# Patient Record
Sex: Female | Born: 1970 | Race: White | Hispanic: No | Marital: Married | Smoking: Current every day smoker
Health system: Southern US, Community
[De-identification: ages and names within clinical notes are randomized; demographics above are authoritative.]

## PROBLEM LIST (undated history)

## (undated) DIAGNOSIS — K589 Irritable bowel syndrome without diarrhea: Secondary | ICD-10-CM

## (undated) DIAGNOSIS — C439 Malignant melanoma of skin, unspecified: Secondary | ICD-10-CM

## (undated) DIAGNOSIS — K2970 Gastritis, unspecified, without bleeding: Secondary | ICD-10-CM

## (undated) DIAGNOSIS — T7840XA Allergy, unspecified, initial encounter: Secondary | ICD-10-CM

## (undated) DIAGNOSIS — I10 Essential (primary) hypertension: Secondary | ICD-10-CM

## (undated) DIAGNOSIS — M6740 Ganglion, unspecified site: Secondary | ICD-10-CM

## (undated) DIAGNOSIS — Z860100 Personal history of colon polyps, unspecified: Secondary | ICD-10-CM

## (undated) DIAGNOSIS — K5730 Diverticulosis of large intestine without perforation or abscess without bleeding: Secondary | ICD-10-CM

## (undated) HISTORY — PX: SPINAL FUSION: SHX223

## (undated) HISTORY — DX: Gastritis, unspecified, without bleeding: K29.70

## (undated) HISTORY — PX: CARPAL TUNNEL RELEASE: SHX101

## (undated) HISTORY — DX: Essential (primary) hypertension: I10

## (undated) HISTORY — DX: Allergy, unspecified, initial encounter: T78.40XA

## (undated) HISTORY — DX: Personal history of colonic polyps: Z86.010

## (undated) HISTORY — PX: FOOT SURGERY: SHX648

## (undated) HISTORY — DX: Diverticulosis of large intestine without perforation or abscess without bleeding: K57.30

## (undated) HISTORY — DX: Irritable bowel syndrome, unspecified: K58.9

## (undated) HISTORY — DX: Malignant melanoma of skin, unspecified: C43.9

## (undated) HISTORY — DX: Ganglion, unspecified site: M67.40

## (undated) HISTORY — PX: WISDOM TOOTH EXTRACTION: SHX21

## (undated) HISTORY — DX: Personal history of colon polyps, unspecified: Z86.0100

---

## 1997-10-29 HISTORY — PX: ABDOMINAL HYSTERECTOMY: SHX81

## 1998-10-05 ENCOUNTER — Inpatient Hospital Stay (HOSPITAL_COMMUNITY): Admission: RE | Admit: 1998-10-05 | Discharge: 1998-10-07 | Payer: Self-pay | Admitting: Gynecology

## 2001-02-25 ENCOUNTER — Other Ambulatory Visit: Admission: RE | Admit: 2001-02-25 | Discharge: 2001-02-25 | Payer: Self-pay | Admitting: Gynecology

## 2002-05-12 ENCOUNTER — Emergency Department (HOSPITAL_COMMUNITY): Admission: EM | Admit: 2002-05-12 | Discharge: 2002-05-12 | Payer: Self-pay | Admitting: Emergency Medicine

## 2002-09-21 ENCOUNTER — Other Ambulatory Visit: Admission: RE | Admit: 2002-09-21 | Discharge: 2002-09-21 | Payer: Self-pay | Admitting: Gynecology

## 2003-07-02 ENCOUNTER — Ambulatory Visit (HOSPITAL_COMMUNITY): Admission: RE | Admit: 2003-07-02 | Discharge: 2003-07-02 | Payer: Self-pay | Admitting: Orthopedic Surgery

## 2003-12-22 ENCOUNTER — Ambulatory Visit (HOSPITAL_COMMUNITY): Admission: RE | Admit: 2003-12-22 | Discharge: 2003-12-22 | Payer: Self-pay | Admitting: Internal Medicine

## 2004-01-05 ENCOUNTER — Other Ambulatory Visit: Admission: RE | Admit: 2004-01-05 | Discharge: 2004-01-05 | Payer: Self-pay | Admitting: Gynecology

## 2004-10-27 ENCOUNTER — Ambulatory Visit (HOSPITAL_COMMUNITY): Admission: RE | Admit: 2004-10-27 | Discharge: 2004-10-27 | Payer: Self-pay | Admitting: Family Medicine

## 2004-10-29 DIAGNOSIS — C439 Malignant melanoma of skin, unspecified: Secondary | ICD-10-CM

## 2004-10-29 HISTORY — PX: MELANOMA EXCISION: SHX5266

## 2004-10-29 HISTORY — DX: Malignant melanoma of skin, unspecified: C43.9

## 2005-01-05 ENCOUNTER — Emergency Department (HOSPITAL_COMMUNITY): Admission: EM | Admit: 2005-01-05 | Discharge: 2005-01-05 | Payer: Self-pay | Admitting: *Deleted

## 2005-01-08 ENCOUNTER — Ambulatory Visit (HOSPITAL_COMMUNITY): Admission: RE | Admit: 2005-01-08 | Discharge: 2005-01-08 | Payer: Self-pay | Admitting: *Deleted

## 2005-08-14 ENCOUNTER — Other Ambulatory Visit: Admission: RE | Admit: 2005-08-14 | Discharge: 2005-08-14 | Payer: Self-pay | Admitting: Gynecology

## 2005-11-08 ENCOUNTER — Ambulatory Visit: Payer: Self-pay | Admitting: Orthopedic Surgery

## 2005-11-28 ENCOUNTER — Ambulatory Visit: Payer: Self-pay | Admitting: Orthopedic Surgery

## 2005-12-07 ENCOUNTER — Encounter: Payer: Self-pay | Admitting: Orthopedic Surgery

## 2005-12-07 ENCOUNTER — Ambulatory Visit (HOSPITAL_COMMUNITY): Admission: RE | Admit: 2005-12-07 | Discharge: 2005-12-07 | Payer: Self-pay | Admitting: Orthopedic Surgery

## 2005-12-07 ENCOUNTER — Ambulatory Visit: Payer: Self-pay | Admitting: Orthopedic Surgery

## 2005-12-10 ENCOUNTER — Ambulatory Visit: Payer: Self-pay | Admitting: Orthopedic Surgery

## 2005-12-16 ENCOUNTER — Emergency Department (HOSPITAL_COMMUNITY): Admission: EM | Admit: 2005-12-16 | Discharge: 2005-12-16 | Payer: Self-pay | Admitting: Emergency Medicine

## 2005-12-19 ENCOUNTER — Ambulatory Visit: Payer: Self-pay | Admitting: Orthopedic Surgery

## 2005-12-24 ENCOUNTER — Ambulatory Visit: Payer: Self-pay | Admitting: Orthopedic Surgery

## 2006-01-07 ENCOUNTER — Ambulatory Visit: Payer: Self-pay | Admitting: Orthopedic Surgery

## 2006-01-23 ENCOUNTER — Ambulatory Visit (HOSPITAL_COMMUNITY): Admission: RE | Admit: 2006-01-23 | Discharge: 2006-01-23 | Payer: Self-pay | Admitting: Family Medicine

## 2006-02-11 ENCOUNTER — Ambulatory Visit: Payer: Self-pay | Admitting: Orthopedic Surgery

## 2006-03-28 ENCOUNTER — Ambulatory Visit: Payer: Self-pay | Admitting: Internal Medicine

## 2006-09-24 ENCOUNTER — Encounter: Admission: RE | Admit: 2006-09-24 | Discharge: 2006-09-24 | Payer: Self-pay | Admitting: Orthopaedic Surgery

## 2006-10-30 ENCOUNTER — Ambulatory Visit (HOSPITAL_COMMUNITY): Admission: RE | Admit: 2006-10-30 | Discharge: 2006-10-30 | Payer: Self-pay | Admitting: Family Medicine

## 2007-01-08 ENCOUNTER — Ambulatory Visit (HOSPITAL_COMMUNITY): Admission: RE | Admit: 2007-01-08 | Discharge: 2007-01-08 | Payer: Self-pay | Admitting: Neurosurgery

## 2007-01-31 ENCOUNTER — Inpatient Hospital Stay (HOSPITAL_COMMUNITY): Admission: RE | Admit: 2007-01-31 | Discharge: 2007-02-04 | Payer: Self-pay | Admitting: Neurosurgery

## 2007-11-11 ENCOUNTER — Ambulatory Visit (HOSPITAL_COMMUNITY): Admission: RE | Admit: 2007-11-11 | Discharge: 2007-11-11 | Payer: Self-pay | Admitting: Family Medicine

## 2008-03-03 ENCOUNTER — Encounter: Admission: RE | Admit: 2008-03-03 | Discharge: 2008-03-03 | Payer: Self-pay | Admitting: Neurosurgery

## 2008-06-28 ENCOUNTER — Encounter (HOSPITAL_COMMUNITY): Admission: RE | Admit: 2008-06-28 | Discharge: 2008-07-28 | Payer: Self-pay | Admitting: Oncology

## 2008-06-28 ENCOUNTER — Ambulatory Visit (HOSPITAL_COMMUNITY): Payer: Self-pay | Admitting: Oncology

## 2008-08-06 ENCOUNTER — Encounter (HOSPITAL_COMMUNITY): Admission: RE | Admit: 2008-08-06 | Discharge: 2008-09-05 | Payer: Self-pay | Admitting: Oncology

## 2008-08-30 ENCOUNTER — Emergency Department (HOSPITAL_COMMUNITY): Admission: EM | Admit: 2008-08-30 | Discharge: 2008-08-30 | Payer: Self-pay | Admitting: Emergency Medicine

## 2008-09-15 IMAGING — CT CT L SPINE W/O CM
2 of 9 series · 8 of 30 positions shown, 9 images · IV contrast (agent unspecified)
Comparison: MR 01/08/2007.  
Last fully open disk space was labeled L5-S1.

CLINICAL DATA: Low back pain radiating down left hip and leg.  
LUMBAR SPINE CT WITHOUT CONTRAST:
TECHNIQUE: Multidetector CT imaging of the lumbar spine was performed.  Multiplanar CT image reconstructions were also generated.

[Series 3: recon 2: l-spine helical · axial · 0.27mm/px · z∈[-293,-230]mm · 2 of 76 slices shown, 3 images]
[im 26/76  soft-tissue]
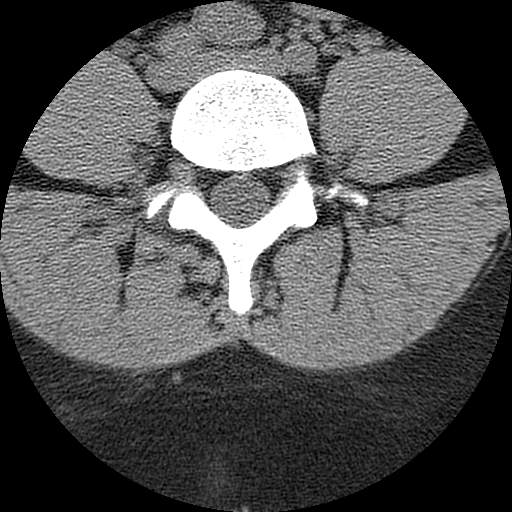
[im 26/76  bone]
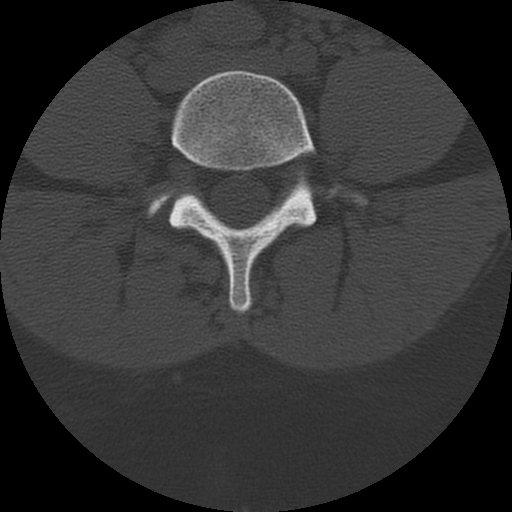
[im 51/76  bone]
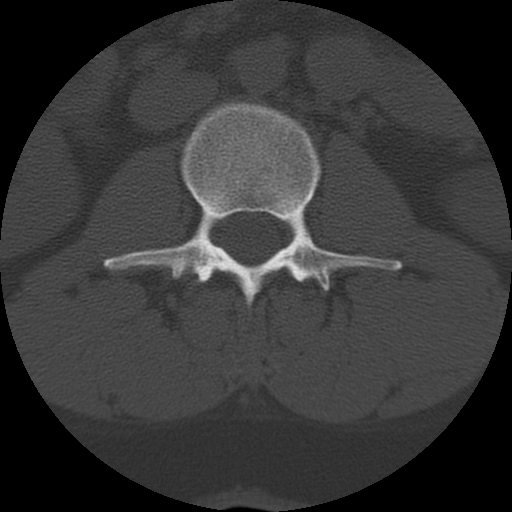

[Series 401: coronal l -spine · coronal · 0.37mm/px · 6 of 40 slices shown]
[im 12/40  soft-tissue]
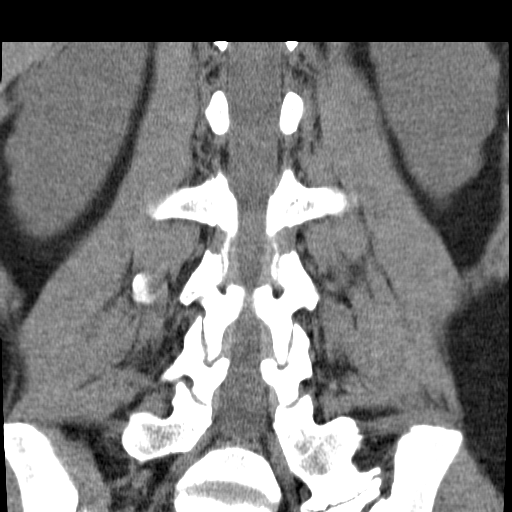
[im 14/40  bone]
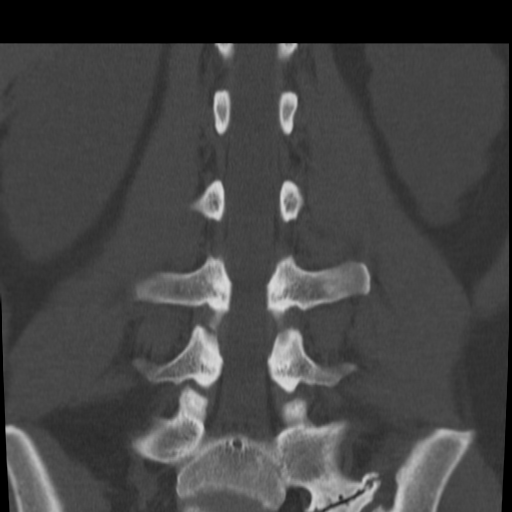
[im 17/40  bone]
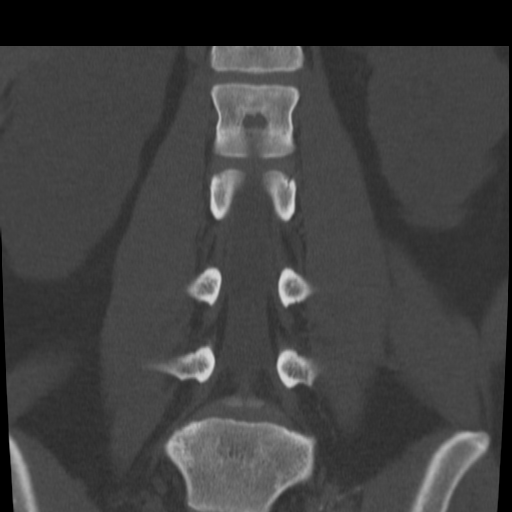
[im 20/40  bone]
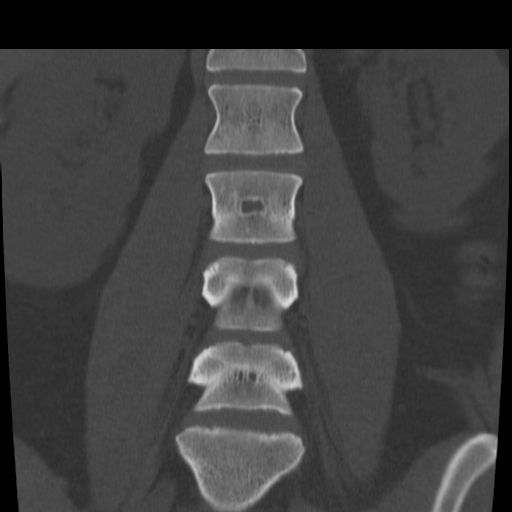
[im 23/40  bone]
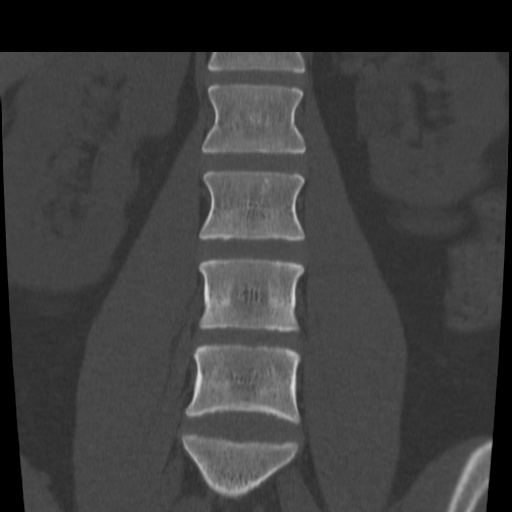
[im 27/40  bone]
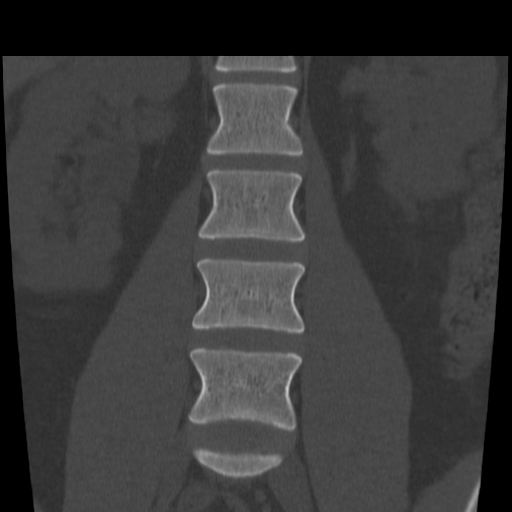

[8 of 30 positions shown; findings below may reference images not displayed]

FINDINGS: The decreased signal intensity of bone marrow noted on pre-contrast T1-weighted MR imaging is not appreciated on the present CT scan.  One could correlate with CBC and differential to exclude anemia or infiltrative process.  This appearance may be explained by the patient?s habitus.  The rounded bony lesions within the L3 and L4 vertebral body also better delineated on MR. These do not have characteristics of typical hemangiomas but possibly represent atypical hemangiomas.  
No pars defect. The left aspect of the L5 vertebra articulates with the upper aspect of the sacrum with broad-based osteophyte.  This may contribute to the patient?s symptoms.  The exiting left L5 nerve root is immediately adjacent to this region but does not appear compressed.  
T12-L1 through L3-4 unremarkable.  
L4-5:
IMPRESSION: 1.  Left aspect of L5 articulates with the upper sacrum with bony overgrowth at this articulation potentially contributing to patient?s discomfort.
2.  No disk herniation.  
3.  Slight altered signal of bone marrow better visualized on recent MR scan as described above.

## 2008-09-27 ENCOUNTER — Ambulatory Visit (HOSPITAL_COMMUNITY): Payer: Self-pay | Admitting: Oncology

## 2008-09-27 ENCOUNTER — Encounter (HOSPITAL_COMMUNITY): Admission: RE | Admit: 2008-09-27 | Discharge: 2008-10-26 | Payer: Self-pay | Admitting: Oncology

## 2008-11-30 ENCOUNTER — Inpatient Hospital Stay (HOSPITAL_COMMUNITY): Admission: EM | Admit: 2008-11-30 | Discharge: 2008-12-02 | Payer: Self-pay | Admitting: Emergency Medicine

## 2008-12-01 ENCOUNTER — Ambulatory Visit: Payer: Self-pay | Admitting: Gastroenterology

## 2008-12-02 ENCOUNTER — Encounter: Payer: Self-pay | Admitting: Internal Medicine

## 2008-12-02 ENCOUNTER — Ambulatory Visit: Payer: Self-pay | Admitting: Internal Medicine

## 2008-12-23 ENCOUNTER — Encounter: Payer: Self-pay | Admitting: Gastroenterology

## 2009-01-03 ENCOUNTER — Ambulatory Visit (HOSPITAL_COMMUNITY): Admission: RE | Admit: 2009-01-03 | Discharge: 2009-01-03 | Payer: Self-pay | Admitting: Family Medicine

## 2009-01-17 DIAGNOSIS — Z860100 Personal history of colon polyps, unspecified: Secondary | ICD-10-CM | POA: Insufficient documentation

## 2009-01-17 DIAGNOSIS — I10 Essential (primary) hypertension: Secondary | ICD-10-CM | POA: Insufficient documentation

## 2009-01-18 ENCOUNTER — Ambulatory Visit: Payer: Self-pay | Admitting: Internal Medicine

## 2009-01-18 DIAGNOSIS — Z8719 Personal history of other diseases of the digestive system: Secondary | ICD-10-CM | POA: Insufficient documentation

## 2009-01-26 ENCOUNTER — Encounter: Payer: Self-pay | Admitting: Internal Medicine

## 2009-01-26 ENCOUNTER — Telehealth (INDEPENDENT_AMBULATORY_CARE_PROVIDER_SITE_OTHER): Payer: Self-pay | Admitting: *Deleted

## 2009-01-26 DIAGNOSIS — Z9189 Other specified personal risk factors, not elsewhere classified: Secondary | ICD-10-CM | POA: Insufficient documentation

## 2009-02-01 ENCOUNTER — Ambulatory Visit (HOSPITAL_COMMUNITY): Admission: RE | Admit: 2009-02-01 | Discharge: 2009-02-01 | Payer: Self-pay | Admitting: Internal Medicine

## 2009-04-01 ENCOUNTER — Ambulatory Visit (HOSPITAL_COMMUNITY): Payer: Self-pay | Admitting: Oncology

## 2009-05-06 ENCOUNTER — Encounter (HOSPITAL_COMMUNITY): Admission: RE | Admit: 2009-05-06 | Discharge: 2009-06-05 | Payer: Self-pay | Admitting: Oncology

## 2009-07-12 ENCOUNTER — Encounter: Admission: RE | Admit: 2009-07-12 | Discharge: 2009-07-12 | Payer: Self-pay | Admitting: Neurosurgery

## 2009-08-02 ENCOUNTER — Encounter: Admission: RE | Admit: 2009-08-02 | Discharge: 2009-08-02 | Payer: Self-pay | Admitting: Neurosurgery

## 2009-11-14 ENCOUNTER — Ambulatory Visit (HOSPITAL_COMMUNITY): Admission: RE | Admit: 2009-11-14 | Discharge: 2009-11-14 | Payer: Self-pay | Admitting: Family Medicine

## 2010-03-06 ENCOUNTER — Encounter (HOSPITAL_COMMUNITY): Admission: RE | Admit: 2010-03-06 | Discharge: 2010-04-05 | Payer: Self-pay | Admitting: Oncology

## 2010-03-06 ENCOUNTER — Ambulatory Visit (HOSPITAL_COMMUNITY): Payer: Self-pay | Admitting: Internal Medicine

## 2010-05-08 IMAGING — CR DG CHEST 2V
2 series · 2 of 2 positions shown · non-contrast
Comparison: 07/08/2008

CLINICAL DATA: Chest pain and shortness of breath.

CHEST - 2 VIEW

[w chest pa]
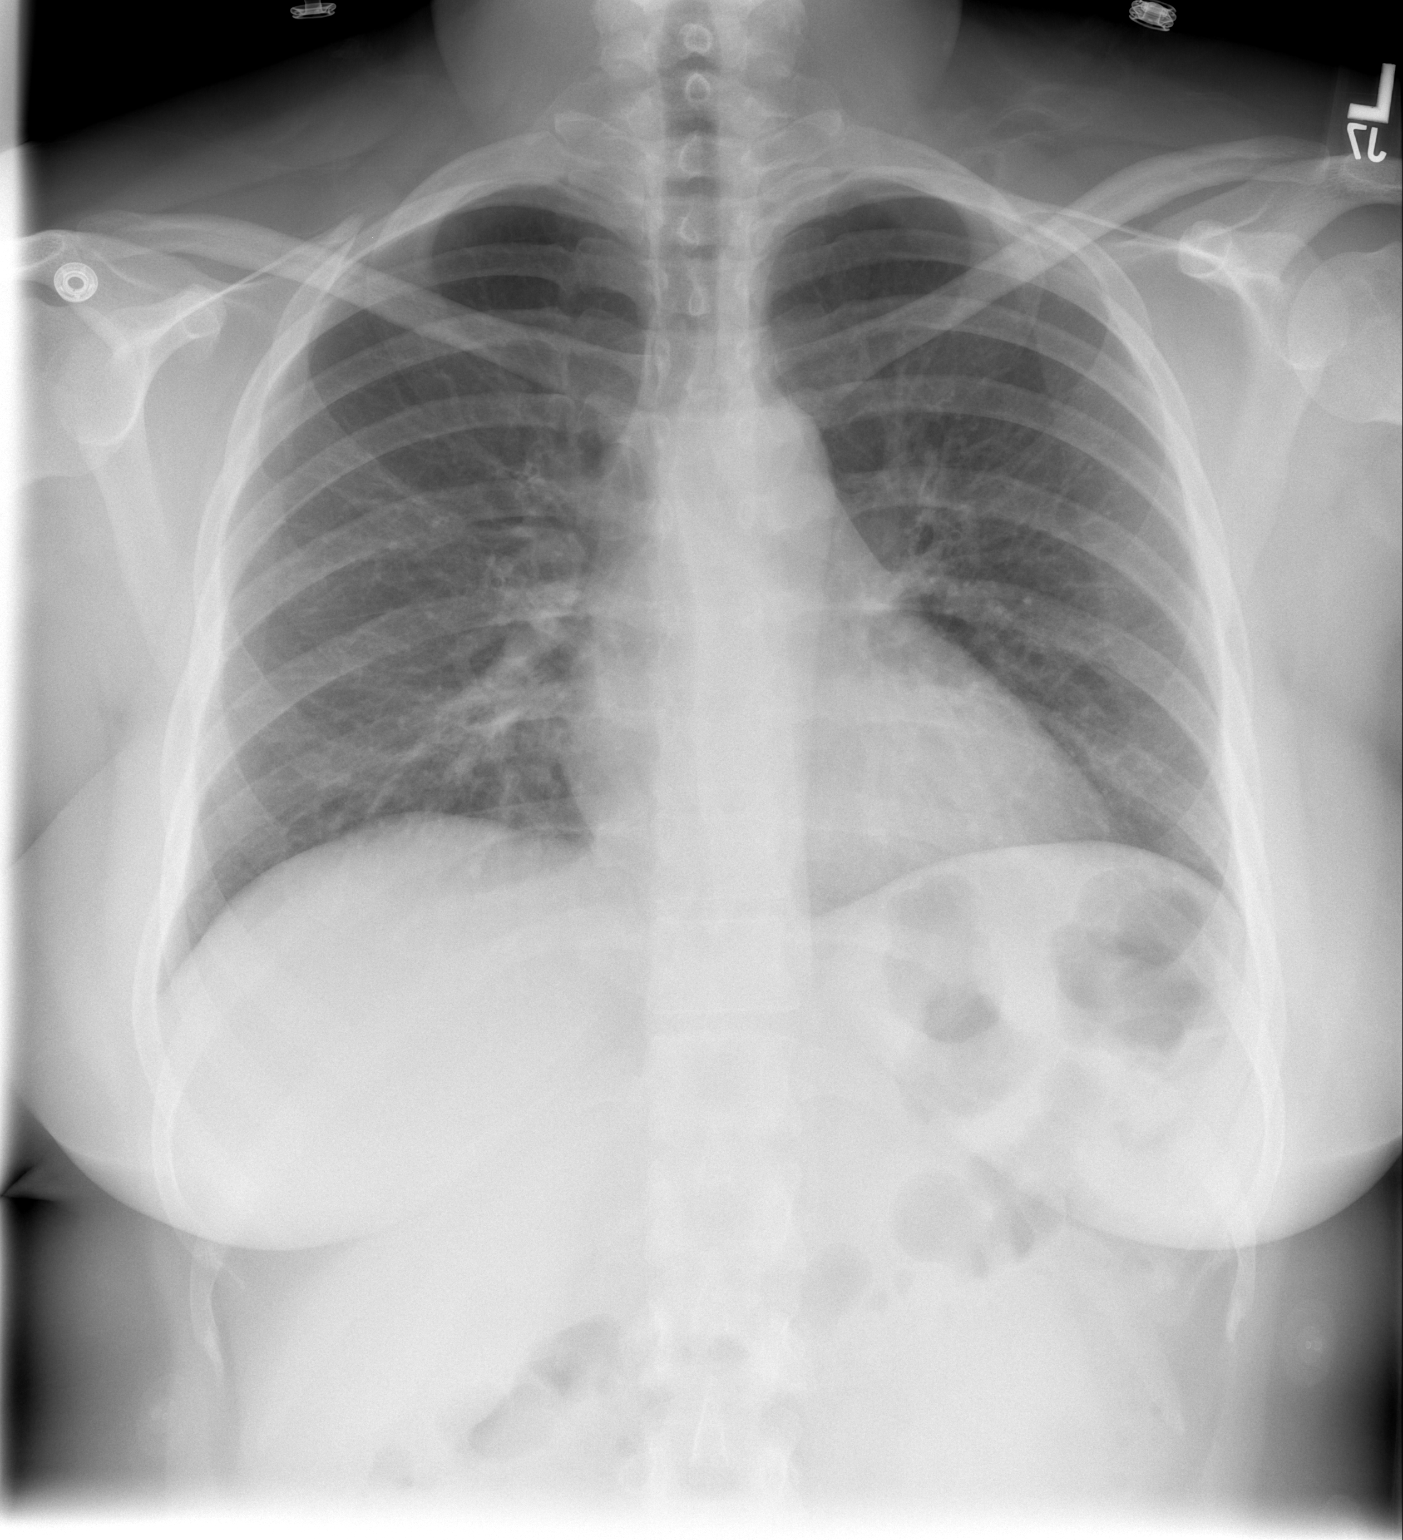

[w chest lat]
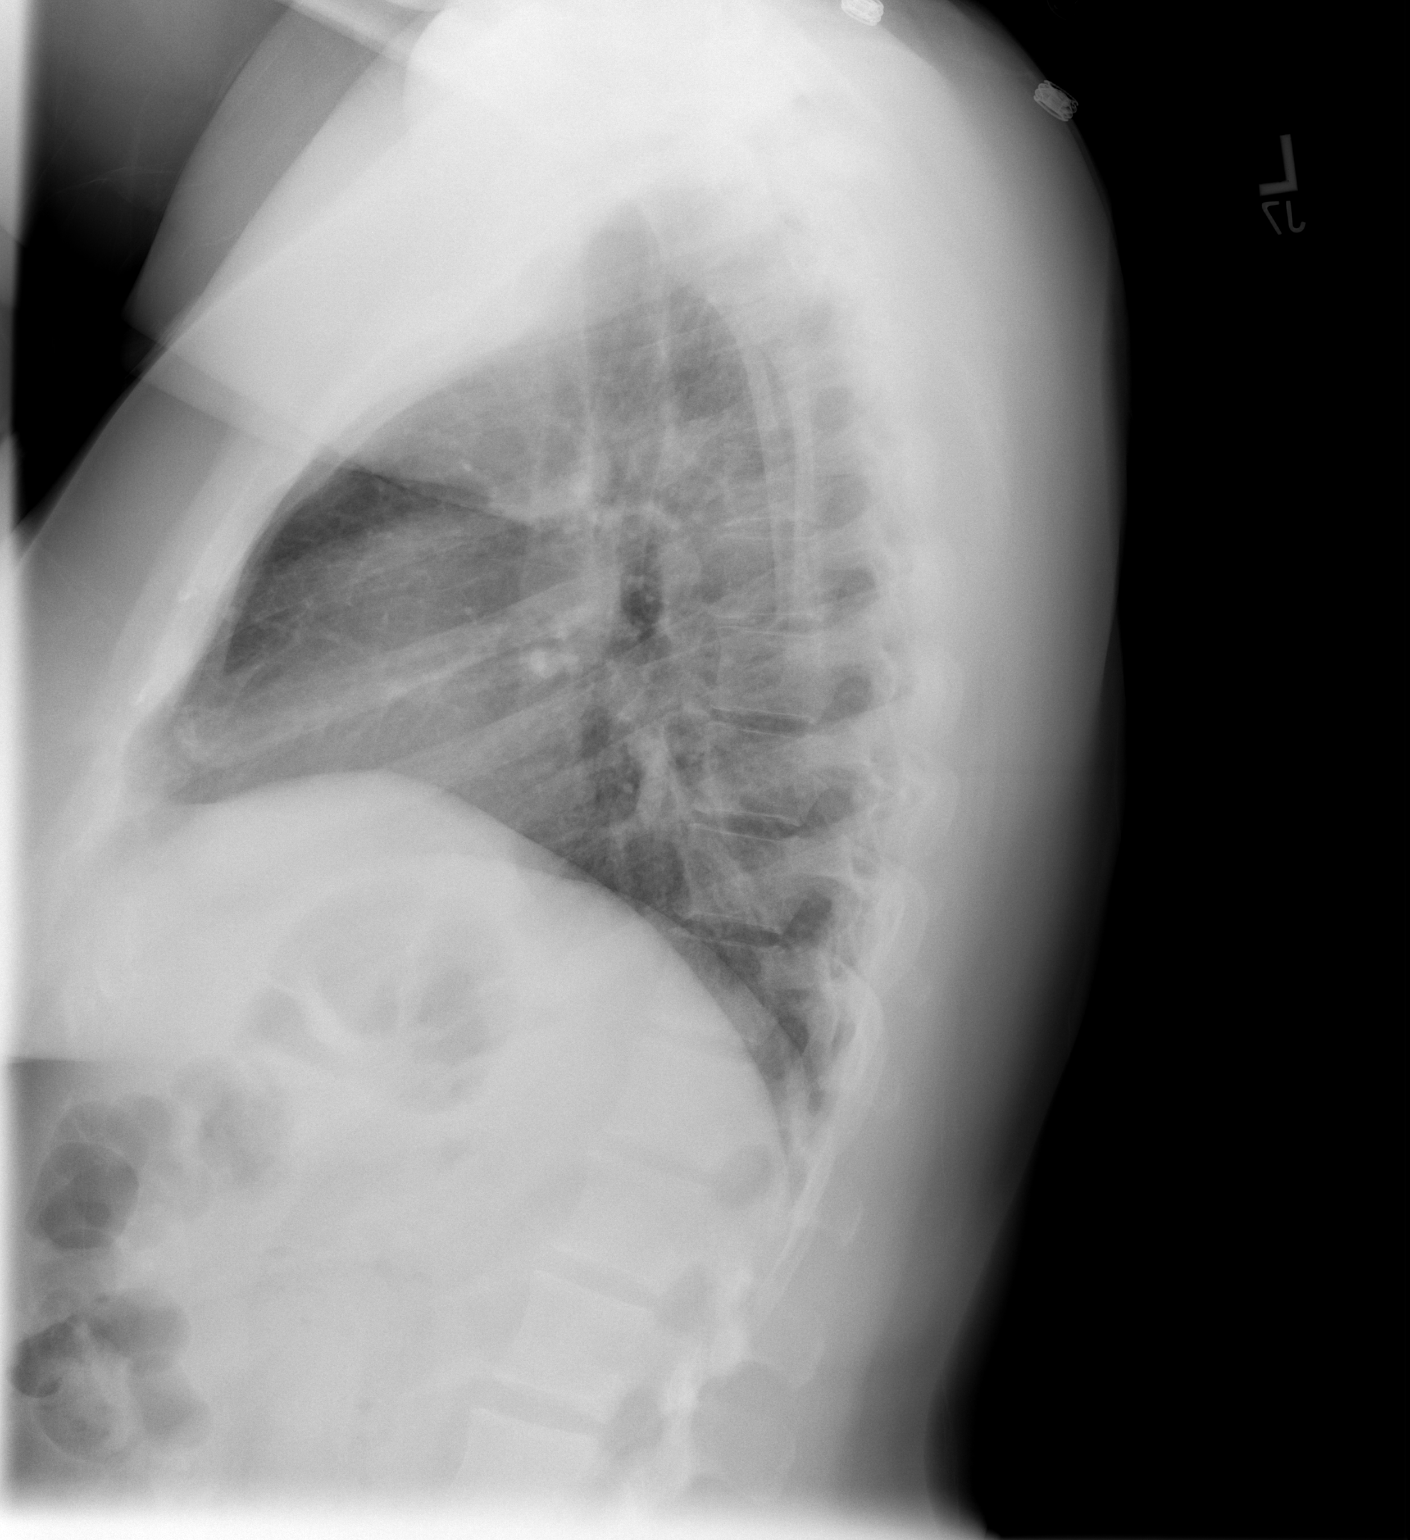

[2 of 2 positions shown; findings below may reference images not displayed]

FINDINGS: Trachea is midline.  Heart size normal.  Lungs are
somewhat low in volume but clear.  No pleural fluid.
IMPRESSION: No acute findings.

## 2010-11-19 ENCOUNTER — Encounter: Payer: Self-pay | Admitting: Neurosurgery

## 2010-11-20 ENCOUNTER — Encounter: Payer: Self-pay | Admitting: Neurosurgery

## 2011-01-16 LAB — DIFFERENTIAL
Lymphocytes Relative: 32 % (ref 12–46)
Lymphs Abs: 3 10*3/uL (ref 0.7–4.0)
Monocytes Absolute: 0.7 10*3/uL (ref 0.1–1.0)
Monocytes Relative: 8 % (ref 3–12)
Neutro Abs: 5.3 10*3/uL (ref 1.7–7.7)

## 2011-01-16 LAB — CBC
HCT: 43.1 % (ref 36.0–46.0)
Hemoglobin: 15.1 g/dL — ABNORMAL HIGH (ref 12.0–15.0)
RBC: 4.79 MIL/uL (ref 3.87–5.11)

## 2011-02-04 LAB — CBC
HCT: 41.5 % (ref 36.0–46.0)
Hemoglobin: 14.4 g/dL (ref 12.0–15.0)
MCHC: 34.6 g/dL (ref 30.0–36.0)
MCV: 91.2 fL (ref 78.0–100.0)
RDW: 13.8 % (ref 11.5–15.5)

## 2011-02-04 LAB — DIFFERENTIAL
Basophils Absolute: 0.1 10*3/uL (ref 0.0–0.1)
Basophils Relative: 1 % (ref 0–1)
Eosinophils Absolute: 0.1 10*3/uL (ref 0.0–0.7)
Eosinophils Relative: 1 % (ref 0–5)
Lymphocytes Relative: 24 % (ref 12–46)
Monocytes Absolute: 0.7 10*3/uL (ref 0.1–1.0)

## 2011-02-13 LAB — URINALYSIS, ROUTINE W REFLEX MICROSCOPIC
Leukocytes, UA: NEGATIVE
Nitrite: NEGATIVE
Specific Gravity, Urine: 1.01 (ref 1.005–1.030)
pH: 5.5 (ref 5.0–8.0)

## 2011-02-13 LAB — DIFFERENTIAL
Basophils Absolute: 0 10*3/uL (ref 0.0–0.1)
Basophils Relative: 0 % (ref 0–1)
Eosinophils Relative: 1 % (ref 0–5)
Lymphocytes Relative: 20 % (ref 12–46)
Lymphocytes Relative: 24 % (ref 12–46)
Monocytes Absolute: 0.7 10*3/uL (ref 0.1–1.0)
Monocytes Relative: 5 % (ref 3–12)
Neutro Abs: 10.3 10*3/uL — ABNORMAL HIGH (ref 1.7–7.7)
Neutro Abs: 9.8 10*3/uL — ABNORMAL HIGH (ref 1.7–7.7)

## 2011-02-13 LAB — URINE MICROSCOPIC-ADD ON

## 2011-02-13 LAB — BASIC METABOLIC PANEL
BUN: 10 mg/dL (ref 6–23)
Calcium: 8.9 mg/dL (ref 8.4–10.5)
GFR calc non Af Amer: >60 mL/min (ref >60)
Glucose, Bld: 102 mg/dL — ABNORMAL HIGH (ref 70–99)

## 2011-02-13 LAB — CBC
HCT: 36.2 % (ref 36.0–46.0)
HCT: 42.5 % (ref 36.0–46.0)
Platelets: 315 10*3/uL (ref 150–400)
Platelets: 338 10*3/uL (ref 150–400)
RDW: 13.3 % (ref 11.5–15.5)
RDW: 13.4 % (ref 11.5–15.5)

## 2011-02-13 LAB — COMPREHENSIVE METABOLIC PANEL
Albumin: 3.9 g/dL (ref 3.5–5.2)
Alkaline Phosphatase: 84 U/L (ref 39–117)
BUN: 10 mg/dL (ref 6–23)
Potassium: 3.3 mEq/L — ABNORMAL LOW (ref 3.5–5.1)
Total Protein: 6.8 g/dL (ref 6.0–8.3)

## 2011-02-13 LAB — APTT: aPTT: 38 seconds — ABNORMAL HIGH (ref 24–37)

## 2011-02-13 LAB — POCT I-STAT 4, (NA,K, GLUC, HGB,HCT)
HCT: 41 % (ref 36.0–46.0)
Sodium: 142 mEq/L (ref 135–145)

## 2011-03-13 NOTE — H&P (Signed)
NAMESHERENE, PLANCARTE                 ACCOUNT NO.:  [id number]   MEDICAL RECORD NO.:  [id number]          PATIENT TYPE:  INP   LOCATION:  A321                          FACILITY:  [facility]   PHYSICIAN:  Margaretmary Dys, M.D.DATE OF BIRTH:  10-21-1971   DATE OF ADMISSION:  11/30/2008  DATE OF DISCHARGE:  LH                              HISTORY & PHYSICAL   PRIMARY CARE PHYSICIAN:  Unassigned.   ADMISSION DIAGNOSIS:  Gastrointestinal bleed.   CHIEF COMPLAINT:  Bright red blood per rectum x1 day.   HISTORY OF PRESENT ILLNESS:  Rhonda Porter Porter is a 40 year old female who  presented to the emergency room with a history of one episode of bright  red blood this afternoon.   She reports going to the bathroom and the feeling an urge to go.  She  thought she had diarrhea.  When she looked and in toilet bowl, she saw  bright red blood.  She estimated it to be about 3-4 ounces.  She had no  abdominal cramping.  She denied any pain.  This is her first episode.  She has never had bleeding per rectum.  She denies any dizziness or  lightheadedness.  She had no abdominal pain.  The patient called her  husband and he came to the emergency room.  Since being in the emergency  room, the patient has not had any more episodes of bleeding.  The  patient gives a history of chronic constipation of several years for  which she takes Fiber One.  She also reports taking Advil for back pain.   The patient denies any history of melena stools or hematemesis.  The  patient has never been told she is anemic for no clear reason.  She  denies any weight loss.  She has no night sweats.  No fevers or chills.   Evaluation in the emergency room revealed the patient was  hemodynamically stable.  Hemoglobin and hematocrit was also pretty  normal.  The patient has now been admitted for further evaluation.   REVIEW OF SYSTEMS:  A 10-point review of systems otherwise negative,  except as mentioned in the history of present  illness.   PAST MEDICAL HISTORY:  1. 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      The patient is being followed by Dr. Mariel Sleet.  2. 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  3. History of breast biopsy which was noted to be benign.  4. History of partial hysterectomy in 1999, secondary to severe      endometriosis.   MEDICATIONS:  1. Fiber One as needed.  2. Advil orally.   ALLERGIES:  NO KNOWN DRUG ALLERGIES.   FAMILY HISTORY:  Positive for grandmother with breast cancer.  No  history of colon cancer or bowel cancer in the family.   SOCIAL HISTORY:  The patient is married, has two children, a 39 year old  and a 71 year old.  She has been married for 13 years and she smokes  about three-quarters of a pack of cigarettes a day.  She denies any  alcohol use.  Denies any illicit drug use.  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   PHYSICAL EXAMINATION:  GENERAL:  The patient was conscious, alert,  comfortable, not in acute distress.  Well oriented in time, place and  person.  VITAL SIGNS:  On arrival in the emergency room, her blood pressure was  155/100, pulse was 120, respirations 20, temperature 97.3 degrees  Fahrenheit.  Oxygen saturation was 99% on room air.  HEENT:  Normocephalic, atraumatic.  Oral mucosa was moist with no exudates.  NECK:  Supple.  No JVD or lymphadenopathy.  LUNGS:  Clear clinically with good air entry bilaterally.  HEART:  S1-S2 regular.  No exudates, gallops or rubs.  ABDOMEN:  Soft, nontender.  Bowel sounds positive.  No masses palpable.  EXTREMITIES:  No pitting pedal edema.  No calf induration or tenderness  was noted.  CNS:  Grossly intact with no focal neurological deficits.   LABORATORY/DIAGNOSTIC DATA:  White blood cell count 14.2, hemoglobin of  14.5, hematocrit 42.5, platelet count was 338.  She was  normocytic/normochromic.  Platelet count was 338.  PT was 13.1, INR  was  1.0, PTT was borderline elevated at 38.  Sodium is 138, potassium 3.3,  chloride of 105, CO2 was 25, glucose 113, BUN of 10, creatinine was 0.6,  AST 17, ALT of 15, calcium is 9.4.  Urinalysis showed a large amount of  blood with some epithelial cells and few bacteria.   ASSESSMENT/PLAN:  This is a 40 year old female presenting with acute  gastrointestinal bleed, most likely lower gastrointestinal bleed.  Possibilities include;  1. Internal hemorrhoidal bleed as the patient did not have any      evidence of external hemorrhoids on rectal exam as per the      emergency room doctor  2. Diverticulosis with a history of chronic constipation.  3. Doubt the presence of a malignancy, but cannot be entirely ruled      out, especially in a patient with history of malignant melanoma      back in 2004.   PLAN:  1. The patient will be admitted to the medical floor.  2. She remains hemodynamically stable.  Will put on IV fluids of      normal saline at 75 mL an hour.  3. Will replace her potassium.  4. Will keep n.p.o. from midnight.  5. Will request Dr. Jena Gauss to see her in the morning.  Gastroenterology      to see her in the morning for evaluation of possible colonoscopy.  6. Will put on DVT prophylaxis with sequential compression devices for      GI prophylaxis with Protonix twice a day.  7. Will discontinue the patient's Advil for now.  8. Will give the patient a nicotine patch as needed.   CODE STATUS:  The patient is a full code.   DISPOSITION:  The patient will remain in the hospital until further  evaluation by gastroenterology.  I have explained the above plan to her  and her husband and they both verbalized full understanding.      Margaretmary Dys, M.D.  Electronically Signed     AM/MEDQ  D:  12/01/2008  T:  12/01/2008  Job:  [id number]

## 2011-03-13 NOTE — Discharge Summary (Signed)
[name], Rhonda Porter                 ACCOUNT NO.:  [id number]   MEDICAL RECORD NO.:  [id number]          PATIENT TYPE:  INP   LOCATION:  A321                          FACILITY:  [facility]   PHYSICIAN:  Dorris Singh, DO    DATE OF BIRTH:  08/11/1971   DATE OF ADMISSION:  11/30/2008  DATE OF DISCHARGE:  02/04/2010LH                               DISCHARGE SUMMARY   ADMISSION DIAGNOSES:  Include:  1. Acute gastrointestinal bleed.  2. __________.   DISCHARGE DIAGNOSES:  1. Left-sided diverticula and a polypectomy and resolved lower      gastrointestinal bleeding.   PRIMARY CARE DOCTOR:  She is unassigned.   The patient's testing that was done includes none.   OPERATIVE REPORT:  The patient had an ileocolonoscopy with snare  polypectomy and hemostasis therapy on December 02, 2008.   HOSPITAL COURSE:  The patient is a 40 year old Caucasian female who was  admitted to the service of  [facility] with the above diagnosis.  She  presented to the emergency room with 1 episode of bright red blood per  rectum.  There was some concern of possible she may have internal  hemorrhoids.  She was prepped for colonoscopy but failed the prep and  had to be reprepped the next day on which she had a colonoscopy with  polypectomy done. GI was also consulted on this case.  When she was  admitted for GI bleed we placed her on IV fluids.  We discontinued all  anticoagulation and put her on DVT and GI prophylaxis that was  appropriate and GI was consulted.  Once GI saw her they went ahead and  did the colonoscopy. Their recommendations were for her not to use any  NSAIDs or aspirin for 7 days and then high fiber diet in 1 week check a  CBC. They wanted her to be discharged on December 03, 2008 and to follow  up on pathology with his office.  Apparently Dr. Jena Gauss had spoken with  them earlier and he stated they could probably go home today and the  patient could not wait for me to get back to see her for her  discharge.  At that point in time they decided to sign out AMA.  She felt  comfortable with instructions that were given to her per nurse so they  signed out AMA with the following instructions.  They would decrease  diet x7 days and they increased fiber diet.  No aspirin x7 days and  follow up with primary in 1 week. For her medications she was not placed  on any. And as mentioned before unable to do disposition and unable to  do condition.      Dorris Singh, DO  Electronically Signed    CB/MEDQ  D:  01/12/2009  T:  01/13/2009  Job:  [id number]

## 2011-03-13 NOTE — Group Therapy Note (Signed)
Rhonda Porter, SILVERNAIL NO.:  [id number]   MEDICAL RECORD NO.:  [id number]          PATIENT TYPE:  INP   LOCATION:  A321                          FACILITY:  [facility]   PHYSICIAN:  Dorris Singh, DO    DATE OF BIRTH:  August 10, 1971   DATE OF PROCEDURE:  DATE OF DISCHARGE:                                 PROGRESS NOTE   The patient was seen today with her husband in the room.  She was  scheduled for a colonoscopy, however, she did not do the prep well, so  she will complete the prep tonight and she will have a colonoscopy  tomorrow.  She did have an episode of bleeding while she was doing the  preparation.   Her vitals are as follows:  Temperature 97.6, pulse 99, respirations 16,  blood pressure 117/62.  The patient is a Caucasian female, well developed, well nourished, in no  acute distress.  HEART:  Regular rate and rhythm.  LUNGS:  Clear to auscultation bilaterally.  ABDOMEN:  Soft, nontender, nondistended.  EXTREMITIES:  Positive pulses.   White count of 14.0, hemoglobin 12.3, hematocrit 36.2, platelet count of  315.  Her BMET is normal with a glucose of 102.   ASSESSMENT AND PLAN:  Rectal bleeding.  The patient will have a  colonoscopy tomorrow to rule out the possibility of internal  hemorrhoids.  Will continue with DVT and GI prophylaxis as appropriate.  Will hold all anticoagulants on the patient.  Will continue to monitor  and change therapies as necessary.      Dorris Singh, DO  Electronically Signed     CB/MEDQ  D:  12/01/2008  T:  12/01/2008  Job:  [id number]

## 2011-03-13 NOTE — Consult Note (Signed)
NAME:  Rhonda Porter, RYBACK NO.:  [id number]   MEDICAL RECORD NO.:  [id number]          PATIENT TYPE:  INP   LOCATION:  A321                          FACILITY:  [facility]   PHYSICIAN:  Kassie Mends, M.D.      DATE OF BIRTH:  Nov 15, 1970   DATE OF CONSULTATION:  DATE OF DISCHARGE:                                 CONSULTATION   REFERRING PHYSICIAN:  [facility].   REASON FOR CONSULTATION:  Rectal bleeding.   HISTORY OF PRESENT ILLNESS:  [name] is a 40 year old, Caucasian  female.  Yesterday evening, she felt like she had to go to the bathroom.  By the time she got there, she had a large amount of bright red blood  without clots in her underwear as well as in the commode.  She has never  had any rectal bleeding previously.  She denies any abdominal pain with  this.  She has history of alternating between chronic constipation where  she can go 3-4 days without a bowel movement, at times she has even gone  up to 2 weeks.  This does alter with diarrhea where she can have loose  stools 4-5 times in 1 day.  She denies any fever or chills.  She denies  any nausea or vomiting and denies any chest pain, palpitations,  dizziness, shortness of breath.  She denies any history of GERD or upper  GI symptoms.  Denies any anorexia or fatigue.  Her weight has remained  stable.  She is taking ibuprofen 800 mg b.i.d. to t.i.d. p.r.n. a couple  of times per week, usually on the weekends when she is more mobile.  Hemoglobin is 12.3 and was 14.5 on admission.  She also has a history of  leukocytosis, current white blood cell count is 14,000 with an ANC of  10,300.   PAST MEDICAL AND SURGICAL HISTORY:  1. She had a hysterectomy in 1999.  2. She had melanoma in 2006 which was removed from her left shoulder.      She was followed every 6 months by Dr. Margo Aye.  3. She has had back surgery including a spinal fusion and she has had      rods and screws placed in her back.  4. She has history  of ganglion cyst and carpal tunnel release.  5. She has history of leukocytosis being followed by Dr. Mariel Sleet,      etiology unknown.  6. She has had a benign breast biopsy.   CURRENT MEDICATION:  Ibuprofen 800 mg up to t.i.d. daily p.r.n.   ALLERGIES:  NO KNOWN DRUG ALLERGIES.   FAMILY HISTORY:  There is no known family history of colon carcinoma,  inflammatory bowel disease or other GI problems.  Mother is 32 and has a  history of hypertension.  Father is 71 and healthy.  She has 1 healthy  brother.   SOCIAL HISTORY:  She has 2 healthy children ages 4 and 67.  She has  been married for 12 years.  She has an almost 20 pack-year history of  tobacco use and currently  smokes about 3/4-pack daily.  She denies any  alcohol or drug use.   REVIEW OF SYSTEMS:  See HPI, otherwise negative.   PHYSICAL EXAM:  VITAL SIGNS:  Temperature 97.8, pulse 97, respirations  20, blood pressure 138/88, O2 sat 98% on room air, weight is 185 kg,  height 60 inches.  GENERAL:  She is a well-developed, obese, Caucasian female who was  alert, oriented, pleasant and cooperative, in no acute distress.  She is  accompanied by her husband, her mother and her father this morning.  HEENT:  Sclerae clear, nonicteric, conjunctivae pink, oropharynx pink  and moist without any lesions.  NECK:  Supple without mass or thyromegaly.  CHEST:  Heart regular rate and rhythm, normal S1-S2, no murmurs, clicks,  rubs or gallops.  LUNGS:  Clear to auscultation bilaterally.  ABDOMEN:  Positive bowel sounds x4, no bruits auscultated, soft,  nontender, nondistended without palpable mass or hepatosplenomegaly.  No  rebound tenderness, or guarding.  EXTREMITIES:  Without clubbing or edema.  RECTAL:  There are no external lesions visualized.  Internal exam:  She  has good sphincter tone, no internal masses palpated.  She does have a  small amount of soft stool in the vault.  Upon exam of the glove post-  exam, she does have  some dark dried blood in the vault.   LABORATORY STUDIES:  Hematocrit 36.2, platelet count 315, INR 1, sodium  140, potassium 3.9, chloride 105, CO2 25, glucose 102, BUN 10,  creatinine 0.67, calcium 8.9, total bilirubin 0.2, alkaline phosphatase  84, AST 17, ALT 15, total protein 6.8, albumin 3.9.  Urinalysis shows a  large amount of blood, few squamous epithelial cells and bacteria, red  blood cells and white blood cells.   IMPRESSION:  [name] is a 40 year old Caucasian female with history  of melanoma with acute onset large volume rectal bleeding in the setting  of chronic constipation alternating with diarrhea and significant  amounts of ibuprofen use.  I suspect non-steroidal anti-inflammatory  drug-induced colitis, diverticular bleeding, hemorrhoidal bleeding,  ischemia, fissure, or less likely colorectal carcinoma or inflammatory  bowel disease.   PLAN:  1. Colonoscopy with Dr. Cira Servant today.  I have discussed risks and      benefits which include but are not limited to bleeding, infection,      perforation and drug reaction.  She agrees with this plan and      consent will be obtained.  2. We will continue supportive measures.  3. She is to be NPO except for her medications and prep which is to      begin this morning.   We would like to thank the [facility] for allowing [place] to  participate in the care of [name].   ADDENDUM 56433:  Diverticular bleed. Hyperplastic polyp.      Lorenza Burton, N.P.      Kassie Mends, M.D.  Electronically Signed    KJ/MEDQ  D:  12/01/2008  T:  12/01/2008  Job:  [id number]

## 2011-03-13 NOTE — Op Note (Signed)
Rhonda Porter, Rhonda Porter                 ACCOUNT NO.:  [id number]   MEDICAL RECORD NO.:  [id number]          PATIENT TYPE:  INP   LOCATION:  A321                          FACILITY:  [facility]   PHYSICIAN:  R. Roetta Sessions, M.D. DATE OF BIRTH:  May 01, 1971   DATE OF PROCEDURE:  DATE OF DISCHARGE:                               OPERATIVE REPORT   PROCEDURE PERFORMED:  Ileocolonoscopy with snare polypectomy.  Hemostasis therapy.   INDICATIONS FOR PROCEDURE:  This is a 40 year old lady admitted to the  hospital  November 30, 2008 with gross painless hematochezia.  She has  remained hemodynamically stable.  Her hemoglobin on admission was 14.5;  yesterday it was 12.3;  this afternoon it is 13.9 when I see it.  She  took her prep last evening and did not have any further rectal bleeding.  She has been taking relatively large doses of ibuprofen  recently.  Colonoscopy is now being done.  Risks, benefits, alternatives and  limitations have been reviewed.  The potential for looking at her upper  GI tract after colonoscopy reviewed with Rhonda Porter if nothing is found  to explain bleeding, questions answered.  She is agreeable.  Please see  the documentation in the medical record.   PROCEDURE NOTE:  O2 saturation, blood pressure, pulse, respirations were  monitored during the entire procedure.  Conscious sedation Versed 7 mg  IV and Demerol 150 mg IV in divided doses.   INSTRUMENT:  Pentax video chip system.   FINDINGS:  Digital rectal exam revealed no abnormalities on scout  findings, the prep was good.  Colon:  Colonic mucosa was surveyed from  the rectosigmoid junction through the left transverse and right colon to  appendiceal orifice, ileocecal valve and cecum.  These structures well  seen and photographed for the record.  The terminal ileum was intubated  a good 20 cm.  From this level scope was slowly and cautiously  withdrawn.  All previous mentioned mucosal surfaces were again seen.  The  patient had numerous left-sided diverticula and a single tic at the  splenic flexure; there was adherent fresh clot and some trickle of fresh  blood coming from the base of this tic. Please see photos.  The  remainder of the colonic mucosa side from again left-sided diverticula  appeared entirely normal as did the terminal ileum mucosa.  There was no  blood anywhere else in the lower GI tract or in the distal small bowel  that was surveyed.   The tip with adherent clot was clipped with a single Arboriculturist with good hemostasis achieved.  The scope was pulled  down into the rectum where a thorough examination of rectal mucosa  including retroflexed view of the anal verge demonstrated a 5 mm polyp  and at 15 cm from the anal verge.  I did not see any significant  hemorrhoidal disease or other abnormality.  This polyp was first removed  with hot snare cautery and recovered through the scope.  The patient  tolerated the procedure very well in reacting to endoscopy.  EGD was  not  done.   IMPRESSION:  1. 5 mm rectal polyp as described above, removed as described above.      Otherwise unremarkable rectum.  2. Left-sided diverticula.  3. Solitary diverticulum at the splenic flexure with adherent clot and      oozing status post clipping with good hemostasis.  The remainder of      colonic mucosa and terminal ileum mucosa appeared normal.   RECOMMENDATIONS:  Begin low residue diet.  No NSAIDs or aspirin  absolutely times 7 days and then would resume ibuprofen only judiciously  as absolutely needed.  1. Would begin a high-fiber diet in 1 week.  2. Check CBC tomorrow morning.  3. Anticipate discharge December 03, 2008.  4. Followup on pathology.  5. No MRI until clip has passed.      Jonathon Bellows, M.D.  Electronically Signed     RMR/MEDQ  D:  12/02/2008  T:  12/02/2008  Job:  [id number]   cc:   Dr. __________   Ree Kida Dr. Margo Aye

## 2011-07-30 LAB — CBC
Hemoglobin: 14.5
MCHC: 34.3
MCV: 92.5
RDW: 13.2

## 2011-07-30 LAB — DIFFERENTIAL
Basophils Absolute: 0.1
Basophils Relative: 0
Eosinophils Absolute: 0.2
Eosinophils Relative: 2
Monocytes Absolute: 1
Neutro Abs: 9.8 — ABNORMAL HIGH

## 2011-07-31 LAB — BASIC METABOLIC PANEL
BUN: 7
CO2: 25
Calcium: 9.9
GFR calc non Af Amer: >60
Glucose, Bld: 113 — ABNORMAL HIGH
Potassium: 4.1
Sodium: 139

## 2011-07-31 LAB — URINALYSIS, ROUTINE W REFLEX MICROSCOPIC
Glucose, UA: NEGATIVE
Hgb urine dipstick: NEGATIVE
Protein, ur: NEGATIVE
Specific Gravity, Urine: 1.01
pH: 7.5

## 2011-07-31 LAB — COMPREHENSIVE METABOLIC PANEL
ALT: 14
Albumin: 3.9
Alkaline Phosphatase: 88
Calcium: 9.4
GFR calc Af Amer: >60
Potassium: 3.8
Sodium: 136
Total Protein: 7

## 2011-07-31 LAB — CBC
Hemoglobin: 15.6 — ABNORMAL HIGH
MCHC: 33.2
MCHC: 33.6
Platelets: 413 — ABNORMAL HIGH
RBC: 5.09
RDW: 13.1
WBC: 13.5 — ABNORMAL HIGH

## 2011-07-31 LAB — POCT CARDIAC MARKERS
CKMB, poc: 1.3
Troponin i, poc: <0.05

## 2011-07-31 LAB — DIFFERENTIAL
Basophils Relative: 1
Basophils Relative: 2 — ABNORMAL HIGH
Eosinophils Absolute: 0.2
Lymphocytes Relative: 22
Lymphs Abs: 2.8
Monocytes Absolute: 0.8
Monocytes Absolute: 0.9
Monocytes Relative: 6
Monocytes Relative: 8
Neutro Abs: 9.5 — ABNORMAL HIGH
Neutrophils Relative %: 70

## 2011-10-30 HISTORY — PX: KNEE ARTHROSCOPY: SUR90

## 2014-05-11 ENCOUNTER — Encounter: Payer: Self-pay | Admitting: Internal Medicine

## 2014-05-19 ENCOUNTER — Other Ambulatory Visit (INDEPENDENT_AMBULATORY_CARE_PROVIDER_SITE_OTHER): Payer: BC Managed Care – PPO

## 2014-05-19 ENCOUNTER — Encounter: Payer: Self-pay | Admitting: Internal Medicine

## 2014-05-19 ENCOUNTER — Ambulatory Visit (INDEPENDENT_AMBULATORY_CARE_PROVIDER_SITE_OTHER): Payer: BC Managed Care – PPO | Admitting: Internal Medicine

## 2014-05-19 VITALS — BP 124/86 | HR 80 | Ht 60.0 in | Wt 186.8 lb

## 2014-05-19 DIAGNOSIS — R198 Other specified symptoms and signs involving the digestive system and abdomen: Secondary | ICD-10-CM

## 2014-05-19 DIAGNOSIS — R141 Gas pain: Secondary | ICD-10-CM

## 2014-05-19 DIAGNOSIS — K589 Irritable bowel syndrome without diarrhea: Secondary | ICD-10-CM

## 2014-05-19 DIAGNOSIS — R1030 Lower abdominal pain, unspecified: Secondary | ICD-10-CM

## 2014-05-19 DIAGNOSIS — R142 Eructation: Secondary | ICD-10-CM

## 2014-05-19 DIAGNOSIS — R143 Flatulence: Secondary | ICD-10-CM

## 2014-05-19 DIAGNOSIS — R109 Unspecified abdominal pain: Secondary | ICD-10-CM

## 2014-05-19 DIAGNOSIS — R140 Abdominal distension (gaseous): Secondary | ICD-10-CM

## 2014-05-19 LAB — COMPREHENSIVE METABOLIC PANEL
ALBUMIN: 4.1 g/dL (ref 3.5–5.2)
ALT: 17 U/L (ref 0–35)
AST: 17 U/L (ref 0–37)
Alkaline Phosphatase: 78 U/L (ref 39–117)
BUN: 8 mg/dL (ref 6–23)
CALCIUM: 9.6 mg/dL (ref 8.4–10.5)
CHLORIDE: 104 meq/L (ref 96–112)
CO2: 32 mEq/L (ref 19–32)
Creatinine, Ser: 0.7 mg/dL (ref 0.4–1.2)
GFR: 105.72 mL/min (ref >60.00)
Glucose, Bld: 100 mg/dL — ABNORMAL HIGH (ref 70–99)
POTASSIUM: 4.3 meq/L (ref 3.5–5.1)
Sodium: 139 mEq/L (ref 135–145)
TOTAL PROTEIN: 7.3 g/dL (ref 6.0–8.3)
Total Bilirubin: 0.3 mg/dL (ref 0.2–1.2)

## 2014-05-19 LAB — CBC WITH DIFFERENTIAL/PLATELET
BASOS ABS: 0 10*3/uL (ref 0.0–0.1)
Basophils Relative: 0.3 % (ref 0.0–3.0)
EOS ABS: 0.2 10*3/uL (ref 0.0–0.7)
Eosinophils Relative: 1.8 % (ref 0.0–5.0)
HCT: 44.1 % (ref 36.0–46.0)
Hemoglobin: 15 g/dL (ref 12.0–15.0)
LYMPHS PCT: 25.7 % (ref 12.0–46.0)
Lymphs Abs: 3.3 10*3/uL (ref 0.7–4.0)
MCHC: 34 g/dL (ref 30.0–36.0)
MCV: 90.1 fl (ref 78.0–100.0)
MONOS PCT: 7.2 % (ref 3.0–12.0)
Monocytes Absolute: 0.9 10*3/uL (ref 0.1–1.0)
NEUTROS PCT: 65 % (ref 43.0–77.0)
Neutro Abs: 8.4 10*3/uL — ABNORMAL HIGH (ref 1.4–7.7)
Platelets: 436 10*3/uL — ABNORMAL HIGH (ref 150.0–400.0)
RBC: 4.9 Mil/uL (ref 3.87–5.11)
RDW: 13.6 % (ref 11.5–15.5)
WBC: 12.9 10*3/uL — ABNORMAL HIGH (ref 4.0–10.5)

## 2014-05-19 LAB — IGA: IgA: 197 mg/dL (ref 68–378)

## 2014-05-19 LAB — TSH: TSH: 1.15 u[IU]/mL (ref 0.35–4.50)

## 2014-05-19 MED ORDER — DICYCLOMINE HCL 20 MG PO TABS: ORAL_TABLET | ORAL | Status: DC

## 2014-05-19 MED ORDER — SACCHAROMYCES BOULARDII 250 MG PO CAPS: 250.0000 mg | ORAL_CAPSULE | Freq: Two times a day (BID) | ORAL | Status: DC

## 2014-05-19 NOTE — Progress Notes (Signed)
Patient ID: Rhonda Porter, female   DOB: Mar 05, 1971, 43 y.o.   MRN: [id number] HPI: Rhonda Porter is a 43 year old female with a past medical history of diverticulosis with diverticular hemorrhage in February 2010 treated with endoscopic clipping, history of hyperplastic rectal polyp, hypertension, and remote melanoma who is seen to evaluate alternating bowel habits and lower abdominal pain. She is here alone today. She reports over the last 2-3 weeks having developed alternating diarrhea with formed stools and at times constipation. This has been associated with lower abdominal cramping pain which is relieved with defecation. It is also been associated with abdominal bloating. No known sick contacts. She denies upper GI symptoms including no nausea or vomiting. No heartburn, dysphagia or odynophagia. She reports the lower abdominal cramping pain and diarrhea have been sporadic over the last 2-3 weeks. She seen no blood in her stool or melena. Foods such as fruits, salads and lactose have made the diarrhea worse. No fevers or chills. She does report a history of mild leukocytosis and was seen by hematology monitored and reports released. She recalls being told this was a benign process. She was seen by her GYN provider, Dr. Marvel Plan who performed a pelvic ultrasound which was unremarkable. The patient was told to her lower abdominal cramping pain does not GYN related. The ultrasound showed normal vaginal cuff with normal appearing bilateral ovaries. No free fluid present. Kidneys appeared normal. This examination was performed on 05/07/2014  Past Medical History  Diagnosis Date  . Hypertension   . History of colonic polyps   . Diverticula of colon   . Melanoma 2006  . Ganglion cyst     Past Surgical History  Procedure Laterality Date  . Abdominal hysterectomy  1999  . Spinal fusion    . Carpal tunnel release    . Breast biopsy    . Knee arthroscopy  2013  . Melanoma excision  2006    No  outpatient prescriptions prior to visit.   No facility-administered medications prior to visit.    Allergies  Allergen Reactions  . Penicillins   . Sulfa Antibiotics     Family History  Problem Relation Age of Onset  . Hypertension Mother   . Leukemia Maternal Grandmother   . Lymphoma Maternal Grandmother     History  Substance Use Topics  . Smoking status: Current Every Day Smoker  . Smokeless tobacco: Never Used  . Alcohol Use: No    ROS: As per history of present illness, otherwise negative  BP 124/86  Pulse 80  Ht 5' (1.524 m)  Wt 186 lb 12.8 oz (84.732 kg)  BMI 36.48 kg/m2 Constitutional: Well-developed and well-nourished. No distress. HEENT: Normocephalic and atraumatic. Oropharynx is clear and moist. No oropharyngeal exudate. Conjunctivae are normal.  No scleral icterus. Neck: Neck supple. Trachea midline. Cardiovascular: Normal rate, regular rhythm and intact distal pulses. No M/R/G Pulmonary/chest: Effort normal and breath sounds normal. No wheezing, rales or rhonchi. Abdominal: Soft, nontender, nondistended. Bowel sounds active throughout. Marland Kitchen Extremities: no clubbing, cyanosis, or edema Lymphadenopathy: No cervical adenopathy noted. Neurological: Alert and oriented to person place and time. Skin: Skin is warm and dry. No rashes noted. Psychiatric: Normal mood and affect. Behavior is normal.  RELEVANT LABS AND IMAGING: CBC    Component Value Date/Time   WBC 12.9* 05/19/2014 1553   RBC 4.90 05/19/2014 1553   HGB 15.0 05/19/2014 1553   HCT 44.1 05/19/2014 1553   PLT 436.0* 05/19/2014 1553   MCV 90.1 05/19/2014 1553  MCHC 34.0 05/19/2014 1553   RDW 13.6 05/19/2014 1553   LYMPHSABS 3.3 05/19/2014 1553   MONOABS 0.9 05/19/2014 1553   EOSABS 0.2 05/19/2014 1553   BASOSABS 0.0 05/19/2014 1553    CMP     Component Value Date/Time   NA 139 05/19/2014 1553   K 4.3 05/19/2014 1553   CL 104 05/19/2014 1553   CO2 32 05/19/2014 1553   GLUCOSE 100* 05/19/2014 1553    BUN 8 05/19/2014 1553   CREATININE 0.7 05/19/2014 1553   CALCIUM 9.6 05/19/2014 1553   PROT 7.3 05/19/2014 1553   ALBUMIN 4.1 05/19/2014 1553   AST 17 05/19/2014 1553   ALT 17 05/19/2014 1553   ALKPHOS 78 05/19/2014 1553   BILITOT 0.3 05/19/2014 1553   GFRNONAA >60 12/01/2008 0530   GFRAA  Value: >60        The eGFR has been calculated using the MDRD equation. This calculation has not been validated in all clinical situations. eGFR's persistently <60 mL/min signify possible Chronic Kidney Disease. 12/01/2008 0530   Colonoscopy - 12/02/2008 -- Dr. Buford Dresser -- 5 mm rectal polyp removed, left-sided diverticulosis, solitary diverticulum bleeding at the splenic flexure treated with clipping. Terminal ileum normal. Prep good.  ASSESSMENT/PLAN: 43 year old female with a past medical history of diverticulosis with diverticular hemorrhage in February 2010 treated with endoscopic clipping, history of hyperplastic rectal polyp, hypertension, and remote melanoma who is seen to evaluate alternating bowel habits and lower abdominal pain.  1. Lower abd cramping pain/alternating bowel habits/bloating -- her symptoms are most consistent with a flare of irritable bowel disease. There is no alarm symptoms such as bleeding, fever.  She is previously had a colonoscopy performed 5 years ago which revealed diverticulosis and a non-adenomatous polyp.  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 I recommended Benefiber 1 tablespoon daily, Florastor 250 mg twice daily. CBC, CMP today unremarkable. TSH and celiac pending. I have given her a prescription for Bentyl 20 mg 3 times daily as needed for lower abdominal cramping pain. Return to the office in 4-6 weeks for followup, if symptoms fail to improve then consider colonoscopy.

## 2014-05-19 NOTE — Patient Instructions (Signed)
Your physician has requested that you go to the basement for the following lab work before leaving today:CBC, CMP, TSH, IGA, TTG.  We have sent the following medications to your pharmacy for you to pick up at your convenience: Bentyl.   Start over the counter Benefiber one tablespoon daily and Florastor once daily x 1 month.  Please follow up with Nicoletta Ba, PA on 06/21/14 at 8:30am. If you need to reschedule or cancel, please call [phone].  cc: Judd Lien, MD

## 2014-05-20 LAB — TISSUE TRANSGLUTAMINASE, IGA: Tissue Transglutaminase Ab, IgA: 7.4 U/mL (ref <20)

## 2014-06-21 ENCOUNTER — Ambulatory Visit (INDEPENDENT_AMBULATORY_CARE_PROVIDER_SITE_OTHER): Payer: BC Managed Care – PPO | Admitting: Physician Assistant

## 2014-06-21 ENCOUNTER — Encounter: Payer: Self-pay | Admitting: Physician Assistant

## 2014-06-21 VITALS — BP 122/72 | HR 84 | Ht 60.0 in | Wt 184.1 lb

## 2014-06-21 DIAGNOSIS — R197 Diarrhea, unspecified: Secondary | ICD-10-CM

## 2014-06-21 DIAGNOSIS — K589 Irritable bowel syndrome without diarrhea: Secondary | ICD-10-CM

## 2014-06-21 MED ORDER — SACCHAROMYCES BOULARDII 250 MG PO CAPS: 250.0000 mg | ORAL_CAPSULE | Freq: Two times a day (BID) | ORAL | Status: DC

## 2014-06-21 NOTE — Patient Instructions (Signed)
We sent a prescription for Florastor to [facility].   Continue the Benefiber daily.  Continue the Bentyl as needed for cramps and spasms, diarrhea.

## 2014-06-22 ENCOUNTER — Encounter: Payer: Self-pay | Admitting: Physician Assistant

## 2014-06-22 NOTE — Progress Notes (Addendum)
Subjective:    Patient ID: Rhonda Porter, female    DOB: [birth year], 44 y.o.   MRN: [id number]  HPI Rhonda Porter Is a pleasant 43 year old white female known to Dr. Hilarie Porter .She  has history of diverticulosis and a prior diverticular bleed in 2010. She also has history of a hyperplastic rectal polyp. She has been treated for IBS with sporadic episodes of diarrhea and abdominal cramping. She says she had been doing very well on a regimen of Florastor Benefiber and periodic Bentyl. Prior testing for celiac disease was negative. Pt comes in today after an episode last week with a 3-4 day history of diarrhea and abdominal cramping across her lower abdomen. She says she did take Bentyl and found that helpful. At this time the diarrhea has resolved and she is feeling better. She says she had gotten off of the [facility] and has now restarted it and says she found it very helpful in the past but was expensive. She is concerned because she has missed work a few times this summer because of episodes of diarrhea which she says are very unpredictable. She says her daughter has very strict policy and she would like a statement from os that she may with Ms. work occasionally do to diarrhea episodes.     Review of Systems  Constitutional: Negative.   HENT: Negative.   Eyes: Negative.   Respiratory: Negative.   Cardiovascular: Negative.   Gastrointestinal: Positive for abdominal pain and diarrhea. Negative for abdominal distention.  Endocrine: Negative.   Genitourinary: Negative.   Musculoskeletal: Negative.   Allergic/Immunologic: Negative.   Neurological: Negative.   Hematological: Negative.   Psychiatric/Behavioral: Negative.    Outpatient Prescriptions Prior to Visit  Medication Sig Dispense Refill  . aspirin-acetaminophen-caffeine (EXCEDRIN EXTRA STRENGTH) 250-250-65 MG per tablet Take 1 tablet by mouth every 6 (six) hours as needed for headache.      . cyclobenzaprine (FLEXERIL) 10 MG tablet       .  dicyclomine (BENTYL) 20 MG tablet One tablet by mouth three times a day as needed for lower abdominal cramping  90 tablet  5  . ibuprofen (ADVIL,MOTRIN) 200 MG tablet Take 200 mg by mouth every 6 (six) hours as needed.      . saccharomyces boulardii (FLORASTOR) 250 MG capsule Take 1 capsule (250 mg total) by mouth 2 (two) times daily.  60 capsule     No facility-administered medications prior to visit.   Allergies  Allergen Reactions  . Hydrocodone     Have hypersensitivity to most pain meds  . Penicillins   . Sulfa Antibiotics    Patient Active Problem List   Diagnosis Date Noted  . DIVERTICULAR BLEEDING, HX OF 01/18/2009  . HYPERTENSION 01/17/2009  . COLONIC POLYPS, HYPERPLASTIC, HX OF 01/17/2009   History  Substance Use Topics  . Smoking status: Current Every Day Smoker  . Smokeless tobacco: Never Used  . Alcohol Use: No     family history includes Hypertension in her mother; Leukemia in her maternal grandmother; Lymphoma in her maternal grandmother.  Objective:   Physical Exam    well-developed white female in no acute distress, blood pressure 122/72 pulse 84 height 5 foot weight 184. HEENT ;nontraumatic normocephalic EOMI PERRLA sclera anicteric, Supple; no JVD, Cardiovascular; regular rate and rhythm with S1-S2 no murmur or gallop, Pulmonary; clear bilaterally, Abdomen; soft nontender nondistended bowel sounds are active there is no palpable mass or hepatosplenomegaly, Rectal; exam not done, Shoney's no clubbing cyanosis or edema skin warm  and dry, Psych; mood and affect appropriate        Assessment & Plan:  # 36  43 year old female with history of IBS and episodic diarrhea with recent exacerbation- now improved #2 diverticulosis and previous history of diverticular bleed #3 history of hyperplastic colon polyp last colonoscopy 2010  Plan; as patient is better at this time do not feel that she needs any further workup Continue Florastor on a daily basis or suggested  Restora as an alternative if this is less expensive Bentyl 10 mg once or twice daily as needed for abdominal cramping Benefiber one dose daily Patient brought papers with her from her employer ,for days missed over the past few months. We will give her a statement that shows she has IBS- diarrhea predominant and may periodically require a work absence with exacerbations. She is also advised that she may use over-the-counter Imodium on a when necessary basis.  Addendum: Reviewed and agree with initial management. Jerene Bears, MD

## 2014-07-14 ENCOUNTER — Telehealth: Payer: Self-pay | Admitting: *Deleted

## 2014-07-20 NOTE — Telephone Encounter (Signed)
Opened in error

## 2014-08-10 ENCOUNTER — Ambulatory Visit (INDEPENDENT_AMBULATORY_CARE_PROVIDER_SITE_OTHER): Payer: BC Managed Care – PPO | Admitting: Internal Medicine

## 2014-08-10 ENCOUNTER — Encounter: Payer: Self-pay | Admitting: Internal Medicine

## 2014-08-10 VITALS — BP 124/80 | HR 72 | Ht 60.0 in | Wt 186.0 lb

## 2014-08-10 DIAGNOSIS — K589 Irritable bowel syndrome without diarrhea: Secondary | ICD-10-CM

## 2014-08-10 DIAGNOSIS — R195 Other fecal abnormalities: Secondary | ICD-10-CM

## 2014-08-10 DIAGNOSIS — D72829 Elevated white blood cell count, unspecified: Secondary | ICD-10-CM

## 2014-08-10 MED ORDER — RIFAXIMIN 550 MG PO TABS: 550.0000 mg | ORAL_TABLET | Freq: Three times a day (TID) | ORAL | Status: DC

## 2014-08-10 NOTE — Patient Instructions (Signed)
We have sent your prescription to your pharmacy Serv[facility]) Use Bentyl as needed Resume Florastor Follow up in 8 weeks (10/12/2014 at 9:30am)

## 2014-08-10 NOTE — Progress Notes (Signed)
Subjective:    Patient ID: Rhonda Porter, female    DOB: [birth year], 43 y.o.   MRN: [id number]  HPI Rhonda Porter is a 43 year old female with a past medical history of diverticulosis with diverticular bleeding in February 2010 treated with endoscopic clipping, history of hyperplastic rectal polyp, hypertension and remote melanoma is seen for followup to evaluate lower abdominal cramping pain with occasional loose stool. She reports she is feeling much better than when she first came to see [place]. She is using Florastor 250 mg twice daily and Benefiber once daily which she feels has helped significantly. She is also avoiding lactose. She reports still having periods of "upset stomach" which for her is lower abdominal cramping just above the pubic symphysis along with loose stools. No blood in her stool or melena. Last episode was Sunday and Monday of this week but each day since Sunday has been better. She tried Bentyl over the weekend without much benefit and had only needed it one other time since last being seen here. The first time she tried it helped significantly. No weight loss, nausea or vomiting. Good appetite. No early satiety. No hepatobiliary complaints.  Initially she felt her lower abdominal cramping was related to an ovarian cyst she was seen by GYN and had a normal transvaginal ultrasound. She is status post hysterectomy so she does not have periods  No fevers chills or night sweats.   Review of Systems As per history of present illness, otherwise negative  Current Medications, Allergies, Past Medical History, Past Surgical History, Family History and Social History were reviewed in [employer] record.     Objective:   Physical Exam BP 124/80  Pulse 72  Ht 5' (1.524 m)  Wt 186 lb (84.369 kg)  BMI 36.33 kg/m2 Constitutional: Well-developed and well-nourished. No distress. HEENT: Normocephalic and atraumatic. Oropharynx is clear and moist. No oropharyngeal  exudate. Conjunctivae are normal.  No scleral icterus. Cardiovascular: Normal rate, regular rhythm and intact distal pulses. No M/R/G Pulmonary/chest: Effort normal and breath sounds normal. No wheezing, rales or rhonchi. Abdominal: Soft, nontender, nondistended. Bowel sounds active throughout. Extremities: no clubbing, cyanosis, or edema Neurological: Alert and oriented to person place and time. Skin: Skin is warm and dry. No rashes noted. Psychiatric: Normal mood and affect. Behavior is normal.  CBC    Component Value Date/Time   WBC 12.9* 05/19/2014 1553   RBC 4.90 05/19/2014 1553   HGB 15.0 05/19/2014 1553   HCT 44.1 05/19/2014 1553   PLT 436.0* 05/19/2014 1553   MCV 90.1 05/19/2014 1553   MCHC 34.0 05/19/2014 1553   RDW 13.6 05/19/2014 1553   LYMPHSABS 3.3 05/19/2014 1553   MONOABS 0.9 05/19/2014 1553   EOSABS 0.2 05/19/2014 1553   BASOSABS 0.0 05/19/2014 1553    CMP     Component Value Date/Time   NA 139 05/19/2014 1553   K 4.3 05/19/2014 1553   CL 104 05/19/2014 1553   CO2 32 05/19/2014 1553   GLUCOSE 100* 05/19/2014 1553   BUN 8 05/19/2014 1553   CREATININE 0.7 05/19/2014 1553   CALCIUM 9.6 05/19/2014 1553   PROT 7.3 05/19/2014 1553   ALBUMIN 4.1 05/19/2014 1553   AST 17 05/19/2014 1553   ALT 17 05/19/2014 1553   ALKPHOS 78 05/19/2014 1553   BILITOT 0.3 05/19/2014 1553   GFRNONAA >60 12/01/2008 0530   GFRAA  Value: >60        The eGFR has been calculated using the MDRD equation.  This calculation has not been validated in all clinical situations. eGFR's persistently <60 mL/min signify possible Chronic Kidney Disease. 12/01/2008 0530   Celiac panel neg  TSH normal  Colonoscopy - 12/02/2008 -- Dr. Buford Dresser -- 5 mm rectal polyp removed, left-sided diverticulosis, solitary diverticulum bleeding at the splenic flexure treated with clipping. Terminal ileum normal. Prep good.     Assessment & Plan:  43 year old female with a past medical history of diverticulosis with diverticular bleeding in  February 2010 treated with endoscopic clipping, history of hyperplastic rectal polyp, hypertension and remote melanoma is seen for followup to evaluate lower abdominal cramping pain with occasional loose stool.   1. Lower abd cramping/loose stools/bloating -- she improved with Florastor and Benefiber though not entirely. Her previous workup has been unremarkable and her celiac test was negative. Symptoms are most consistent with irritable bowel, diarrhea predominant. For this reason I'm going to prescribe rifaximin 550 mg 3 times a day. She will hold Florastor during this therapy and then resume once she completes therapy. She can use Bentyl on an as-needed basis as previously prescribed. She will also continue Benefiber. If symptoms fail to improve, further consideration for cross-sectional imaging or colonoscopy  2. Elevated WBC -- chronic previously evaluated by hematology. She again inquires about persistently elevated white count. She has labs coming up through her employer in December. CBC to be checked again and if elevation persists she would like to see hematology again. This is reasonable and I can place this referral for her pending labs from December.  Return in 8-12 weeks

## 2014-08-11 ENCOUNTER — Telehealth: Payer: Self-pay | Admitting: Internal Medicine

## 2014-08-11 NOTE — Telephone Encounter (Signed)
Discussed with pt that it should be fine for her to get her flu shot on Friday. Pt verbalized understanding.

## 2014-08-27 ENCOUNTER — Telehealth: Payer: Self-pay | Admitting: Internal Medicine

## 2014-08-27 NOTE — Telephone Encounter (Signed)
FYI- doing better. Mcarthur Rossetti is completed. She is restarting her Florastor. No new problems. Never developed any diarrhea.

## 2014-10-12 ENCOUNTER — Encounter: Payer: Self-pay | Admitting: Internal Medicine

## 2014-10-12 ENCOUNTER — Ambulatory Visit (INDEPENDENT_AMBULATORY_CARE_PROVIDER_SITE_OTHER): Payer: BC Managed Care – PPO | Admitting: Internal Medicine

## 2014-10-12 VITALS — BP 120/80 | HR 95 | Ht 60.0 in | Wt 184.6 lb

## 2014-10-12 DIAGNOSIS — D72829 Elevated white blood cell count, unspecified: Secondary | ICD-10-CM

## 2014-10-12 DIAGNOSIS — K589 Irritable bowel syndrome without diarrhea: Secondary | ICD-10-CM

## 2014-10-12 NOTE — Patient Instructions (Signed)
Per Dr Hilarie Fredrickson continue your Benefiber and Florastor.  Follow up with Dr Hilarie Fredrickson in 8 months, we will put a recall in the system to remind you to set up an appointment.  Call us if you feel you need another round of the xifaxan.   We are sending the papers you need down to [facility] to work on.   I appreciate the opportunity to care for you.

## 2014-10-12 NOTE — Progress Notes (Signed)
   Subjective:    Patient ID: Rhonda Porter, female    DOB: Dec 29, 1970, 43 y.o.   MRN: [id number]  HPI Rhonda Porter is a 43 year old female with a past medical history of diverticular hemorrhage in 2010, IBS who is seen in follow-up. She was last seen on 08/10/2014 and treated with rifaximin for 14 days for irritable bowel with diarrhea predominance. He was also having lower abdominal cramping on most days. Today she reports she is doing well though on Sunday she developed recurrent diarrhea after eating. She did complete rifaximin in October and reported this helped tremendously. Her diarrhea and loose stools improved and she has had no further lower abdominal cramping. She continues with Florastor and daily Benefiber. Appetite has been good, without nausea or vomiting. No hepatobiliary complaints. No blood in her stool or melena. No fevers or chills. No night sweats.  Labs checked recently through her employer. She reports her white cell count normalized though her platelets were mildly elevated.   Review of Systems As per history of present illness, otherwise negative  Current Medications, Allergies, Past Medical History, Past Surgical History, Family History and Social History were reviewed in [employer] record.     Objective:   Physical Exam BP 120/80 mmHg  Pulse 95  Ht 5' (1.524 m)  Wt 184 lb 9.6 oz (83.734 kg)  BMI 36.05 kg/m2  SpO2 98% Constitutional: Well-developed and well-nourished. No distress. HEENT: Normocephalic and atraumatic. Oropharynx is clear and moist. No oropharyngeal exudate. Conjunctivae are normal.  No scleral icterus. Neck: Neck supple. Trachea midline. Cardiovascular: Normal rate, regular rhythm and intact distal pulses. No M/R/G Pulmonary/chest: Effort normal and breath sounds normal. No wheezing, rales or rhonchi. Abdominal: Soft, nontender, nondistended. Bowel sounds active throughout.  Extremities: no clubbing, cyanosis, or  edema Neurological: Alert and oriented to person place and time. Skin: Skin is warm and dry. No rashes noted. Psychiatric: Normal mood and affect. Behavior is normal.  Colonoscopy -- 12/02/2008 - Dr. Buford Dresser -- 5 mm rectal hyperplastic polyp, left-sided diverticulosis, solitary bleeding diverticulum at the splenic flexure treated with clipping. Normal TI good prep      Assessment & Plan:  43 year old female with a past medical history of diverticular hemorrhage in 2010, IBS who is seen in follow-up.   1. IBS-D -- symptoms consistent with irritable bowel with diarrhea predominance and positive response to rifaximin for 14 days. Response has lasted though she has had recent return of diarrhea. We will watch this for now not retreat. If loose stools continue or lower abdominal cramping pain returns I have recommended retreatment with rifaximin 550 mg 3 times daily for 14 days. She is aware of this recommendation. She will continue Florastor 250 mg twice daily and Benefiber 1 tablespoon daily. I asked that she call if symptoms deteriorate.  2. Elevate WBC count -- normalized on recent CBC performed through her employer. She will fax or mail [place] a copy of her labs for completeness.  Return in approximately 8 months, sooner if necessary.

## 2014-11-02 ENCOUNTER — Telehealth: Payer: Self-pay | Admitting: Internal Medicine

## 2014-11-02 NOTE — Telephone Encounter (Signed)
FMLA forms were unable to be located from Dr Hilarie Fredrickson, medical records or [employer]. Dr Hilarie Fredrickson does now recall patient bringing forms to office visit. I instructed patient to fax blank forms to my fax number and Dr Hilarie Fredrickson would fill out. Dr Hilarie Fredrickson has now filled out the forms and forms have been faxed back to the patient to return to her employer.

## 2014-11-02 NOTE — Telephone Encounter (Signed)
Left message for patient to call back. Dr Hilarie Fredrickson nor I have seen any FMLA forms for this patient. Where did she drop them off?

## 2014-12-23 ENCOUNTER — Telehealth: Payer: Self-pay | Admitting: Internal Medicine

## 2014-12-23 NOTE — Telephone Encounter (Signed)
Pt states she is having problems with abdominal cramping and bloating. States she goes to the bathroom a lot and does not feel like she should be this bloated. States she has some medication for the cramping but it does not seem to be working. Pt scheduled to see Clairissa Hvozdovic, PA-C 12/29/14@2 :45pm. Pt aware of appt.

## 2014-12-29 ENCOUNTER — Encounter: Payer: Self-pay | Admitting: Physician Assistant

## 2014-12-29 ENCOUNTER — Ambulatory Visit (INDEPENDENT_AMBULATORY_CARE_PROVIDER_SITE_OTHER): Payer: BLUE CROSS/BLUE SHIELD | Admitting: Physician Assistant

## 2014-12-29 VITALS — BP 124/80 | HR 76 | Ht 60.5 in | Wt 186.4 lb

## 2014-12-29 DIAGNOSIS — K589 Irritable bowel syndrome without diarrhea: Secondary | ICD-10-CM

## 2014-12-29 MED ORDER — RIFAXIMIN 550 MG PO TABS: 550.0000 mg | ORAL_TABLET | Freq: Three times a day (TID) | ORAL | Status: DC

## 2014-12-29 NOTE — Progress Notes (Addendum)
Patient ID: Rhonda Porter, female   DOB: [birth year], 44 y.o.   MRN: [id number]     History of Present Illness: Rhonda Porter  is a 44 year old female known to Dr. Hilarie Fredrickson with a past medical history of diverticular hemorrhage in 2010, and IBS who is here for follow-up. She was last seen in December 2015 at which time she was doing fairly well she has been continuing Stage [employer] and [employer]. She is here today because she has having increased incidence of lower abdominal cramping. Over the past 2 weeks she has had 2 episodes of explosive diarrhea and gas. Otherwise she is having formed bowel movements. She reports that 2 weeks ago she started oh diet and has been eating a lot of salads and vegetables and feels gassier than normal. She also has cramping after her meals but says she is only using her dicyclomine at bedtime because she is afraid it will make her sleepy at work. She has had no blood in her stools or melena. She has no fever or chills. She has no night sweats. She has no nausea or vomiting. Her appetite has been good.   Past Medical History  Diagnosis Date  . Hypertension   . History of colonic polyps   . Diverticula of colon   . Melanoma 2006  . Ganglion cyst     Past Surgical History  Procedure Laterality Date  . Abdominal hysterectomy  1999  . Spinal fusion    . Carpal tunnel release    . Breast biopsy    . Knee arthroscopy  2013  . Melanoma excision  2006   Family History  Problem Relation Age of Onset  . Hypertension Mother   . Leukemia Maternal Grandmother   . Lymphoma Maternal Grandmother    History  Substance Use Topics  . Smoking status: Current Every Day Smoker  . Smokeless tobacco: Never Used  . Alcohol Use: No   Current Outpatient Prescriptions  Medication Sig Dispense Refill  . aspirin-acetaminophen-caffeine (EXCEDRIN EXTRA STRENGTH) 250-250-65 MG per tablet Take 1 tablet by mouth every 6 (six) hours as needed for headache.    . cyclobenzaprine (FLEXERIL)  10 MG tablet Take 10 mg by mouth as needed.     . dicyclomine (BENTYL) 20 MG tablet One tablet by mouth three times a day as needed for lower abdominal cramping 90 tablet 5  . ibuprofen (ADVIL,MOTRIN) 200 MG tablet Take 200 mg by mouth every 6 (six) hours as needed.    . montelukast (SINGULAIR) 10 MG tablet Take 10 mg by mouth as needed.    . rifaximin (XIFAXAN) 550 MG TABS tablet Take 1 tablet (550 mg total) by mouth 3 (three) times daily. X 14 days 42 tablet 0  . saccharomyces boulardii (FLORASTOR) 250 MG capsule Take 1 capsule (250 mg total) by mouth 2 (two) times daily. 60 capsule 11  . Wheat Dextrin (BENEFIBER) POWD Take by mouth. Take 1 tablespoon daily     No current facility-administered medications for this visit.   Allergies  Allergen Reactions  . Hydrocodone     Have hypersensitivity to most pain meds  . Penicillins   . Sulfa Antibiotics       Review of Systems: Gen: Denies any fever, chills, sweats, anorexia, fatigue, weakness, malaise, weight loss, and sleep disorder CV: Denies chest pain, angina, palpitations, syncope, orthopnea, PND, peripheral edema, and claudication. Resp: Denies dyspnea at rest, dyspnea with exercise, cough, sputum, wheezing, coughing up blood, and pleurisy. GI: Denies vomiting  blood, jaundice, and fecal incontinence.   Denies dysphagia or odynophagia. GU : Denies urinary burning, blood in urine, urinary frequency, urinary hesitancy, nocturnal urination, and urinary incontinence. MS: Denies joint pain, limitation of movement, and swelling, stiffness, low back pain, extremity pain. Denies muscle weakness, cramps, atrophy.  Derm: Denies rash, itching, dry skin, hives, moles, warts, or unhealing ulcers.  Psych: Denies depression, anxiety, memory loss, suicidal ideation, hallucinations, paranoia, and confusion. Heme: Denies bruising, bleeding, and enlarged lymph nodes. Neuro:  Denies any headaches, dizziness, paresthesia Endo:  Denies any problems with  DM, thyroid, adrenal   Physical Exam: General: Pleasant, well developed  female in no acute distress Head: Normocephalic and atraumatic Eyes:  sclerae anicteric, conjunctiva pink  Ears: Normal auditory acuity Lungs: Clear throughout to auscultation Heart: Regular rate and rhythm Abdomen: Soft, non distended, non-tender. No masses, no hepatomegaly. Normal bowel sounds Musculoskeletal: Symmetrical with no gross deformities  Extremities: No edema  Neurological: Alert oriented x 4, grossly nonfocal Psychological:  Alert and cooperative. Normal mood and affect  Assessment and Recommendations: 44 year old female with a known history of IBS-D now with recurrent gas and bloating and diarrhea. In the past she has responded to Xifaxan and thus will be given a trial of Xifaxan 550 mg 3 times daily for 14 days. She will continue her Florastor and Benefiber. She is also been instructed that she may break her dicyclomine and half to use a dose of 10 mg 3 times a day as needed. She will follow up in 6 weeks, sooner if needed.   Nickalous Stingley, Mirza Kidney PA-C 12/29/2014,  Addendum: Reviewed and agree with initial management. Jerene Bears, MD

## 2014-12-29 NOTE — Patient Instructions (Addendum)
We have sent the following medications to your pharmacy for you to pick up at your convenience: Xifaxan  Please use Beano as needed May break Bentyl in half as needed  Please follow up with Cecille Rubin in 6-8 weeks

## 2015-01-17 ENCOUNTER — Telehealth: Payer: Self-pay | Admitting: Physician Assistant

## 2015-01-17 DIAGNOSIS — R197 Diarrhea, unspecified: Secondary | ICD-10-CM

## 2015-01-17 NOTE — Telephone Encounter (Signed)
Has she increased her dicyclomine as directed? She should try to finish xifaxan. Check stool path panel and fecal elastase. Can try phazyme.

## 2015-01-17 NOTE — Telephone Encounter (Signed)
Patient calling to report she is taking Xifaxan for IBS. States she is still having diarrhea every other day and terrible bloating. States she tried the Beano before eating x 2 and it did not help so she stopped taking it. Please, advise.

## 2015-01-17 NOTE — Telephone Encounter (Signed)
Spoke with patient and she had not been taking Dicyclomine. She stated Dr. Hilarie Fredrickson told her to take that only for abdominal cramping which she is not having. She has completed her Xifaxan. She will pick up stool kits.

## 2015-01-17 NOTE — Telephone Encounter (Signed)
Explained this to patient in previous call.

## 2015-01-17 NOTE — Telephone Encounter (Signed)
When she was here she c/o cramping. The dicyclomine may help with her other sx.

## 2015-02-01 ENCOUNTER — Telehealth: Payer: Self-pay | Admitting: Internal Medicine

## 2015-02-01 NOTE — Telephone Encounter (Signed)
Pt states she is still having problems with abdominal pain and would like to be seen sooner than 1st available. Pt scheduled to see Tye Savoy NP 02/08/15@3pm . Pt aware of appt.

## 2015-02-08 ENCOUNTER — Ambulatory Visit (INDEPENDENT_AMBULATORY_CARE_PROVIDER_SITE_OTHER): Payer: BLUE CROSS/BLUE SHIELD | Admitting: Nurse Practitioner

## 2015-02-08 ENCOUNTER — Encounter: Payer: Self-pay | Admitting: Nurse Practitioner

## 2015-02-08 VITALS — BP 120/74 | HR 76 | Ht 60.5 in | Wt 188.0 lb

## 2015-02-08 DIAGNOSIS — R140 Abdominal distension (gaseous): Secondary | ICD-10-CM

## 2015-02-08 DIAGNOSIS — R109 Unspecified abdominal pain: Secondary | ICD-10-CM | POA: Diagnosis not present

## 2015-02-08 DIAGNOSIS — K589 Irritable bowel syndrome without diarrhea: Secondary | ICD-10-CM | POA: Diagnosis not present

## 2015-02-08 DIAGNOSIS — R1012 Left upper quadrant pain: Secondary | ICD-10-CM | POA: Diagnosis not present

## 2015-02-08 MED ORDER — GLYCOPYRROLATE 2 MG PO TABS: 2.0000 mg | ORAL_TABLET | Freq: Three times a day (TID) | ORAL | Status: DC

## 2015-02-08 NOTE — Patient Instructions (Signed)
Rhonda Porter will call you when she has the free prep for the colonsocopy. Our number is [phone]. Please call if you havn't heard from [place] the week of your appointment. Stop the Bentyl. I sent a prescription for the Robinul [employer]) to [facility], [place].  You have been scheduled for an endoscopy and colonoscopy. Please follow the written instructions given to you at your visit today. If you use inhalers (even only as needed), please bring them with you on the day of your procedure. Your physician has requested that you go to www.startemmi.com and enter the access code given to you at your visit today. This web site gives a general overview about your procedure. However, you should still follow specific instructions given to you by our office regarding your preparation for the procedure.

## 2015-02-09 ENCOUNTER — Telehealth: Payer: Self-pay | Admitting: *Deleted

## 2015-02-09 ENCOUNTER — Encounter: Payer: Self-pay | Admitting: Internal Medicine

## 2015-02-09 NOTE — Telephone Encounter (Signed)
Lm for the patient to advise I have a free Suprep at our front counter for her. I asked if she could please come to our office within the next few days and pick it up.

## 2015-02-10 ENCOUNTER — Encounter: Payer: Self-pay | Admitting: Nurse Practitioner

## 2015-02-10 DIAGNOSIS — R1012 Left upper quadrant pain: Secondary | ICD-10-CM | POA: Insufficient documentation

## 2015-02-10 NOTE — Progress Notes (Addendum)
     History of Present Illness:  Patient is a 44 year old female known to Dr. Hilarie Fredrickson. She has a history of IBS and has been seen here several times for bloating and diarrhea. Patient was seen last by [facility] Who adjusted patient's Dicyclomine and repeated a course of Xifaxan which worked well the first time.   Patient is back with continuous symptoms. She is having bloating, LUQ discomfort and lower abdominal cramping AFTER diarrheal stools. She is having some normal stools as well as those typically aren't associated with the cramping. Xifaxan worked great the first time but repeat round didn't help at all. Bentyl no longer helping.   Current Medications, Allergies, Past Medical History, Past Surgical History, Family History and Social History were reviewed in [employer] record.  Physical Exam: General: Well developed , white female in no acute distress Head: Normocephalic and atraumatic Eyes:  sclerae anicteric, conjunctiva pink  Ears: Normal auditory acuity Lungs: Clear throughout to auscultation Heart: Regular rate and rhythm Abdomen: Soft, obese, non-tender. No masses, no hepatomegaly. Normal bowel sounds Musculoskeletal: Symmetrical with no gross deformities  Extremities: No edema  Neurological: Alert oriented x 4, grossly nonfocal Psychological:  Alert and cooperative. Normal mood and affect  Assessment and Recommendations:   44 year old female with IBS-D. She is having significant bloating,and lower abdominal pain AFTER loose stools. She complains of LUQ pain.  Anti-spasmodics nor Xifaxan helpful Celiac studies negative. Her last colonoscopy was several years ago (another facility) and we don't have results. At this point it isn't unreasonable to repeat colonoscopy (with random biopsies) to make sure this is in fact just severe IBS. In addition, will proceed with EGD for evaluation of LUQ pain. The risks, benefits, and alternatives to EGD and  colonoscopy with possible biopsy and possible polypectomy were discussed with the patient and she consents to proceed.    Addendum: Reviewed and agree with management.  Pt responded well to rifaximin initially but not the 2nd time. Further eval as above Jerene Bears, MD

## 2015-03-08 ENCOUNTER — Other Ambulatory Visit: Payer: Self-pay | Admitting: Internal Medicine

## 2015-03-08 ENCOUNTER — Ambulatory Visit (AMBULATORY_SURGERY_CENTER): Payer: BLUE CROSS/BLUE SHIELD | Admitting: Internal Medicine

## 2015-03-08 ENCOUNTER — Encounter: Payer: Self-pay | Admitting: Internal Medicine

## 2015-03-08 VITALS — BP 141/87 | HR 94 | Temp 99.8°F | Resp 18 | Ht 60.5 in | Wt 188.0 lb

## 2015-03-08 DIAGNOSIS — R195 Other fecal abnormalities: Secondary | ICD-10-CM

## 2015-03-08 DIAGNOSIS — R1030 Lower abdominal pain, unspecified: Secondary | ICD-10-CM | POA: Diagnosis not present

## 2015-03-08 DIAGNOSIS — R140 Abdominal distension (gaseous): Secondary | ICD-10-CM | POA: Diagnosis not present

## 2015-03-08 DIAGNOSIS — R1012 Left upper quadrant pain: Secondary | ICD-10-CM | POA: Diagnosis not present

## 2015-03-08 DIAGNOSIS — R109 Unspecified abdominal pain: Secondary | ICD-10-CM

## 2015-03-08 DIAGNOSIS — K589 Irritable bowel syndrome without diarrhea: Secondary | ICD-10-CM

## 2015-03-08 HISTORY — PX: COLONOSCOPY: SHX174

## 2015-03-08 HISTORY — PX: UPPER GASTROINTESTINAL ENDOSCOPY: SHX188

## 2015-03-08 MED ORDER — SODIUM CHLORIDE 0.9 % IV SOLN: 500.0000 mL | INTRAVENOUS | Status: DC

## 2015-03-08 NOTE — Op Note (Signed)
[place]  [address] [place], [address]   ENDOSCOPY PROCEDURE REPORT  PATIENT: Rhonda Porter, Rhonda Porter  MR#: [id number] BIRTHDATE: 07/22/1971 , 46  yrs. old GENDER: female ENDOSCOPIST: Jerene Bears, MD PROCEDURE DATE:  03/08/2015 PROCEDURE:  EGD w/ biopsy for H.pylori ASA CLASS:     Class II INDICATIONS:  abdominal pain, abdominal bloating. MEDICATIONS: Monitored anesthesia care and Propofol 200 mg IV TOPICAL ANESTHETIC: none  DESCRIPTION OF PROCEDURE: After the risks benefits and alternatives of the procedure were thoroughly explained, informed consent was obtained.  The LB [id number] endoscope was introduced through the mouth and advanced to the second portion of the duodenum , Without limitations.  The instrument was slowly withdrawn as the mucosa was fully examined.   ESOPHAGUS: There was LA Class A reflux esophagitis.  Esophagus otherwise normal, Z-line regular.  STOMACH: The mucosa of the stomach appeared normal.  Cold forcep biopsies were taken at the gastric body, antrum and angularis to evaluate for h.  pylori.  DUODENUM: The duodenal mucosa showed no abnormalities in the bulb and 2nd part of the duodenum.  Retroflexed views revealed no abnormalities.     The scope was then withdrawn from the patient and the procedure completed.  COMPLICATIONS: There were no immediate complications.  ENDOSCOPIC IMPRESSION: 1.   There was mild reflux esophagitis noted 2.   The mucosa of the stomach appeared normal 3.   The duodenal mucosa showed no abnormalities in the bulb and 2nd part of the duodenum  RECOMMENDATIONS: 1.  Await biopsy results 2.  Follow-up of helicobacter pylori status, treat if indicated  eSigned:  Jerene Bears, MD 03/08/2015 4:17 PM    CC: the patient, PCP

## 2015-03-08 NOTE — Op Note (Signed)
[place]  [address] [place], [id number]   COLONOSCOPY PROCEDURE REPORT  PATIENT: Rhonda, Porter  MR#: [id number] BIRTHDATE: [birth year] , 34  yrs. old GENDER: female ENDOSCOPIST: Jerene Bears, MD PROCEDURE DATE:  03/08/2015 PROCEDURE:   Colonoscopy, diagnostic First Screening Colonoscopy - Avg.  risk and is 50 yrs.  old or older - No.  Prior Negative Screening - Now for repeat screening. N/A  History of Adenoma - Now for follow-up colonoscopy & has been > or = to 3 yrs.  N/A ASA CLASS:   Class II INDICATIONS:lower abdominal pain, intermittent loose stools, abdominal bloating, history of diverticulitis. MEDICATIONS: Monitored anesthesia care, Propofol 400 mg IV, and this was the total dose used for all procedures at this session  DESCRIPTION OF PROCEDURE:   After the risks benefits and alternatives of the procedure were thoroughly explained, informed consent was obtained.  The digital rectal exam revealed no rectal mass.   The LB [id number]  endoscope was introduced through the anus and advanced to the terminal ileum which was intubated for a short distance. No adverse events experienced.   The quality of the prep was (Suprep was used) good.  The instrument was then slowly withdrawn as the colon was fully examined.     COLON FINDINGS: The examined terminal ileum appeared to be normal. There was moderate diverticulosis noted in the descending colon and sigmoid colon with associated muscular hypertrophy.   The examination was otherwise normal.  Retroflexion was not performed due to a narrow rectal vault. The time to cecum = 2.4 Withdrawal time = 9.4   The scope was withdrawn and the procedure completed.  COMPLICATIONS: There were no immediate complications.  ENDOSCOPIC IMPRESSION: 1.   The examined terminal ileum appeared to be normal 2.   There was moderate diverticulosis noted in the descending colon and sigmoid colon 3.   The examination was  otherwise normal  RECOMMENDATIONS: 1.  Daily Benefiber and probiotic 2.  Office followup with me next available 3.  Repeat colonoscopy at age 36 for screening  eSigned:  Jerene Bears, MD 03/08/2015 4:21 PM   cc:  the patient, PCP

## 2015-03-08 NOTE — Patient Instructions (Signed)

## 2015-03-08 NOTE — Progress Notes (Signed)
Report to PACU, RN, vss, BBS= Clear.  

## 2015-03-08 NOTE — Progress Notes (Signed)
Called to room to assist during endoscopic procedure.  Patient ID and intended procedure confirmed with present staff. Received instructions for my participation in the procedure from the performing physician.  

## 2015-03-09 ENCOUNTER — Telehealth: Payer: Self-pay | Admitting: *Deleted

## 2015-03-09 NOTE — Telephone Encounter (Signed)
  Follow up Call-  Call back number 03/08/2015  Post procedure Call Back phone  # [phone]  Permission to leave phone message Yes   [facility]

## 2015-03-15 ENCOUNTER — Encounter: Payer: Self-pay | Admitting: *Deleted

## 2015-03-16 ENCOUNTER — Encounter: Payer: Self-pay | Admitting: Internal Medicine

## 2015-03-21 ENCOUNTER — Telehealth: Payer: Self-pay | Admitting: Internal Medicine

## 2015-03-21 NOTE — Telephone Encounter (Signed)
Spoke with pt and she is aware.

## 2015-04-06 ENCOUNTER — Ambulatory Visit (INDEPENDENT_AMBULATORY_CARE_PROVIDER_SITE_OTHER): Payer: BLUE CROSS/BLUE SHIELD | Admitting: Internal Medicine

## 2015-04-06 ENCOUNTER — Encounter: Payer: Self-pay | Admitting: Internal Medicine

## 2015-04-06 VITALS — BP 116/68 | HR 68 | Ht 60.5 in | Wt 184.4 lb

## 2015-04-06 DIAGNOSIS — R140 Abdominal distension (gaseous): Secondary | ICD-10-CM

## 2015-04-06 DIAGNOSIS — K219 Gastro-esophageal reflux disease without esophagitis: Secondary | ICD-10-CM | POA: Diagnosis not present

## 2015-04-06 DIAGNOSIS — K589 Irritable bowel syndrome without diarrhea: Secondary | ICD-10-CM | POA: Diagnosis not present

## 2015-04-06 NOTE — Patient Instructions (Signed)
Continue your Benefiber daily and Florastor twice daily.   Avoid antibiotics when possible.

## 2015-04-06 NOTE — Progress Notes (Signed)
   Subjective:    Patient ID: Rhonda Porter, female    DOB: 12-06-1970, 44 y.o.   MRN: [id number]  HPI Rhonda Porter is a 44 year old female with a past medical history of IBS with bloating, history of diverticulosis with hemorrhage in 2010 who seen for follow-up. She was seen in the office several months ago and then came for upper endoscopy and colonoscopy to evaluate ongoing issues with left-sided abdominal pain, intermittent loose stools and bloating. These tests were performed on 03/08/2015. Upper endoscopy showed mild reflux esophagitis but was otherwise normal. Gastric biopsies showed chronic inactive gastritis without H. pylori dysplasia or malignancy. Colonoscopy to the terminal ileum showed normal ileal mucosa. Moderate diverticulosis in the left colon but was otherwise normal. I recommended daily Benefiber supplementation and florastor 250 twice a day. She has been using these on a scheduled basis and has had much improvement in her abdominal bloating, abdominal pain and loose stools. Of note she was treated with rifaximin twice in the last 6 months initially with great result the second with less optimal results. She was given azithromycin recently for sinusitis and this did cause some disruption in her movements but she is now off antibodies. Overall she's been very happy with her results. No nausea or vomiting. No trouble swallowing. No blood in her stool or melena.  Review of Systems As per history of present illness, otherwise negative  Current Medications, Allergies, Past Medical History, Past Surgical History, Family History and Social History were reviewed in [employer] record.     Objective:   Physical Exam BP 116/68 mmHg  Pulse 68  Ht 5' 0.5" (1.537 m)  Wt 184 lb 6 oz (83.632 kg)  BMI 35.40 kg/m2 Constitutional: Well-developed and well-nourished. No distress. HEENT: Normocephalic and atraumatic.Conjunctivae are normal.  No scleral icterus. Neck: Neck  supple. Trachea midline. Cardiovascular: Normal rate, regular rhythm and intact distal pulses. No M/R/G Pulmonary/chest: Effort normal and breath sounds normal. No wheezing, rales or rhonchi. Abdominal: Soft, nontender, nondistended. Bowel sounds active throughout. There are no masses palpable.  Extremities: no clubbing, cyanosis, or edema Neurological: Alert and oriented to person place and time. Psychiatric: Normal mood and affect. Behavior is normal.  Lab Results  Component Value Date   TSH 1.15 05/19/2014       Assessment & Plan:  44 year old female with a past medical history of IBS with bloating, history of diverticulosis with hemorrhage in 2010 who seen for follow-up.  1. IBS/abdominal bloating/intermittent loose stools -- all symptoms improved with Benefiber and probiotic. I recommend she continue with Benefiber uninterrupted on a daily basis along with Florastor 250 twice a day. We discussed how antibody disrupt overall bacterial gut flora and I would expect more irritable symptoms during and after antibody therapy. I asked that she only uses antibodies when absolutely necessary for documented infection and continue probiotic during this time. She voices understanding.  2. GERD -- symptoms managed with dietary modification. She is happy with this plan  Follow-up in one year, sooner if necessary 25 minutes spent with the patient today discussing the above issues

## 2015-05-05 ENCOUNTER — Encounter: Payer: Self-pay | Admitting: Internal Medicine

## 2015-06-15 ENCOUNTER — Ambulatory Visit (INDEPENDENT_AMBULATORY_CARE_PROVIDER_SITE_OTHER): Payer: BLUE CROSS/BLUE SHIELD | Admitting: Internal Medicine

## 2015-06-15 ENCOUNTER — Encounter: Payer: Self-pay | Admitting: Internal Medicine

## 2015-06-15 VITALS — BP 116/78 | HR 80

## 2015-06-15 DIAGNOSIS — K589 Irritable bowel syndrome without diarrhea: Secondary | ICD-10-CM | POA: Diagnosis not present

## 2015-06-15 DIAGNOSIS — K2100 Gastro-esophageal reflux disease with esophagitis, without bleeding: Secondary | ICD-10-CM

## 2015-06-15 MED ORDER — PANTOPRAZOLE SODIUM 40 MG PO TBEC: 40.0000 mg | DELAYED_RELEASE_TABLET | Freq: Every day | ORAL | Status: DC

## 2015-06-15 NOTE — Progress Notes (Signed)
   Subjective:    Patient ID: Rhonda Porter, female    DOB: September 07, 1971, 44 y.o.   MRN: [id number]  HPI Rhonda Porter is a 44 year old female with a past medical history of IBS with bloating, history of diverticular hemorrhage who is seen to evaluate dysphagia. She is here alone today. She reports over the last 1-2 months having developed trouble swallowing. She reports this is with both solids and liquids. Is worse with cold liquids. She reports heartburn rarely and this is not a daily thing. While eating or drinking she reports it feels like the food transits very slowly into her stomach occasionally stopping and needing to be coughed up. No weight loss. No nausea or vomiting. Bowels are working regularly without further loose stools. No bleeding or melena. She does continue to use Benefiber and Florastor on a regular basis. She rarely needs Bentyl.  She did have an upper endoscopy which was performed in May 2016. This revealed mild reflux esophagitis and was otherwise normal. No stricture was seen. The Z line was regular without evidence of Barrett's esophagus.   Review of Systems As per history of present illness, otherwise negative  Current Medications, Allergies, Past Medical History, Past Surgical History, Family History and Social History were reviewed in [employer] record.     Objective:   Physical Exam BP 116/78 mmHg  Pulse 80 Constitutional: Well-developed and well-nourished. No distress. HEENT: Normocephalic and atraumatic.  Conjunctivae are normal.  No scleral icterus. Neck: Neck supple. Trachea midline. Cardiovascular: Normal rate, regular rhythm and intact distal pulses. No M/R/G Pulmonary/chest: Effort normal and breath sounds normal. No wheezing, rales or rhonchi. Abdominal: Soft, nontender, nondistended. Bowel sounds active throughout. Extremities: no clubbing, cyanosis, or edema Neurological: Alert and oriented to person place and time. Skin: Skin is  warm and dry. Healing lesions left arm, recent therapy by dermatology Psychiatric: Normal mood and affect. Behavior is normal.     Assessment & Plan:  44 year old with history of IBS, diverticular hemorrhage presenting with dysphagia most consistent with reflux esophagitis  1. Reflux esophagitis -- symptoms consistent with reflux disease. Cannot exclude eosinophilic esophagitis. Upper endoscopy recently did not show severe pathology, Barrett's esophagus or any obstructive lesions. I expect this will improve completely with PPI therapy. Begin pantoprazole 40 mg daily. Would like her to use this for 6-8 weeks but call if there is no improvement after 2 weeks. If no improvement would likely perform barium swallow with tablet and consider empiric EoE therapy. I also expect she is having element of esophageal spasm from uncontrolled reflux. We discussed this at length today. Greater than 25 minutes spent with the patient  2. IBS -- symptoms well controlled with current regimen of Benefiber and probiotic. Continue both.

## 2015-06-15 NOTE — Patient Instructions (Addendum)
You have been given a separate informational sheet regarding your tobacco use, the importance of quitting and local resources to help you quit.  We have sent the following medications to your pharmacy for you to pick up at your convenience: Pantoprazole  Please call our office in 2 weeks if you are no better.

## 2015-07-14 ENCOUNTER — Telehealth: Payer: Self-pay | Admitting: Internal Medicine

## 2015-07-14 NOTE — Telephone Encounter (Signed)
Per OV note pt was to continue the protonix for 6-8 weeks. Pt aware.

## 2016-01-26 ENCOUNTER — Telehealth: Payer: Self-pay | Admitting: Internal Medicine

## 2016-01-27 ENCOUNTER — Other Ambulatory Visit: Payer: Self-pay

## 2016-01-27 MED ORDER — GLYCOPYRROLATE 2 MG PO TABS: 2.0000 mg | ORAL_TABLET | Freq: Two times a day (BID) | ORAL | Status: DC

## 2016-01-27 NOTE — Telephone Encounter (Signed)
Patient calling in again regarding this. Best # [phone]

## 2016-01-27 NOTE — Telephone Encounter (Signed)
Spoke with pt and she is aware, script sent to pharmacy. 

## 2016-01-27 NOTE — Telephone Encounter (Signed)
Pt states Dr. Hilarie Fredrickson gave her bentyl to take for her abdominal cramping. States it is not helping now and she would like something called in. States she has an appt with Dr. Hilarie Fredrickson on Monday but can't wait until then. Please advise.

## 2016-01-27 NOTE — Telephone Encounter (Signed)
Discontinue dicyclomine Previously probiotic and Benefiber has been helpful for her IBS Can be given a trial of Robinul forte 2 mg every 12 hours as needed

## 2016-01-30 ENCOUNTER — Ambulatory Visit (INDEPENDENT_AMBULATORY_CARE_PROVIDER_SITE_OTHER): Payer: BLUE CROSS/BLUE SHIELD | Admitting: Internal Medicine

## 2016-01-30 ENCOUNTER — Encounter: Payer: Self-pay | Admitting: Internal Medicine

## 2016-01-30 VITALS — BP 112/78 | HR 68 | Ht 65.0 in | Wt 171.0 lb

## 2016-01-30 DIAGNOSIS — K589 Irritable bowel syndrome without diarrhea: Secondary | ICD-10-CM | POA: Diagnosis not present

## 2016-01-30 DIAGNOSIS — K219 Gastro-esophageal reflux disease without esophagitis: Secondary | ICD-10-CM

## 2016-01-30 NOTE — Progress Notes (Signed)
   Subjective:    Patient ID: Rhonda Porter, female    DOB: 1971/01/11, 45 y.o.   MRN: [id number]  HPI  Rhonda Porter is a 45 year old female with a history of IBS, history of diverticular bleed remotely, and GERD who is here for follow-up. Last seen in August 2016. She reports that she's had a very good year from an irritable bowel standpoint. She has not had to miss work as often. That said, last week she developed loose watery stools followed by lower abdominal cramping which was intense. This lasted 1-2 days and has resolved. Bowel movements have not yet come completely to normal but she has not seen blood in her stool or melena. This  Felt very consistent with prior IBS flare. At last visit she was having issue with rare heartburn and mild dysphagia. She was treated with pantoprazole for 2 months for probable reflux esophagitis. This helped and she has had no further dysphagia or odynophagia. No nausea or vomiting. No early satiety. Appetite is been good. She is off PPI. She has continued daily Benefiber and twice daily Florastor with good result. She did try Bentyl for the lower abdominal cramping with no relief. She called the office and I prescribed Robinul Forte which she filled but has not yet needed to use.   Review of Systems  as per history of present illness, otherwise negative  Current Medications, Allergies, Past Medical History, Past Surgical History, Family History and Social History were reviewed in [employer] record.     Objective:   Physical Exam BP 112/78 mmHg  Pulse 68  Ht 5\' 5"  (1.651 m)  Wt 171 lb (77.565 kg)  BMI 28.46 kg/m2  SpO2 99% Constitutional: Well-developed and well-nourished. No distress. HEENT: Normocephalic and atraumatic. Oropharynx is clear and moist. No oropharyngeal exudate. Conjunctivae are normal.  No scleral icterus. Neck: Neck supple. Trachea midline. Cardiovascular: Normal rate, regular rhythm and intact distal pulses. No  M/R/G Pulmonary/chest: Effort normal and breath sounds normal. No wheezing, rales or rhonchi. Abdominal: Soft, nontender, nondistended. Bowel sounds active throughout. There are no masses palpable. No hepatosplenomegaly. Extremities: no clubbing, cyanosis, or edema Lymphadenopathy: No cervical adenopathy noted. Neurological: Alert and oriented to person place and time. Skin: Skin is warm and dry. No rashes noted. Psychiatric: Normal mood and affect. Behavior is normal.      Assessment & Plan:  45 year old female with a history of IBS, history of diverticular bleed remotely, and GERD who is here for follow-up.   1.  IBS --  Chronic with recent flare. She does well with Benefiber and Florastor, we will continue both. She will continue dietary modification. She did respond very favorably to rifaximin previously which can be reused if symptoms become more persistent. She will use Robinul Forte 2 mg every 12 hours if needed for lower abdominal crampy pain.   2. GERD with history of esophagitis --  Rare with no esophagitis symptoms currently. Over-the-counter Zantac can be used on an as-needed basis. She is off of PPI  -- Work paperwork filled out for [employer] in the event of IBS flare  15 minutes spent with the patient today. Greater than 50% was spent in counseling and coordination of care with the patient

## 2016-01-30 NOTE — Patient Instructions (Signed)
Please follow up with Dr Hilarie Fredrickson in 1 year.  Continue current medications.  We have filled out FMLA forms for you.

## 2016-02-09 DIAGNOSIS — Z6833 Body mass index (BMI) 33.0-33.9, adult: Secondary | ICD-10-CM | POA: Diagnosis not present

## 2016-02-09 DIAGNOSIS — R102 Pelvic and perineal pain: Secondary | ICD-10-CM | POA: Diagnosis not present

## 2016-02-09 DIAGNOSIS — Z01419 Encounter for gynecological examination (general) (routine) without abnormal findings: Secondary | ICD-10-CM | POA: Diagnosis not present

## 2016-02-09 DIAGNOSIS — Z1389 Encounter for screening for other disorder: Secondary | ICD-10-CM | POA: Diagnosis not present

## 2016-02-21 DIAGNOSIS — N831 Corpus luteum cyst of ovary, unspecified side: Secondary | ICD-10-CM | POA: Diagnosis not present

## 2016-02-21 DIAGNOSIS — R109 Unspecified abdominal pain: Secondary | ICD-10-CM | POA: Diagnosis not present

## 2016-04-24 DIAGNOSIS — Z6832 Body mass index (BMI) 32.0-32.9, adult: Secondary | ICD-10-CM | POA: Diagnosis not present

## 2016-04-24 DIAGNOSIS — J039 Acute tonsillitis, unspecified: Secondary | ICD-10-CM | POA: Diagnosis not present

## 2016-05-02 DIAGNOSIS — J3089 Other allergic rhinitis: Secondary | ICD-10-CM | POA: Diagnosis not present

## 2016-05-03 DIAGNOSIS — D225 Melanocytic nevi of trunk: Secondary | ICD-10-CM | POA: Diagnosis not present

## 2016-05-03 DIAGNOSIS — L821 Other seborrheic keratosis: Secondary | ICD-10-CM | POA: Diagnosis not present

## 2016-06-26 DIAGNOSIS — N63 Unspecified lump in breast: Secondary | ICD-10-CM | POA: Diagnosis not present

## 2016-07-13 DIAGNOSIS — J209 Acute bronchitis, unspecified: Secondary | ICD-10-CM | POA: Diagnosis not present

## 2016-07-13 DIAGNOSIS — Z6832 Body mass index (BMI) 32.0-32.9, adult: Secondary | ICD-10-CM | POA: Diagnosis not present

## 2016-08-03 DIAGNOSIS — Z23 Encounter for immunization: Secondary | ICD-10-CM | POA: Diagnosis not present

## 2016-09-06 DIAGNOSIS — J3089 Other allergic rhinitis: Secondary | ICD-10-CM | POA: Diagnosis not present

## 2016-11-19 DIAGNOSIS — L820 Inflamed seborrheic keratosis: Secondary | ICD-10-CM | POA: Diagnosis not present

## 2016-11-19 DIAGNOSIS — D485 Neoplasm of uncertain behavior of skin: Secondary | ICD-10-CM | POA: Diagnosis not present

## 2016-11-19 DIAGNOSIS — Z1283 Encounter for screening for malignant neoplasm of skin: Secondary | ICD-10-CM | POA: Diagnosis not present

## 2016-11-19 DIAGNOSIS — L304 Erythema intertrigo: Secondary | ICD-10-CM | POA: Diagnosis not present

## 2016-11-19 DIAGNOSIS — L308 Other specified dermatitis: Secondary | ICD-10-CM | POA: Diagnosis not present

## 2016-11-19 DIAGNOSIS — D225 Melanocytic nevi of trunk: Secondary | ICD-10-CM | POA: Diagnosis not present

## 2016-12-03 DIAGNOSIS — Z85820 Personal history of malignant melanoma of skin: Secondary | ICD-10-CM | POA: Diagnosis not present

## 2016-12-03 DIAGNOSIS — Z08 Encounter for follow-up examination after completed treatment for malignant neoplasm: Secondary | ICD-10-CM | POA: Diagnosis not present

## 2016-12-03 DIAGNOSIS — Z1283 Encounter for screening for malignant neoplasm of skin: Secondary | ICD-10-CM | POA: Diagnosis not present

## 2016-12-18 ENCOUNTER — Other Ambulatory Visit: Payer: Self-pay | Admitting: *Deleted

## 2016-12-18 ENCOUNTER — Ambulatory Visit (INDEPENDENT_AMBULATORY_CARE_PROVIDER_SITE_OTHER): Payer: BLUE CROSS/BLUE SHIELD

## 2016-12-18 ENCOUNTER — Encounter: Payer: Self-pay | Admitting: Podiatry

## 2016-12-18 ENCOUNTER — Ambulatory Visit (INDEPENDENT_AMBULATORY_CARE_PROVIDER_SITE_OTHER): Payer: BLUE CROSS/BLUE SHIELD | Admitting: Podiatry

## 2016-12-18 VITALS — BP 129/79 | HR 83 | Resp 16

## 2016-12-18 DIAGNOSIS — M722 Plantar fascial fibromatosis: Secondary | ICD-10-CM | POA: Diagnosis not present

## 2016-12-18 DIAGNOSIS — M7062 Trochanteric bursitis, left hip: Secondary | ICD-10-CM | POA: Diagnosis not present

## 2016-12-18 DIAGNOSIS — M25552 Pain in left hip: Secondary | ICD-10-CM | POA: Diagnosis not present

## 2016-12-18 DIAGNOSIS — Z6831 Body mass index (BMI) 31.0-31.9, adult: Secondary | ICD-10-CM | POA: Diagnosis not present

## 2016-12-18 MED ORDER — MELOXICAM 15 MG PO TABS: 15.0000 mg | ORAL_TABLET | Freq: Every day | ORAL | 3 refills | Status: DC

## 2016-12-18 NOTE — Patient Instructions (Signed)

## 2016-12-18 NOTE — Progress Notes (Signed)
   Subjective:    Patient ID: Rhonda Porter, female    DOB: Jan 10, 1971, 46 y.o.   MRN: [id number]  HPI: She presents today with chief complaint of plain to the plantar medial heel right foot. She states that it feels like it's pulling up the back of the foot as well. She states that she has a history of diverticulitis is hard to take anti-inflammatories.  Review of Systems  Musculoskeletal: Positive for arthralgias and gait problem.  All other systems reviewed and are negative.      Objective:   Physical Exam: Vital signs are stable alert and oriented 3. Pulses are palpable. Neurologic sensorium is intact. Each and reflexes are intact. Muscle strength is normal. Orthopedic evaluation of strength all joints distal to the ankle for range of motion or crepitus pain with palpation may take interval of the right heel. Radiographs taken today demonstrate soft tissue increase in density at the plantar fascia insertion site indicative of plantar fasciitis. Muscle strength is normal deep tendon reflexes are normal cutaneous evaluation does not demonstrate any open wounds or lesions.        Assessment & Plan:  Assessment: Plantar fasciitis right heel.  Plan: I injected the right heel today with Kenalog and local anesthetics start her on a Medrol Dosepak to be followed by meloxicam. If the meloxicam starts to cause GI distress she will discontinue. M plantar fascia brace and a night splint. Discussed appropriate shoe gear stretching exercises and ice therapy. Follow up with her in 1 month

## 2017-01-14 ENCOUNTER — Ambulatory Visit (INDEPENDENT_AMBULATORY_CARE_PROVIDER_SITE_OTHER): Payer: BLUE CROSS/BLUE SHIELD | Admitting: Internal Medicine

## 2017-01-14 ENCOUNTER — Encounter: Payer: Self-pay | Admitting: Internal Medicine

## 2017-01-14 VITALS — BP 116/84 | HR 96 | Ht 60.5 in | Wt 173.0 lb

## 2017-01-14 DIAGNOSIS — K589 Irritable bowel syndrome without diarrhea: Secondary | ICD-10-CM | POA: Diagnosis not present

## 2017-01-14 MED ORDER — SACCHAROMYCES BOULARDII 250 MG PO CAPS: 250.0000 mg | ORAL_CAPSULE | Freq: Two times a day (BID) | ORAL | 11 refills | Status: DC

## 2017-01-14 MED ORDER — GLYCOPYRROLATE 2 MG PO TABS: 2.0000 mg | ORAL_TABLET | Freq: Two times a day (BID) | ORAL | 3 refills | Status: DC | PRN

## 2017-01-14 NOTE — Progress Notes (Signed)
   Subjective:    Patient ID: Rhonda Porter, female    DOB: June 16, 1971, 46 y.o.   MRN: [id number]  HPI Rhonda Porter is a 46 year old female with history of IBS, history of diverticular bleed and GERD who is here for follow-up. She was last seen in April 2017. She reports that overall she has been doing well. She's been dealing with left hip pain as well as right foot plantar fasciitis. Her plantar fascia has been recently injected and seems to be improving. She somewhat down because she had been riding a stationary bike for over 30 minutes every day and with this exercise has lost weight. With her hip pain she's been unable to ride the bike and has gained some weight back recently. This is frustrating for her.  From an IBS standpoint she's been doing very well. She has intermittent flares of abdominal discomfort occasionally causing her to leave work early. She can develop loose stools with lower abdominal cramping. Recently this is been well controlled. She's had no further reflux symptoms including no heartburn and no further dysphagia or odynophagia. Appetite is been good. She's been off PPI and H2 blocker. Bowel movements for the most part are regular without blood in her stool or melena. She keeps Robinul Forte on hand and is requesting refill but rarely needs it. She does feel Florastor 250 mg daily helps her tremendously with her IBS  Review of Systems As per history of present illness, otherwise negative  Current Medications, Allergies, Past Medical History, Past Surgical History, Family History and Social History were reviewed in [employer] record.      Objective:   Physical Exam BP 116/84   Pulse 96   Ht 5' 0.5" (1.537 m)   Wt 173 lb (78.5 kg)   BMI 33.23 kg/m  Constitutional: Well-developed and well-nourished. No distress. HEENT: Normocephalic and atraumatic.   Conjunctivae are normal.  No scleral icterus. Neck: Neck supple. Trachea midline. Cardiovascular:  Normal rate, regular rhythm and intact distal pulses. No M/R/G Pulmonary/chest: Effort normal and breath sounds normal. No wheezing, rales or rhonchi. Abdominal: Soft, nontender, nondistended. Bowel sounds active throughout.   Extremities: no clubbing, cyanosis, or edema Neurological: Alert and oriented to person place and time. Skin: Skin is warm and dry.   Psychiatric: Normal mood and affect. Behavior is normal.     Assessment & Plan:  46 year old female with history of IBS, history of diverticular bleed and GERD who is here for follow-up  1.  IBS -- Doing well without recent major flare. We'll continue Florastor currently 250 mg once a day. Continue Benefiber. Can use Robinul Forte 2 mg every 12 hours as needed for lower abdominal crampy pain or loose stools.  2. GERD -- not a recent issue with no alarm symptom. Can use over-the-counter Pepcid or Zantac as needed per box instruction  --Paperwork filled out today for FMLA in the event of IBS flare requiring shortened workday  15 minutes spent with the patient today. Greater than 50% was spent in counseling and coordination of care with the patient

## 2017-01-14 NOTE — Patient Instructions (Signed)
We have sent the following medications to your pharmacy for you to pick up at your convenience: Robinul every 12 hours as needed  We have given you a prescription of Florastor to take to your pharmacy.  Please follow up with Dr Hilarie Fredrickson in 1 year.  We have faxed your FMLA papers for you and have given you originals to keep for your records.  If you are age 46 or older, your body mass index should be between 23-30. Your Body mass index is 33.23 kg/m. If this is out of the aforementioned range listed, please consider follow up with your Primary Care Provider.  If you are age 61 or younger, your body mass index should be between 19-25. Your Body mass index is 33.23 kg/m. If this is out of the aformentioned range listed, please consider follow up with your Primary Care Provider.

## 2017-01-15 ENCOUNTER — Encounter: Payer: Self-pay | Admitting: Podiatry

## 2017-01-15 ENCOUNTER — Ambulatory Visit (INDEPENDENT_AMBULATORY_CARE_PROVIDER_SITE_OTHER): Payer: BLUE CROSS/BLUE SHIELD | Admitting: Podiatry

## 2017-01-15 DIAGNOSIS — M722 Plantar fascial fibromatosis: Secondary | ICD-10-CM | POA: Diagnosis not present

## 2017-01-15 NOTE — Progress Notes (Signed)
She presents today for follow-up of her plantar fasciitis she states that she is doing everything as she is supposed to but though she is doing much better the heel still throbs every day particularly in the evening when she goes to bed.   objective: Vital signs are stable she is alert and oriented 3. She has strong palpable pulses bilateral. She is pain on palpation medial tubercle of the right heel the much less pain than previously noted.  Assessment: Plantar fasciitis right.  Plan: Discussed etiology pathology conservative versus surgical therapies. Injected the right heel. Continue all other conservative therapies follow up with me in 1 month.

## 2017-02-12 ENCOUNTER — Encounter: Payer: Self-pay | Admitting: Podiatry

## 2017-02-12 ENCOUNTER — Ambulatory Visit (INDEPENDENT_AMBULATORY_CARE_PROVIDER_SITE_OTHER): Payer: BLUE CROSS/BLUE SHIELD | Admitting: Podiatry

## 2017-02-12 DIAGNOSIS — M722 Plantar fascial fibromatosis: Secondary | ICD-10-CM | POA: Diagnosis not present

## 2017-02-12 MED ORDER — DICLOFENAC SODIUM 1 % TD GEL: 4.0000 g | Freq: Four times a day (QID) | TRANSDERMAL | 3 refills | Status: DC

## 2017-02-12 NOTE — Progress Notes (Signed)
She presents today for follow-up of her plantar fasciitis to her right heel. She states that he has improved some but she started to develop some tingling in numbness to the plantar aspect of the foot extending to the toes. She states that seems to be related to the plantar fascia brace. It always seems to be bothering her when she takes the brace off. She continues to wear the night splint which she gets a lot more relief out of.  Objective: Vital signs are stable alert and oriented 3. Pulses are palpable. She has pain on palpation medial continue tubercle of the right heel much decreased from previous evaluations.  Assessment: Plantar fasciitis intractable.  Plan: She will be casted for orthotics today. I also prescribed diclofenac since she cannot take oral medication.

## 2017-02-22 DIAGNOSIS — R51 Headache: Secondary | ICD-10-CM | POA: Diagnosis not present

## 2017-02-22 DIAGNOSIS — H6501 Acute serous otitis media, right ear: Secondary | ICD-10-CM | POA: Diagnosis not present

## 2017-02-22 DIAGNOSIS — Z6831 Body mass index (BMI) 31.0-31.9, adult: Secondary | ICD-10-CM | POA: Diagnosis not present

## 2017-03-05 ENCOUNTER — Other Ambulatory Visit: Payer: BLUE CROSS/BLUE SHIELD

## 2017-03-07 DIAGNOSIS — N951 Menopausal and female climacteric states: Secondary | ICD-10-CM | POA: Diagnosis not present

## 2017-03-07 DIAGNOSIS — Z1389 Encounter for screening for other disorder: Secondary | ICD-10-CM | POA: Diagnosis not present

## 2017-03-07 DIAGNOSIS — Z780 Asymptomatic menopausal state: Secondary | ICD-10-CM | POA: Diagnosis not present

## 2017-03-07 DIAGNOSIS — Z01419 Encounter for gynecological examination (general) (routine) without abnormal findings: Secondary | ICD-10-CM | POA: Diagnosis not present

## 2017-03-07 DIAGNOSIS — Z6829 Body mass index (BMI) 29.0-29.9, adult: Secondary | ICD-10-CM | POA: Diagnosis not present

## 2017-03-07 DIAGNOSIS — Z130 Encounter for screening for diseases of the blood and blood-forming organs and certain disorders involving the immune mechanism: Secondary | ICD-10-CM | POA: Diagnosis not present

## 2017-03-08 ENCOUNTER — Other Ambulatory Visit: Payer: Self-pay

## 2017-03-08 DIAGNOSIS — J3089 Other allergic rhinitis: Secondary | ICD-10-CM | POA: Diagnosis not present

## 2017-03-08 DIAGNOSIS — Z713 Dietary counseling and surveillance: Secondary | ICD-10-CM | POA: Diagnosis not present

## 2017-03-08 DIAGNOSIS — M167 Other unilateral secondary osteoarthritis of hip: Secondary | ICD-10-CM | POA: Diagnosis not present

## 2017-03-08 MED ORDER — NONFORMULARY OR COMPOUNDED ITEM: 120.0000 g | Freq: Four times a day (QID) | 3 refills | Status: DC

## 2017-03-28 ENCOUNTER — Ambulatory Visit (INDEPENDENT_AMBULATORY_CARE_PROVIDER_SITE_OTHER): Payer: BLUE CROSS/BLUE SHIELD | Admitting: Podiatry

## 2017-03-28 ENCOUNTER — Telehealth: Payer: Self-pay | Admitting: *Deleted

## 2017-03-28 ENCOUNTER — Encounter: Payer: Self-pay | Admitting: Podiatry

## 2017-03-28 DIAGNOSIS — M722 Plantar fascial fibromatosis: Secondary | ICD-10-CM | POA: Diagnosis not present

## 2017-03-28 NOTE — Telephone Encounter (Signed)
"  I came in this morning and spoke to you about Dr. Milinda Pointer doing my surgery on the 15th.  If the 8th is still available, give me a call and let me know."

## 2017-03-28 NOTE — Telephone Encounter (Signed)
I called and informed patient that I rescheduled her surgery from 04/12/2017 to 04/05/2017.

## 2017-03-28 NOTE — Patient Instructions (Signed)
Pre-Operative Instructions  Congratulations, you have decided to take an important step to improving your quality of life.  You can be assured that the doctors of [facility] will be with you every step of the way.  1. Plan to be at the surgery center/hospital at least 1 (one) hour prior to your scheduled time unless otherwise directed by the surgical center/hospital staff.  You must have a responsible adult accompany you, remain during the surgery and drive you home.  Make sure you have directions to the surgical center/hospital and know how to get there on time. 2. For hospital based surgery you will need to obtain a history and physical form from your family physician within 1 month prior to the date of surgery- we will give you a form for you primary physician.  3. We make every effort to accommodate the date you request for surgery.  There are however, times where surgery dates or times have to be moved.  We will contact you as soon as possible if a change in schedule is required.   4. No Aspirin/Ibuprofen for one week before surgery.  If you are on aspirin, any non-steroidal anti-inflammatory medications (Mobic, Aleve, Ibuprofen) you should stop taking it 7 days prior to your surgery.  You make take Tylenol  For pain prior to surgery.  5. Medications- If you are taking daily heart and blood pressure medications, seizure, reflux, allergy, asthma, anxiety, pain or diabetes medications, make sure the surgery center/hospital is aware before the day of surgery so they may notify you which medications to take or avoid the day of surgery. 6. No food or drink after midnight the night before surgery unless directed otherwise by surgical center/hospital staff. 7. No alcoholic beverages 24 hours prior to surgery.  No smoking 24 hours prior to or 24 hours after surgery. 8. Wear loose pants or shorts- loose enough to fit over bandages, boots, and casts. 9. No slip on shoes, sneakers are best. 10. Bring  your boot with you to the surgery center/hospital.  Also bring crutches or a walker if your physician has prescribed it for you.  If you do not have this equipment, it will be provided for you after surgery. 11. If you have not been contracted by the surgery center/hospital by the day before your surgery, call to confirm the date and time of your surgery. 12. Leave-time from work may vary depending on the type of surgery you have.  Appropriate arrangements should be made prior to surgery with your employer. 13. Prescriptions will be provided immediately following surgery by your doctor.  Have these filled as soon as possible after surgery and take the medication as directed. 14. Remove nail polish on the operative foot. 15. Wash the night before surgery.  The night before surgery wash the foot and leg well with the antibacterial soap provided and water paying special attention to beneath the toenails and in between the toes.  Rinse thoroughly with water and dry well with a towel.  Perform this wash unless told not to do so by your physician.  Enclosed: 1 Ice pack (please put in freezer the night before surgery)   1 Hibiclens skin cleaner   Pre-op Instructions  If you have any questions regarding the instructions, do not hesitate to call our office.  [place]: [address] [place], [place] [address] [phone]  [place]: [address] [place], [place] [address] [phone]  [place]: [address]  [place], [place] [address] [phone]   Dr.   Norman Regal DPM, Dr. Matthew Wagoner DPM, Dr. M. Todd Eisa Necaise DPM, Dr. Titorya Stover DPM 

## 2017-03-28 NOTE — Progress Notes (Signed)
She presents today for follow-up of her right heel pain. She states that she really is not getting any better now but that her surgical intervention.  Objective: Vital signs are stable she is alert and oriented 3 pulses are strongly palpable. Neurologic sensorium is intact. She has severe pain on palpation medially or drainage of the right heel.  Assessment: Plantar fasciitis chronic in nature.  Plan: Reinjected the right heel today with local local anesthetic. Consented her for an endoscopic plantar fasciotomy. We went over this in great detail today she understands and is amenable to it we discussed the possible postop complications which may include but are not limited to postop pain bleeding swelling infection recurrence need for further surgery overcorrection under correction loss of arch height and chronic pain. She saw Dr. patient's consent form we discussed surgery center as well as the anesthesia entirety will be used we dispensed a cam boot or her today and I will follow-up with her in the near future for surgery.

## 2017-04-02 ENCOUNTER — Ambulatory Visit: Payer: BLUE CROSS/BLUE SHIELD | Admitting: Podiatry

## 2017-04-03 ENCOUNTER — Other Ambulatory Visit: Payer: Self-pay | Admitting: Podiatry

## 2017-04-03 MED ORDER — CLINDAMYCIN HCL 150 MG PO CAPS: 150.0000 mg | ORAL_CAPSULE | Freq: Three times a day (TID) | ORAL | 0 refills | Status: DC

## 2017-04-03 MED ORDER — ONDANSETRON HCL 4 MG PO TABS: 4.0000 mg | ORAL_TABLET | Freq: Three times a day (TID) | ORAL | 0 refills | Status: DC | PRN

## 2017-04-03 MED ORDER — HYDROMORPHONE HCL 4 MG PO TABS: 4.0000 mg | ORAL_TABLET | Freq: Four times a day (QID) | ORAL | 0 refills | Status: DC | PRN

## 2017-04-05 ENCOUNTER — Encounter: Payer: Self-pay | Admitting: Podiatry

## 2017-04-05 DIAGNOSIS — K599 Functional intestinal disorder, unspecified: Secondary | ICD-10-CM | POA: Diagnosis not present

## 2017-04-05 DIAGNOSIS — M722 Plantar fascial fibromatosis: Secondary | ICD-10-CM | POA: Diagnosis not present

## 2017-04-05 DIAGNOSIS — M25571 Pain in right ankle and joints of right foot: Secondary | ICD-10-CM | POA: Diagnosis not present

## 2017-04-11 ENCOUNTER — Ambulatory Visit (INDEPENDENT_AMBULATORY_CARE_PROVIDER_SITE_OTHER): Payer: BLUE CROSS/BLUE SHIELD | Admitting: Podiatry

## 2017-04-11 DIAGNOSIS — M722 Plantar fascial fibromatosis: Secondary | ICD-10-CM

## 2017-04-11 DIAGNOSIS — R1030 Lower abdominal pain, unspecified: Secondary | ICD-10-CM | POA: Insufficient documentation

## 2017-04-11 DIAGNOSIS — R102 Pelvic and perineal pain: Secondary | ICD-10-CM | POA: Insufficient documentation

## 2017-04-11 DIAGNOSIS — N951 Menopausal and female climacteric states: Secondary | ICD-10-CM | POA: Insufficient documentation

## 2017-04-14 NOTE — Progress Notes (Signed)
She presents today status post endoscopic plantar fasciotomy right foot. States that him doing much better really have not had much pain.  Objective: Dry sterile dressing intact was removed demonstrates minimal edema no erythema cellulitis drainage or odor. No signs of infection. Sutures are intact margins are well coapted.  Assessment: Place Band-Aids over the surgical sites. We'll allow her to wash this and get it wet she is to continue the use of her Cam Walker for the next week sutures were removed be removed at that time. She was then get back into her tennis shoes no bare feet no flip flops or sandals. She will also start wearing her night splint or Cam Walker at nighttime for 1 full month.

## 2017-04-18 ENCOUNTER — Ambulatory Visit (INDEPENDENT_AMBULATORY_CARE_PROVIDER_SITE_OTHER): Payer: Self-pay | Admitting: Podiatry

## 2017-04-18 DIAGNOSIS — M722 Plantar fascial fibromatosis: Secondary | ICD-10-CM

## 2017-04-18 NOTE — Progress Notes (Signed)
She presents today for follow-up of her endoscopic plantar fasciotomy left foot. States it has improved but is still little bit sore.  Objective: Vital signs are stable she's alert and oriented 3. Pulses are palpable sutures are intact margins well coapted there is no ecchymosis.  Assessment: Well-healing endoscopic fasciotomy right foot. Sutures were removed today margins well coapted I would allow her to get back into her regular tennis shoes and continue use a night splint. Follow up with her in about 1 month. She'll continue to work until we've released her.

## 2017-04-26 NOTE — Progress Notes (Signed)
DOS 06.08.2018 Endoscopic plantar fasciotomy right foot.

## 2017-05-02 ENCOUNTER — Ambulatory Visit (INDEPENDENT_AMBULATORY_CARE_PROVIDER_SITE_OTHER): Payer: BLUE CROSS/BLUE SHIELD | Admitting: Podiatry

## 2017-05-02 DIAGNOSIS — M722 Plantar fascial fibromatosis: Secondary | ICD-10-CM

## 2017-05-02 NOTE — Progress Notes (Signed)
She presented today for follow-up of her plantar fasciitis right foot. She is status post endoscopic fasciotomy 1 month.  Objective: Vital signs are stable she's alert and oriented 3 no erythema edema cellulitis drainage or odor mildly tender on palpation. Still a little bit of ecchymosis.  Assessment: Well-healed surgical foot. Residual pain.  Plan: I recommend she get back in her regular shoe gear and try to get back in her orthotics and she will get back to work the first couple of weeks while this.

## 2017-05-23 ENCOUNTER — Ambulatory Visit (INDEPENDENT_AMBULATORY_CARE_PROVIDER_SITE_OTHER): Payer: Self-pay | Admitting: Podiatry

## 2017-05-23 ENCOUNTER — Encounter: Payer: Self-pay | Admitting: Podiatry

## 2017-05-23 DIAGNOSIS — M722 Plantar fascial fibromatosis: Secondary | ICD-10-CM

## 2017-05-24 NOTE — Progress Notes (Signed)
She presents today status post endoscopic fasciotomy date of surgery 04/05/2017 right foot. She states it is getting a little better every day.  Objective: Vital signs are stable she is alert and oriented 3. There is no erythema edema cellulitis drainage or odor. Pulses are palpable she has minimal pain on palpation medial calcaneal tubercle of the right heel. Incision sites, he'll uneventfully with no scarring.  Assessment: Well-healing surgical foot right.  Plan: We'll allow her to get back to her regular routine I encouraged her to continue to wear the night splint as often as possible until she has absolutely no pain in the right heel whatsoever. I will follow-up with her in 1 month.  Roselind Messier DPM

## 2017-06-04 ENCOUNTER — Ambulatory Visit (INDEPENDENT_AMBULATORY_CARE_PROVIDER_SITE_OTHER): Payer: BLUE CROSS/BLUE SHIELD | Admitting: Podiatry

## 2017-06-04 ENCOUNTER — Encounter: Payer: Self-pay | Admitting: Podiatry

## 2017-06-04 DIAGNOSIS — M722 Plantar fascial fibromatosis: Secondary | ICD-10-CM

## 2017-06-04 MED ORDER — METHYLPREDNISOLONE 4 MG PO TBPK: ORAL_TABLET | ORAL | 0 refills | Status: DC

## 2017-06-04 NOTE — Progress Notes (Signed)
She presents today status post endoscopic plantar fasciotomy with a chief complaint of pain to her right foot. States the surgical site feels much better however she's had pain to the lateral aspect of the foot. She points to the area around the fifth metatarsal base in the lateral aspect of the right heel. She states that she's had to revert back to her Cam Walker to help alleviate her symptoms.  Objective: Vital signs are stable she is alert and oriented 3 no pain on palpation of the surgical site. She does however have tenderness and pain on palpation of the fifth metatarsal base and the calcaneal cuboid articulation. Mild tenderness on palpation of the peroneal tendons.  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  Plan: At this point we did not perform an injection however I did put her on methylprednisolone. I will follow-up with her in 1 month

## 2017-06-11 ENCOUNTER — Telehealth: Payer: Self-pay | Admitting: Podiatry

## 2017-06-11 NOTE — Telephone Encounter (Signed)
This is [place] with [employer]. I'm calling about a request sent on 09 August and 14 August for pt's records. Calling to make sure you received the requests and did not have any questions. You can call me back at [phone] ext [phone].

## 2017-06-17 ENCOUNTER — Telehealth: Payer: Self-pay | Admitting: Podiatry

## 2017-06-17 NOTE — Telephone Encounter (Signed)
Rhonda Porter with [employer] called requesting work status. Wants to know if she was released without any restrictions and/or limitations or if she is still working light duty 4 hours per day. Can be called back at [phone].

## 2017-06-18 ENCOUNTER — Telehealth: Payer: Self-pay | Admitting: Podiatry

## 2017-06-18 NOTE — Telephone Encounter (Signed)
[employer] with [employer] called with questions about pt's return to work limitations and/or restrictions. Can be called back at [phone] and please reference Claim# [id number].

## 2017-06-19 NOTE — Telephone Encounter (Signed)
Routed note of 06/18/2017 3:21pm from Coralee Pesa to [employer].

## 2017-06-20 ENCOUNTER — Encounter: Payer: Self-pay | Admitting: Podiatry

## 2017-06-20 ENCOUNTER — Ambulatory Visit (INDEPENDENT_AMBULATORY_CARE_PROVIDER_SITE_OTHER): Payer: BLUE CROSS/BLUE SHIELD | Admitting: Podiatry

## 2017-06-20 DIAGNOSIS — M722 Plantar fascial fibromatosis: Secondary | ICD-10-CM

## 2017-06-20 NOTE — Progress Notes (Signed)
She presents today for follow-up of her endoscopic plantar fasciitis of the right foot states that they have have some pain but is more the lateral aspect of the foot now than on the right side.  Objective: Vital signs are stable alert and oriented 3 she has pain on palpation to the peroneal tendon area into the lateral band of plantar fascia right foot.  Assessment: Lateral band plantar fasciitis cannot rule out peroneal tendinitis.  Plan: I reinjected the area today laterally with Kenalog and local anesthetic and I'll follow-up with her in 1 month. I recommended that she continue to wear her night splint.

## 2017-07-10 DIAGNOSIS — Z713 Dietary counseling and surveillance: Secondary | ICD-10-CM | POA: Diagnosis not present

## 2017-07-11 DIAGNOSIS — N6320 Unspecified lump in the left breast, unspecified quadrant: Secondary | ICD-10-CM | POA: Diagnosis not present

## 2017-07-11 DIAGNOSIS — N6310 Unspecified lump in the right breast, unspecified quadrant: Secondary | ICD-10-CM | POA: Diagnosis not present

## 2017-07-23 ENCOUNTER — Encounter: Payer: Self-pay | Admitting: Podiatry

## 2017-07-23 ENCOUNTER — Ambulatory Visit (INDEPENDENT_AMBULATORY_CARE_PROVIDER_SITE_OTHER): Payer: BLUE CROSS/BLUE SHIELD | Admitting: Podiatry

## 2017-07-23 ENCOUNTER — Other Ambulatory Visit: Payer: Self-pay

## 2017-07-23 DIAGNOSIS — M79671 Pain in right foot: Secondary | ICD-10-CM

## 2017-07-23 DIAGNOSIS — M722 Plantar fascial fibromatosis: Secondary | ICD-10-CM

## 2017-07-24 NOTE — Progress Notes (Signed)
She follows up today for a plantar fasciitis and peroneal tendinitis right foot. States the shot helped for about 1 week but the boot relieves the pain 100%. She states the pain is more on the outside of the foot now.  Objective: She has pain on palpation lateral aspect of her right heel. She also has been palpation medial aspect of the right heel.  Assessment: Plantar fasciitis chronic in nature lateral pain probably consistent with peroneal tendinitis or tear.  Plan: Because of failure of conservative therapies to alleviate her symptoms and immobilization being most comfortable I feel that most likely an MRI is necessary to assess for surgical intervention. MRI of the right heel will be performed.

## 2017-07-30 ENCOUNTER — Telehealth: Payer: Self-pay | Admitting: *Deleted

## 2017-07-30 NOTE — Telephone Encounter (Addendum)
Pt states she would like to know who the MRI would be scheduled with, to call for an appt, because if they have been calling her home phone she does not know how to take the messages off. 07/30/2017-I informed pt the orders had been sent to the [facility] [phone].

## 2017-08-01 ENCOUNTER — Ambulatory Visit: Payer: BLUE CROSS/BLUE SHIELD | Admitting: Podiatry

## 2017-08-08 ENCOUNTER — Inpatient Hospital Stay
Admission: RE | Admit: 2017-08-08 | Discharge: 2017-08-08 | Disposition: A | Discharge status: Home / Self Care | Payer: BLUE CROSS/BLUE SHIELD | Source: Ambulatory Visit | Attending: Podiatry | Admitting: Podiatry

## 2017-08-12 ENCOUNTER — Other Ambulatory Visit: Payer: BLUE CROSS/BLUE SHIELD

## 2017-08-14 DIAGNOSIS — Z23 Encounter for immunization: Secondary | ICD-10-CM | POA: Diagnosis not present

## 2017-08-17 ENCOUNTER — Ambulatory Visit
Admission: RE | Admit: 2017-08-17 | Discharge: 2017-08-17 | Disposition: A | Discharge status: Home / Self Care | Payer: BLUE CROSS/BLUE SHIELD | Source: Ambulatory Visit | Attending: Podiatry | Admitting: Podiatry

## 2017-08-17 DIAGNOSIS — M7989 Other specified soft tissue disorders: Secondary | ICD-10-CM | POA: Diagnosis not present

## 2017-08-17 DIAGNOSIS — M79671 Pain in right foot: Secondary | ICD-10-CM

## 2017-08-20 ENCOUNTER — Telehealth: Payer: Self-pay | Admitting: *Deleted

## 2017-08-20 NOTE — Telephone Encounter (Addendum)
-----   Message from Garrel Ridgel, [place] sent at 08/19/2017  8:07 AM EDT ----- Request an over read and inform patient of the delay. 08/20/2017-I informed pt of Dr. Stephenie Acres request for overread and explained the delay. Pt states she has an appt and is having so much pain she had to take a dilaudid last Sunday, and her ibuprofen is not helping. I told pt I would cancel the appt for Thursday and ask Dr. Milinda Pointer if there was anything else she could try until the over read results had returned. Mailed copy of MRI disc to [facility].

## 2017-08-20 NOTE — Telephone Encounter (Signed)
Take the dilaudid and Tylenol.

## 2017-08-20 NOTE — Telephone Encounter (Signed)
I informed pt of Dr. Stephenie Acres orders to continue with the dilaudid and take tylenol, I also encouraged pt to ice the foot 3-4 times daily by placing ice pack on the floor, then place foot on for 15-20 minutes protecting the skin from the ice with a cloth. Pt states she has been using an ice bottle but will try my suggestion.

## 2017-08-22 ENCOUNTER — Telehealth: Payer: Self-pay | Admitting: *Deleted

## 2017-08-22 ENCOUNTER — Ambulatory Visit: Payer: BLUE CROSS/BLUE SHIELD | Admitting: Podiatry

## 2017-08-22 DIAGNOSIS — M779 Enthesopathy, unspecified: Secondary | ICD-10-CM

## 2017-08-22 NOTE — Telephone Encounter (Signed)
I informed pt the MRI overread final result had not come in and Dr. Milinda Pointer states she would benefit from waiting and coming in once finals have arrived. Pt states understanding.

## 2017-08-27 ENCOUNTER — Encounter: Payer: Self-pay | Admitting: Podiatry

## 2017-08-28 NOTE — Telephone Encounter (Addendum)
-----   Message from Garrel Ridgel, [place] sent at 08/28/2017  6:26 AM EDT ----- Schedule her for PT for eval and tx of Peroneal tendinitis and planter fasciitis.  Let patient know that this is what we are going to do and tell her that it is only tendinitis  And not a tear. Left message informing pt Dr. Milinda Pointer had reviewed the results of the MRI and to call for instructions. I informed pt of Dr. Stephenie Acres review of results and orders. I told pt, I would send orders to [facility] and they would contact her to schedule. Faxed required form, and demographics to [facility]

## 2017-08-30 DIAGNOSIS — Z713 Dietary counseling and surveillance: Secondary | ICD-10-CM | POA: Diagnosis not present

## 2017-09-09 DIAGNOSIS — M25571 Pain in right ankle and joints of right foot: Secondary | ICD-10-CM | POA: Diagnosis not present

## 2017-09-09 DIAGNOSIS — R262 Difficulty in walking, not elsewhere classified: Secondary | ICD-10-CM | POA: Diagnosis not present

## 2017-09-09 DIAGNOSIS — M25471 Effusion, right ankle: Secondary | ICD-10-CM | POA: Diagnosis not present

## 2017-09-09 DIAGNOSIS — M25371 Other instability, right ankle: Secondary | ICD-10-CM | POA: Diagnosis not present

## 2017-09-11 DIAGNOSIS — M25371 Other instability, right ankle: Secondary | ICD-10-CM | POA: Diagnosis not present

## 2017-09-11 DIAGNOSIS — R262 Difficulty in walking, not elsewhere classified: Secondary | ICD-10-CM | POA: Diagnosis not present

## 2017-09-11 DIAGNOSIS — M25471 Effusion, right ankle: Secondary | ICD-10-CM | POA: Diagnosis not present

## 2017-09-11 DIAGNOSIS — M25571 Pain in right ankle and joints of right foot: Secondary | ICD-10-CM | POA: Diagnosis not present

## 2017-09-13 DIAGNOSIS — M25471 Effusion, right ankle: Secondary | ICD-10-CM | POA: Diagnosis not present

## 2017-09-13 DIAGNOSIS — R262 Difficulty in walking, not elsewhere classified: Secondary | ICD-10-CM | POA: Diagnosis not present

## 2017-09-13 DIAGNOSIS — M25371 Other instability, right ankle: Secondary | ICD-10-CM | POA: Diagnosis not present

## 2017-09-13 DIAGNOSIS — M25571 Pain in right ankle and joints of right foot: Secondary | ICD-10-CM | POA: Diagnosis not present

## 2017-09-15 DIAGNOSIS — M25371 Other instability, right ankle: Secondary | ICD-10-CM | POA: Diagnosis not present

## 2017-09-15 DIAGNOSIS — M25571 Pain in right ankle and joints of right foot: Secondary | ICD-10-CM | POA: Diagnosis not present

## 2017-09-15 DIAGNOSIS — R262 Difficulty in walking, not elsewhere classified: Secondary | ICD-10-CM | POA: Diagnosis not present

## 2017-09-15 DIAGNOSIS — M25471 Effusion, right ankle: Secondary | ICD-10-CM | POA: Diagnosis not present

## 2017-09-17 DIAGNOSIS — M25371 Other instability, right ankle: Secondary | ICD-10-CM | POA: Diagnosis not present

## 2017-09-17 DIAGNOSIS — M25571 Pain in right ankle and joints of right foot: Secondary | ICD-10-CM | POA: Diagnosis not present

## 2017-09-17 DIAGNOSIS — R262 Difficulty in walking, not elsewhere classified: Secondary | ICD-10-CM | POA: Diagnosis not present

## 2017-09-17 DIAGNOSIS — M25471 Effusion, right ankle: Secondary | ICD-10-CM | POA: Diagnosis not present

## 2017-09-23 DIAGNOSIS — M25471 Effusion, right ankle: Secondary | ICD-10-CM | POA: Diagnosis not present

## 2017-09-23 DIAGNOSIS — R262 Difficulty in walking, not elsewhere classified: Secondary | ICD-10-CM | POA: Diagnosis not present

## 2017-09-23 DIAGNOSIS — M25371 Other instability, right ankle: Secondary | ICD-10-CM | POA: Diagnosis not present

## 2017-09-23 DIAGNOSIS — M25571 Pain in right ankle and joints of right foot: Secondary | ICD-10-CM | POA: Diagnosis not present

## 2017-09-26 DIAGNOSIS — M25471 Effusion, right ankle: Secondary | ICD-10-CM | POA: Diagnosis not present

## 2017-09-26 DIAGNOSIS — M25571 Pain in right ankle and joints of right foot: Secondary | ICD-10-CM | POA: Diagnosis not present

## 2017-09-26 DIAGNOSIS — M25371 Other instability, right ankle: Secondary | ICD-10-CM | POA: Diagnosis not present

## 2017-09-26 DIAGNOSIS — R262 Difficulty in walking, not elsewhere classified: Secondary | ICD-10-CM | POA: Diagnosis not present

## 2017-09-30 DIAGNOSIS — M25371 Other instability, right ankle: Secondary | ICD-10-CM | POA: Diagnosis not present

## 2017-09-30 DIAGNOSIS — M25571 Pain in right ankle and joints of right foot: Secondary | ICD-10-CM | POA: Diagnosis not present

## 2017-09-30 DIAGNOSIS — M25471 Effusion, right ankle: Secondary | ICD-10-CM | POA: Diagnosis not present

## 2017-09-30 DIAGNOSIS — R262 Difficulty in walking, not elsewhere classified: Secondary | ICD-10-CM | POA: Diagnosis not present

## 2017-10-01 DIAGNOSIS — R262 Difficulty in walking, not elsewhere classified: Secondary | ICD-10-CM | POA: Diagnosis not present

## 2017-10-01 DIAGNOSIS — M25371 Other instability, right ankle: Secondary | ICD-10-CM | POA: Diagnosis not present

## 2017-10-01 DIAGNOSIS — M25571 Pain in right ankle and joints of right foot: Secondary | ICD-10-CM | POA: Diagnosis not present

## 2017-10-01 DIAGNOSIS — M25471 Effusion, right ankle: Secondary | ICD-10-CM | POA: Diagnosis not present

## 2017-10-03 ENCOUNTER — Ambulatory Visit: Payer: BLUE CROSS/BLUE SHIELD | Admitting: Podiatry

## 2017-10-03 ENCOUNTER — Encounter: Payer: Self-pay | Admitting: Podiatry

## 2017-10-03 DIAGNOSIS — M7671 Peroneal tendinitis, right leg: Secondary | ICD-10-CM

## 2017-10-03 DIAGNOSIS — M722 Plantar fascial fibromatosis: Secondary | ICD-10-CM | POA: Diagnosis not present

## 2017-10-03 NOTE — Patient Instructions (Signed)
Pre-Operative Instructions  Congratulations, you have decided to take an important step towards improving your quality of life.  You can be assured that the doctors and staff at [facility] will be with you every step of the way.  Here are some important things you should know:  1. Plan to be at the surgery center/hospital at least 1 (one) hour prior to your scheduled time, unless otherwise directed by the surgical center/hospital staff.  You must have a responsible adult accompany you, remain during the surgery and drive you home.  Make sure you have directions to the surgical center/hospital to ensure you arrive on time. 2. If you are having surgery at [facility] or [facility], you will need a copy of your medical history and physical form from your family physician within one month prior to the date of surgery. We will give you a form for your primary physician to complete.  3. We make every effort to accommodate the date you request for surgery.  However, there are times where surgery dates or times have to be moved.  We will contact you as soon as possible if a change in schedule is required.   4. No aspirin/ibuprofen for one week before surgery.  If you are on aspirin, any non-steroidal anti-inflammatory medications (Mobic, Aleve, Ibuprofen) should not be taken seven (7) days prior to your surgery.  You make take Tylenol for pain prior to surgery.  5. Medications - If you are taking daily heart and blood pressure medications, seizure, reflux, allergy, asthma, anxiety, pain or diabetes medications, make sure you notify the surgery center/hospital before the day of surgery so they can tell you which medications you should take or avoid the day of surgery. 6. No food or drink after midnight the night before surgery unless directed otherwise by surgical center/hospital staff. 7. No alcoholic beverages 24-hours prior to surgery.  No smoking 24-hours prior or 24-hours after  surgery. 8. Wear loose pants or shorts. They should be loose enough to fit over bandages, boots, and casts. 9. Don't wear slip-on shoes. Sneakers are preferred. 10. Bring your boot with you to the surgery center/hospital.  Also bring crutches or a walker if your physician has prescribed it for you.  If you do not have this equipment, it will be provided for you after surgery. 11. If you have not been contacted by the surgery center/hospital by the day before your surgery, call to confirm the date and time of your surgery. 12. Leave-time from work may vary depending on the type of surgery you have.  Appropriate arrangements should be made prior to surgery with your employer. 13. Prescriptions will be provided immediately following surgery by your doctor.  Fill these as soon as possible after surgery and take the medication as directed. Pain medications will not be refilled on weekends and must be approved by the doctor. 14. Remove nail polish on the operative foot and avoid getting pedicures prior to surgery. 15. Wash the night before surgery.  The night before surgery wash the foot and leg well with water and the antibacterial soap provided. Be sure to pay special attention to beneath the toenails and in between the toes.  Wash for at least three (3) minutes. Rinse thoroughly with water and dry well with a towel.  Perform this wash unless told not to do so by your physician.  Enclosed: 1 Ice pack (please put in freezer the night before surgery)   1 Hibiclens skin cleaner     Pre-op instructions  If you have any questions regarding the instructions, please do not hesitate to call our office.  [place]: [address], [place], [place] [address] -- [phone]  [place]: [address] , [place] [address] -- [phone]  [place]: [address]  [place], [place] [address] -- [phone]  [place]: [address], [address], [place], [place] [address] -- [phone]  Website:  [email or web address] 

## 2017-10-03 NOTE — Progress Notes (Signed)
She presents today for follow-up of her heel pain.  And for her MRI report.  Objective: Vital signs are stable alert and oriented x3.  MRI demonstrates continued plantar fasciitis even though we have performed an endoscopic plantar fasciotomy on her before it also indicates peroneal tendinitis and the majority of her pain is on the lateral aspect of the foot..  She is not wearing her orthotics so she will start to do that.  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  Plan: We will explore the peroneal tendons and perform a complete endoscopic fasciotomy she will continue to wear her orthotics afterwards we consented her for this today she understands this and is amenable to it follow-up with me in the near future for surgical intervention.

## 2017-10-24 ENCOUNTER — Encounter: Payer: Self-pay | Admitting: Podiatry

## 2017-10-24 ENCOUNTER — Other Ambulatory Visit: Payer: Self-pay | Admitting: Podiatry

## 2017-10-24 DIAGNOSIS — M722 Plantar fascial fibromatosis: Secondary | ICD-10-CM | POA: Diagnosis not present

## 2017-10-24 DIAGNOSIS — M25571 Pain in right ankle and joints of right foot: Secondary | ICD-10-CM | POA: Diagnosis not present

## 2017-10-24 DIAGNOSIS — T148XXS Other injury of unspecified body region, sequela: Secondary | ICD-10-CM | POA: Diagnosis not present

## 2017-10-24 DIAGNOSIS — Z01818 Encounter for other preprocedural examination: Secondary | ICD-10-CM | POA: Diagnosis not present

## 2017-10-24 DIAGNOSIS — S86311A Strain of muscle(s) and tendon(s) of peroneal muscle group at lower leg level, right leg, initial encounter: Secondary | ICD-10-CM | POA: Diagnosis not present

## 2017-10-24 MED ORDER — PROMETHAZINE HCL 25 MG PO TABS: 25.0000 mg | ORAL_TABLET | Freq: Three times a day (TID) | ORAL | 0 refills | Status: DC | PRN

## 2017-10-24 MED ORDER — MEPERIDINE HCL 50 MG PO TABS: ORAL_TABLET | ORAL | 0 refills | Status: DC

## 2017-10-24 MED ORDER — CLINDAMYCIN HCL 150 MG PO CAPS: 150.0000 mg | ORAL_CAPSULE | Freq: Three times a day (TID) | ORAL | 0 refills | Status: DC

## 2017-10-30 ENCOUNTER — Telehealth: Payer: Self-pay | Admitting: Podiatry

## 2017-10-30 NOTE — Telephone Encounter (Signed)
I had surgery last Thursday 24 October 2017. I was told not to put any weight on my foot and I haven't been. However, I almost fell and landed on my foot just a little bit but I did not put all my weight on my foot. I was told if I fell I needed to let Dr. Milinda Pointer know. I do have an appointment tomorrow and I did check the foot out without unwrapping it. I did not see any blood and it does not hurt worse than before. I just wanted to call and let him know that. My number is [phone]. Thank you.

## 2017-10-30 NOTE — Telephone Encounter (Signed)
I informed pt I would send a message to Dr. Milinda Pointer and that if other instructs came from him I would give another call, I felt she would be fine to wait until tomorrow's appt. Pt states the pain has not changed from post op period, and there was no bleeding.

## 2017-10-30 NOTE — Telephone Encounter (Signed)
I'll see her tomorrow

## 2017-10-31 ENCOUNTER — Encounter: Payer: Self-pay | Admitting: Podiatry

## 2017-10-31 ENCOUNTER — Ambulatory Visit (INDEPENDENT_AMBULATORY_CARE_PROVIDER_SITE_OTHER): Payer: BLUE CROSS/BLUE SHIELD | Admitting: Podiatry

## 2017-10-31 VITALS — Temp 99.7°F

## 2017-10-31 DIAGNOSIS — M722 Plantar fascial fibromatosis: Secondary | ICD-10-CM

## 2017-10-31 DIAGNOSIS — M7671 Peroneal tendinitis, right leg: Secondary | ICD-10-CM

## 2017-11-02 NOTE — Progress Notes (Signed)
She presents today for her first postop visit date of surgery 10/24/2017 with a complete endoscopic plantar fasciotomy and peroneal tendon repair.  She states that the foot is hurting but I am only taking Motrin at this time.  She states that Demerol really was not doing much.  Objective: Vital signs are stable she is alert and oriented x3 does not appear to be in any distress.  Dry sterile dressing was removed demonstrates no erythema edema cellulitis drainage or odor staples are intact margins are well coapted she has good range of motion of the foot mild tenderness.  Assessment: Well-healing surgical foot.  Plan: Redressed today dressed a compressive dressing put her back in her Cam walker she is to stay in this 24/7 keep this clean and dry and I will follow-up with her in 1 week.

## 2017-11-07 ENCOUNTER — Ambulatory Visit (INDEPENDENT_AMBULATORY_CARE_PROVIDER_SITE_OTHER): Payer: BLUE CROSS/BLUE SHIELD | Admitting: Podiatry

## 2017-11-07 ENCOUNTER — Encounter: Payer: Self-pay | Admitting: Podiatry

## 2017-11-07 DIAGNOSIS — M7671 Peroneal tendinitis, right leg: Secondary | ICD-10-CM

## 2017-11-08 ENCOUNTER — Telehealth: Payer: Self-pay | Admitting: Podiatry

## 2017-11-08 NOTE — Telephone Encounter (Signed)
I'm running out of pants that fit over the top of my boot. I wanted to know if it is okay for me to take my boot off long enough to pull a pair of pants up and put the boot back on very carefull? You can call me back at [phone]. Thanks.

## 2017-11-08 NOTE — Telephone Encounter (Signed)
I told pt to leave the boot on, take a seat, then push pants down to the top of the boot, remove boot take off pants and put on new pair then put on boot, then may be easier to put the rest of the pants on, the idea is to keep the surgery foot in position and protected as much as possible. Pt states understanding.

## 2017-11-09 NOTE — Progress Notes (Signed)
She presents today for follow-up of her peroneal tendon and endoscopic plantar fasciotomy of the right foot.  Objective: Vital signs are stable she is alert and oriented x3.  Dry sterile dressing intact was removed demonstrates staples are intact sutures are intact margins are well coapted mild erythema overlying the staples mild tenderness on range of motion.  Assessment: Well-healing surgical foot.  Plan: At this point I encouraged her to keep the new dressing dry and clean keep foot elevated keep it in the boot continue nonweightbearing status follow-up with me 1 week for suture and staple removal.

## 2017-11-12 DIAGNOSIS — J0101 Acute recurrent maxillary sinusitis: Secondary | ICD-10-CM | POA: Diagnosis not present

## 2017-11-14 ENCOUNTER — Encounter: Payer: Self-pay | Admitting: Podiatry

## 2017-11-14 ENCOUNTER — Ambulatory Visit: Payer: Self-pay | Admitting: Podiatry

## 2017-11-14 DIAGNOSIS — M79676 Pain in unspecified toe(s): Secondary | ICD-10-CM

## 2017-11-14 DIAGNOSIS — M7671 Peroneal tendinitis, right leg: Secondary | ICD-10-CM

## 2017-11-14 DIAGNOSIS — M722 Plantar fascial fibromatosis: Secondary | ICD-10-CM

## 2017-11-14 NOTE — Progress Notes (Signed)
She presents today status post endoscopic plantar fasciotomy and peroneal tendon repair date of surgery 10/24/2017.  She states that her foot still hurts at times but seems to be doing much better than it was.  She denies fever chills nausea vomiting muscle aches pains shortness of breath and chest pain.  Objective: Vital signs are stable she is alert and oriented x3 once removed dressing the skin demonstrates well-healed margins and staples are intact margins were still intact once the staples were removed today there is no erythema no cellulitis drainage or odor.  She has good range of motion of the foot nontender on palpation of the plantar fascia.  Assessment: Well-healing surgical foot right.  Plan: Placed in a compression anklet and a dry sterile dressing placed Steri-Strips I will her to start getting this wet tomorrow but she is to remain nonweightbearing in her Cam walker.

## 2017-11-26 ENCOUNTER — Encounter: Payer: Self-pay | Admitting: Podiatry

## 2017-11-26 ENCOUNTER — Ambulatory Visit (INDEPENDENT_AMBULATORY_CARE_PROVIDER_SITE_OTHER): Payer: BLUE CROSS/BLUE SHIELD | Admitting: Podiatry

## 2017-11-26 DIAGNOSIS — M7671 Peroneal tendinitis, right leg: Secondary | ICD-10-CM

## 2017-11-26 NOTE — Progress Notes (Signed)
She presents today states that it feels a little tight and it feels like a pulling sensation as she refers to her endoscopic plantar fasciotomy and her peroneal tendon repair on the right foot.  This was performed early December 2018.  Objective: Vital signs are stable she is alert and oriented x3 presents today in her Cam walker and her knee scooter nonweightbearing fashion.  There is no erythema cellulitis drainage or odor Steri-Strips were removed margins appear to be well coapted and the plantar heel has no pain on palpation.  She has good inversion against resistance and eversion against resistance with good dorsiflexion and plantar flexion and digital motion.  No radiating pain is noted.  Assessment: Well-healing surgical foot right.  Plan: I am going to suggest that over the next few days she start walking with the Cam walker and then transition to a Tri-Lock brace which we dispensed for her today.  On follow-up with her in 3 weeks

## 2017-12-12 ENCOUNTER — Encounter: Payer: Self-pay | Admitting: Podiatry

## 2017-12-12 ENCOUNTER — Ambulatory Visit (INDEPENDENT_AMBULATORY_CARE_PROVIDER_SITE_OTHER): Payer: BLUE CROSS/BLUE SHIELD | Admitting: Podiatry

## 2017-12-12 DIAGNOSIS — M7671 Peroneal tendinitis, right leg: Secondary | ICD-10-CM

## 2017-12-12 DIAGNOSIS — M722 Plantar fascial fibromatosis: Secondary | ICD-10-CM

## 2017-12-14 NOTE — Progress Notes (Signed)
She presents today for follow-up of a peroneal tendon repair and an endoscopic plantar fasciotomy right foot.  Date of surgery 10/24/2017.  States that is sore along the incision but it feels superficial.  Objective: She presents today ambulating with an antalgic gait to the right lower extremity.  Vital signs are stable she is alert and oriented x3 she has strong palpable pulses right lower extremity with minimal edema.  Mild tenderness on palpation of the skin superficially as well as deep.  He does have strong abduction against resistance and plantar flexion and eversion.  Mild tenderness on palpation of the EPF site.  Assessment: Still suffering from some scar tissue and entrapment of the sural distribution.  Plan: I offered her physical therapy she declined.  We discussed pros and cons of the use of stretching and massage therapy and increase in activity also discussed contrast baths with her today.  Encouraged anti-inflammatories.  Follow-up with her in about 6 weeks

## 2017-12-26 ENCOUNTER — Encounter: Payer: Self-pay | Admitting: Podiatry

## 2017-12-26 ENCOUNTER — Ambulatory Visit (INDEPENDENT_AMBULATORY_CARE_PROVIDER_SITE_OTHER): Payer: BLUE CROSS/BLUE SHIELD | Admitting: Podiatry

## 2017-12-26 DIAGNOSIS — M722 Plantar fascial fibromatosis: Secondary | ICD-10-CM

## 2017-12-26 DIAGNOSIS — M7671 Peroneal tendinitis, right leg: Secondary | ICD-10-CM

## 2017-12-26 NOTE — Progress Notes (Signed)
She presents today for follow-up of her EPF and peroneal tendon repair right foot.  States that she is doing much better but the last few days she had had to take a Demerol because it was so painful.  She states that last night was the worst since surgery.  Objective: Vital signs are stable she is alert and oriented x3 she denies any further trauma to the foot.  She has pain on palpation to one area in particular where there is still a small eschar that is present this is fibrosed down within the lateral incision along the peroneal tendons I am and I do believe this is the etiology of majority of her symptomatology.  Assessment: Well-healing surgical foot and other than this 1 small area she does not have a lot of pain associated with it.  Plan: I am going to finally convince her to try physical therapy in [place].  I will follow-up with her in 3-4 weeks.  Until then she is to remain out of work should there is no way that she can work at this point.

## 2017-12-27 ENCOUNTER — Telehealth: Payer: Self-pay | Admitting: *Deleted

## 2017-12-27 DIAGNOSIS — Z9889 Other specified postprocedural states: Secondary | ICD-10-CM

## 2017-12-27 NOTE — Telephone Encounter (Signed)
-----   Message from Rip Harbour, [facility] sent at 12/26/2017 11:47 AM EST ----- Regarding: PT referral  [facility] [address], [place]) [place], [place], [place] [address] [phone]  S/p peroneal tendon repair and EPF right - DOS 10-24-17 -decrease pain -increase ROM -eval and treat  Duration-3 x wk x 4wks

## 2017-12-27 NOTE — Telephone Encounter (Signed)
Orders faxed to [facility].

## 2017-12-30 DIAGNOSIS — Z85820 Personal history of malignant melanoma of skin: Secondary | ICD-10-CM | POA: Diagnosis not present

## 2017-12-30 DIAGNOSIS — Z08 Encounter for follow-up examination after completed treatment for malignant neoplasm: Secondary | ICD-10-CM | POA: Diagnosis not present

## 2017-12-30 DIAGNOSIS — D225 Melanocytic nevi of trunk: Secondary | ICD-10-CM | POA: Diagnosis not present

## 2017-12-30 DIAGNOSIS — D2261 Melanocytic nevi of right upper limb, including shoulder: Secondary | ICD-10-CM | POA: Diagnosis not present

## 2017-12-30 DIAGNOSIS — Z1283 Encounter for screening for malignant neoplasm of skin: Secondary | ICD-10-CM | POA: Diagnosis not present

## 2017-12-30 DIAGNOSIS — D485 Neoplasm of uncertain behavior of skin: Secondary | ICD-10-CM | POA: Diagnosis not present

## 2018-01-01 DIAGNOSIS — M25571 Pain in right ankle and joints of right foot: Secondary | ICD-10-CM | POA: Diagnosis not present

## 2018-01-01 DIAGNOSIS — M6281 Muscle weakness (generalized): Secondary | ICD-10-CM | POA: Diagnosis not present

## 2018-01-01 DIAGNOSIS — M25471 Effusion, right ankle: Secondary | ICD-10-CM | POA: Diagnosis not present

## 2018-01-01 DIAGNOSIS — M25671 Stiffness of right ankle, not elsewhere classified: Secondary | ICD-10-CM | POA: Diagnosis not present

## 2018-01-03 DIAGNOSIS — M25571 Pain in right ankle and joints of right foot: Secondary | ICD-10-CM | POA: Diagnosis not present

## 2018-01-03 DIAGNOSIS — M6281 Muscle weakness (generalized): Secondary | ICD-10-CM | POA: Diagnosis not present

## 2018-01-03 DIAGNOSIS — M25671 Stiffness of right ankle, not elsewhere classified: Secondary | ICD-10-CM | POA: Diagnosis not present

## 2018-01-03 DIAGNOSIS — M25471 Effusion, right ankle: Secondary | ICD-10-CM | POA: Diagnosis not present

## 2018-01-06 DIAGNOSIS — M25571 Pain in right ankle and joints of right foot: Secondary | ICD-10-CM | POA: Diagnosis not present

## 2018-01-06 DIAGNOSIS — M6281 Muscle weakness (generalized): Secondary | ICD-10-CM | POA: Diagnosis not present

## 2018-01-06 DIAGNOSIS — M25471 Effusion, right ankle: Secondary | ICD-10-CM | POA: Diagnosis not present

## 2018-01-06 DIAGNOSIS — M25671 Stiffness of right ankle, not elsewhere classified: Secondary | ICD-10-CM | POA: Diagnosis not present

## 2018-01-09 DIAGNOSIS — L98499 Non-pressure chronic ulcer of skin of other sites with unspecified severity: Secondary | ICD-10-CM | POA: Diagnosis not present

## 2018-01-09 DIAGNOSIS — D485 Neoplasm of uncertain behavior of skin: Secondary | ICD-10-CM | POA: Diagnosis not present

## 2018-01-10 DIAGNOSIS — M25571 Pain in right ankle and joints of right foot: Secondary | ICD-10-CM | POA: Diagnosis not present

## 2018-01-10 DIAGNOSIS — M6281 Muscle weakness (generalized): Secondary | ICD-10-CM | POA: Diagnosis not present

## 2018-01-10 DIAGNOSIS — M25671 Stiffness of right ankle, not elsewhere classified: Secondary | ICD-10-CM | POA: Diagnosis not present

## 2018-01-10 DIAGNOSIS — M25471 Effusion, right ankle: Secondary | ICD-10-CM | POA: Diagnosis not present

## 2018-01-13 DIAGNOSIS — M25571 Pain in right ankle and joints of right foot: Secondary | ICD-10-CM | POA: Diagnosis not present

## 2018-01-13 DIAGNOSIS — M6281 Muscle weakness (generalized): Secondary | ICD-10-CM | POA: Diagnosis not present

## 2018-01-13 DIAGNOSIS — M25471 Effusion, right ankle: Secondary | ICD-10-CM | POA: Diagnosis not present

## 2018-01-13 DIAGNOSIS — M25671 Stiffness of right ankle, not elsewhere classified: Secondary | ICD-10-CM | POA: Diagnosis not present

## 2018-01-15 DIAGNOSIS — M25471 Effusion, right ankle: Secondary | ICD-10-CM | POA: Diagnosis not present

## 2018-01-15 DIAGNOSIS — M25571 Pain in right ankle and joints of right foot: Secondary | ICD-10-CM | POA: Diagnosis not present

## 2018-01-15 DIAGNOSIS — M6281 Muscle weakness (generalized): Secondary | ICD-10-CM | POA: Diagnosis not present

## 2018-01-15 DIAGNOSIS — M25671 Stiffness of right ankle, not elsewhere classified: Secondary | ICD-10-CM | POA: Diagnosis not present

## 2018-01-16 ENCOUNTER — Encounter: Payer: Self-pay | Admitting: Podiatry

## 2018-01-16 ENCOUNTER — Ambulatory Visit (INDEPENDENT_AMBULATORY_CARE_PROVIDER_SITE_OTHER): Payer: BLUE CROSS/BLUE SHIELD | Admitting: Podiatry

## 2018-01-16 DIAGNOSIS — M722 Plantar fascial fibromatosis: Secondary | ICD-10-CM

## 2018-01-16 DIAGNOSIS — M7671 Peroneal tendinitis, right leg: Secondary | ICD-10-CM

## 2018-01-16 NOTE — Progress Notes (Signed)
She presents today for follow-up of her EPF and repair of her peroneal tendon.  She states that she still in PT and is still moderately tender.  Objective: Vital signs are stable alert and oriented x3.  Pulses are palpable.  She still has tenderness on palpation and range of motion of the peroneal tendons of the right foot and ankle.  Assessment: Slowly resolving pain to the right lower extremity though not completely healed.  Plan: I am going to increase her time out of work for at least another month and continue physical therapy.  I will follow-up with her in 1-2 weeks.

## 2018-01-20 ENCOUNTER — Telehealth: Payer: Self-pay | Admitting: *Deleted

## 2018-01-20 NOTE — Telephone Encounter (Signed)
Left message for progress note of 01/15/2018 to [phone] and I would have Dr. Milinda Pointer review.

## 2018-01-20 NOTE — Telephone Encounter (Signed)
Staff member [facility] called for progress note of 01/15/2018, questioned if to continue with plan of care, please fax to [phone].

## 2018-01-22 DIAGNOSIS — M25671 Stiffness of right ankle, not elsewhere classified: Secondary | ICD-10-CM | POA: Diagnosis not present

## 2018-01-22 DIAGNOSIS — M25571 Pain in right ankle and joints of right foot: Secondary | ICD-10-CM | POA: Diagnosis not present

## 2018-01-22 DIAGNOSIS — M6281 Muscle weakness (generalized): Secondary | ICD-10-CM | POA: Diagnosis not present

## 2018-01-22 DIAGNOSIS — M25471 Effusion, right ankle: Secondary | ICD-10-CM | POA: Diagnosis not present

## 2018-01-23 ENCOUNTER — Encounter: Payer: BLUE CROSS/BLUE SHIELD | Admitting: Podiatry

## 2018-01-24 DIAGNOSIS — M25571 Pain in right ankle and joints of right foot: Secondary | ICD-10-CM | POA: Diagnosis not present

## 2018-01-24 DIAGNOSIS — M6281 Muscle weakness (generalized): Secondary | ICD-10-CM | POA: Diagnosis not present

## 2018-01-24 DIAGNOSIS — M25671 Stiffness of right ankle, not elsewhere classified: Secondary | ICD-10-CM | POA: Diagnosis not present

## 2018-01-24 DIAGNOSIS — M25471 Effusion, right ankle: Secondary | ICD-10-CM | POA: Diagnosis not present

## 2018-01-28 DIAGNOSIS — M25671 Stiffness of right ankle, not elsewhere classified: Secondary | ICD-10-CM | POA: Diagnosis not present

## 2018-01-28 DIAGNOSIS — M25471 Effusion, right ankle: Secondary | ICD-10-CM | POA: Diagnosis not present

## 2018-01-28 DIAGNOSIS — M6281 Muscle weakness (generalized): Secondary | ICD-10-CM | POA: Diagnosis not present

## 2018-01-28 DIAGNOSIS — M25571 Pain in right ankle and joints of right foot: Secondary | ICD-10-CM | POA: Diagnosis not present

## 2018-01-30 DIAGNOSIS — M25671 Stiffness of right ankle, not elsewhere classified: Secondary | ICD-10-CM | POA: Diagnosis not present

## 2018-01-30 DIAGNOSIS — M25471 Effusion, right ankle: Secondary | ICD-10-CM | POA: Diagnosis not present

## 2018-01-30 DIAGNOSIS — M25571 Pain in right ankle and joints of right foot: Secondary | ICD-10-CM | POA: Diagnosis not present

## 2018-01-30 DIAGNOSIS — M6281 Muscle weakness (generalized): Secondary | ICD-10-CM | POA: Diagnosis not present

## 2018-02-03 DIAGNOSIS — M25471 Effusion, right ankle: Secondary | ICD-10-CM | POA: Diagnosis not present

## 2018-02-03 DIAGNOSIS — M25671 Stiffness of right ankle, not elsewhere classified: Secondary | ICD-10-CM | POA: Diagnosis not present

## 2018-02-03 DIAGNOSIS — M6281 Muscle weakness (generalized): Secondary | ICD-10-CM | POA: Diagnosis not present

## 2018-02-03 DIAGNOSIS — M25571 Pain in right ankle and joints of right foot: Secondary | ICD-10-CM | POA: Diagnosis not present

## 2018-02-05 DIAGNOSIS — M6281 Muscle weakness (generalized): Secondary | ICD-10-CM | POA: Diagnosis not present

## 2018-02-05 DIAGNOSIS — M25671 Stiffness of right ankle, not elsewhere classified: Secondary | ICD-10-CM | POA: Diagnosis not present

## 2018-02-05 DIAGNOSIS — M25571 Pain in right ankle and joints of right foot: Secondary | ICD-10-CM | POA: Diagnosis not present

## 2018-02-05 DIAGNOSIS — M25471 Effusion, right ankle: Secondary | ICD-10-CM | POA: Diagnosis not present

## 2018-02-13 ENCOUNTER — Encounter: Payer: Self-pay | Admitting: Podiatry

## 2018-02-13 ENCOUNTER — Ambulatory Visit: Payer: BLUE CROSS/BLUE SHIELD | Admitting: Podiatry

## 2018-02-13 DIAGNOSIS — M7671 Peroneal tendinitis, right leg: Secondary | ICD-10-CM | POA: Diagnosis not present

## 2018-02-13 DIAGNOSIS — M722 Plantar fascial fibromatosis: Secondary | ICD-10-CM | POA: Diagnosis not present

## 2018-02-13 NOTE — Progress Notes (Signed)
She presents today date of surgery 10/24/2017 with a complete endoscopic fasciotomy and repair of the peroneal tendon right foot.  States that that she still can get a full range of motion of the ankle joint but states that is feeling better than it was.  She is still continuing with physical therapy in hopes to relieve some of the numbness and tingling in the lateral aspect of the foot and to gain more stability.  Objective: Vital signs are stable she is alert and oriented x3.  There is no erythema edema cellulitis drainage or odor.  Pulses are palpable.  She is good range of motion of the ankle joint mildly tender on palpation of the lateral scar itself she does have tenderness on palpation of the sural nerve.  Assessment: Slowly resolving endoscopic fasciotomy and peroneal tendon repair.  Plan: Continue physical therapy continue out of work until further notice.  Follow-up with her hopefully in 3-4 weeks.  Hopefully at that time will be able to get back to work light duty

## 2018-02-18 DIAGNOSIS — M25471 Effusion, right ankle: Secondary | ICD-10-CM | POA: Diagnosis not present

## 2018-02-18 DIAGNOSIS — M25671 Stiffness of right ankle, not elsewhere classified: Secondary | ICD-10-CM | POA: Diagnosis not present

## 2018-02-18 DIAGNOSIS — M25571 Pain in right ankle and joints of right foot: Secondary | ICD-10-CM | POA: Diagnosis not present

## 2018-02-18 DIAGNOSIS — M6281 Muscle weakness (generalized): Secondary | ICD-10-CM | POA: Diagnosis not present

## 2018-02-19 ENCOUNTER — Telehealth: Payer: Self-pay | Admitting: *Deleted

## 2018-02-19 NOTE — Telephone Encounter (Signed)
Pt states she was transferred to my phone, but had wanted to speak with Marcie Bal concerning paperwork to be sent to [employer], and [employer] concerning her leave of absence extension, both companies needed extension papers completed and current clinicals.

## 2018-02-20 DIAGNOSIS — M25571 Pain in right ankle and joints of right foot: Secondary | ICD-10-CM | POA: Diagnosis not present

## 2018-02-20 DIAGNOSIS — M25671 Stiffness of right ankle, not elsewhere classified: Secondary | ICD-10-CM | POA: Diagnosis not present

## 2018-02-20 DIAGNOSIS — M25471 Effusion, right ankle: Secondary | ICD-10-CM | POA: Diagnosis not present

## 2018-02-20 DIAGNOSIS — M6281 Muscle weakness (generalized): Secondary | ICD-10-CM | POA: Diagnosis not present

## 2018-02-26 DIAGNOSIS — M25571 Pain in right ankle and joints of right foot: Secondary | ICD-10-CM | POA: Diagnosis not present

## 2018-02-26 DIAGNOSIS — M25671 Stiffness of right ankle, not elsewhere classified: Secondary | ICD-10-CM | POA: Diagnosis not present

## 2018-02-26 DIAGNOSIS — M6281 Muscle weakness (generalized): Secondary | ICD-10-CM | POA: Diagnosis not present

## 2018-02-26 DIAGNOSIS — M25471 Effusion, right ankle: Secondary | ICD-10-CM | POA: Diagnosis not present

## 2018-02-27 DIAGNOSIS — M25671 Stiffness of right ankle, not elsewhere classified: Secondary | ICD-10-CM | POA: Diagnosis not present

## 2018-02-27 DIAGNOSIS — M25471 Effusion, right ankle: Secondary | ICD-10-CM | POA: Diagnosis not present

## 2018-02-27 DIAGNOSIS — M25571 Pain in right ankle and joints of right foot: Secondary | ICD-10-CM | POA: Diagnosis not present

## 2018-02-27 DIAGNOSIS — M6281 Muscle weakness (generalized): Secondary | ICD-10-CM | POA: Diagnosis not present

## 2018-03-04 DIAGNOSIS — M25571 Pain in right ankle and joints of right foot: Secondary | ICD-10-CM | POA: Diagnosis not present

## 2018-03-04 DIAGNOSIS — M25671 Stiffness of right ankle, not elsewhere classified: Secondary | ICD-10-CM | POA: Diagnosis not present

## 2018-03-04 DIAGNOSIS — M25471 Effusion, right ankle: Secondary | ICD-10-CM | POA: Diagnosis not present

## 2018-03-04 DIAGNOSIS — M6281 Muscle weakness (generalized): Secondary | ICD-10-CM | POA: Diagnosis not present

## 2018-03-06 DIAGNOSIS — M25671 Stiffness of right ankle, not elsewhere classified: Secondary | ICD-10-CM | POA: Diagnosis not present

## 2018-03-06 DIAGNOSIS — M25571 Pain in right ankle and joints of right foot: Secondary | ICD-10-CM | POA: Diagnosis not present

## 2018-03-06 DIAGNOSIS — M25471 Effusion, right ankle: Secondary | ICD-10-CM | POA: Diagnosis not present

## 2018-03-06 DIAGNOSIS — M6281 Muscle weakness (generalized): Secondary | ICD-10-CM | POA: Diagnosis not present

## 2018-03-10 DIAGNOSIS — M25571 Pain in right ankle and joints of right foot: Secondary | ICD-10-CM | POA: Diagnosis not present

## 2018-03-10 DIAGNOSIS — M25671 Stiffness of right ankle, not elsewhere classified: Secondary | ICD-10-CM | POA: Diagnosis not present

## 2018-03-10 DIAGNOSIS — M6281 Muscle weakness (generalized): Secondary | ICD-10-CM | POA: Diagnosis not present

## 2018-03-10 DIAGNOSIS — M25471 Effusion, right ankle: Secondary | ICD-10-CM | POA: Diagnosis not present

## 2018-03-12 DIAGNOSIS — M25571 Pain in right ankle and joints of right foot: Secondary | ICD-10-CM | POA: Diagnosis not present

## 2018-03-12 DIAGNOSIS — M25671 Stiffness of right ankle, not elsewhere classified: Secondary | ICD-10-CM | POA: Diagnosis not present

## 2018-03-12 DIAGNOSIS — M25471 Effusion, right ankle: Secondary | ICD-10-CM | POA: Diagnosis not present

## 2018-03-12 DIAGNOSIS — M6281 Muscle weakness (generalized): Secondary | ICD-10-CM | POA: Diagnosis not present

## 2018-03-17 DIAGNOSIS — M25671 Stiffness of right ankle, not elsewhere classified: Secondary | ICD-10-CM | POA: Diagnosis not present

## 2018-03-17 DIAGNOSIS — M6281 Muscle weakness (generalized): Secondary | ICD-10-CM | POA: Diagnosis not present

## 2018-03-17 DIAGNOSIS — M25471 Effusion, right ankle: Secondary | ICD-10-CM | POA: Diagnosis not present

## 2018-03-17 DIAGNOSIS — M25571 Pain in right ankle and joints of right foot: Secondary | ICD-10-CM | POA: Diagnosis not present

## 2018-03-18 ENCOUNTER — Encounter: Payer: Self-pay | Admitting: Podiatry

## 2018-03-18 ENCOUNTER — Ambulatory Visit: Payer: BLUE CROSS/BLUE SHIELD | Admitting: Podiatry

## 2018-03-18 DIAGNOSIS — M722 Plantar fascial fibromatosis: Secondary | ICD-10-CM | POA: Diagnosis not present

## 2018-03-18 DIAGNOSIS — M7671 Peroneal tendinitis, right leg: Secondary | ICD-10-CM | POA: Diagnosis not present

## 2018-03-19 NOTE — Progress Notes (Signed)
She presents today for follow-up of her peroneal tendon repair and EPF left.  States that is still tender and throbbing.  She states it seems like it is improving and she continues her physical therapy but it is still quite painful.  Still states that it is better than it was before.  Objective: Vital signs are stable she is alert and oriented x3.  Date of surgery 10/24/2017 much decrease in edema around the lateral aspect of the foot she has good full range of motion and strength on abduction against resistance.  She is has simply tenderness around the incision site which is more than likely associated with a neuritis oriented entrapment of the sural nerve.  She has considerable scar tissue in the area as well.  Assessment: Heart tissue inhibiting complete pain relief status post repair of peroneal tendons left.  Plan: Continue physical therapy.  Follow-up with her in about 6 weeks

## 2018-04-08 ENCOUNTER — Telehealth: Payer: Self-pay | Admitting: Podiatry

## 2018-04-08 ENCOUNTER — Encounter: Payer: Self-pay | Admitting: Podiatry

## 2018-04-08 ENCOUNTER — Ambulatory Visit (INDEPENDENT_AMBULATORY_CARE_PROVIDER_SITE_OTHER): Payer: BLUE CROSS/BLUE SHIELD | Admitting: Podiatry

## 2018-04-08 DIAGNOSIS — M7671 Peroneal tendinitis, right leg: Secondary | ICD-10-CM

## 2018-04-08 DIAGNOSIS — M722 Plantar fascial fibromatosis: Secondary | ICD-10-CM

## 2018-04-08 NOTE — Telephone Encounter (Signed)
Left message informed pt that it was not unusual for this to occur, it is just how the body absorbs the medication and the areas of numbness may flucuate, this may last 4-6 hours and maybe even up to 24 hours, to call if more questions.

## 2018-04-08 NOTE — Progress Notes (Signed)
She presents today for follow-up of her endoscopic plantar fasciotomy and repair of her peroneal tendon right foot.  He states that it feels much better than it did before surgery but I discussed this constant throbbing physical therapy was not going very well and he was not helping me very much other than TNT my wallet.  Objective: Vital signs are stable alert and oriented x3.  Pulses are palpable.  She still has tenderness on palpation of the peroneal tendon right throbbing starts at the superior margin of the incision site.  States that the pain is relatively sharp and radiating.  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  Plan: Injected the area today with dexamethasone local anesthetic point of maximal tenderness to help alleviate her symptoms.  Follow-up with her in a month

## 2018-04-08 NOTE — Telephone Encounter (Signed)
I just left from seeing Dr. Milinda Pointer and he gave me an injection. He told me to let him know if my foot felt numb. My foot is numb on the bottom but its not really numb at the incision site or the top of my foot. If you need to call me back, my number is [phone]. Thank you.

## 2018-05-14 DIAGNOSIS — Z713 Dietary counseling and surveillance: Secondary | ICD-10-CM | POA: Diagnosis not present

## 2018-05-15 ENCOUNTER — Encounter: Payer: Self-pay | Admitting: Podiatry

## 2018-05-15 ENCOUNTER — Ambulatory Visit: Payer: BLUE CROSS/BLUE SHIELD | Admitting: Podiatry

## 2018-05-15 DIAGNOSIS — G5781 Other specified mononeuropathies of right lower limb: Secondary | ICD-10-CM

## 2018-05-15 DIAGNOSIS — G5782 Other specified mononeuropathies of left lower limb: Secondary | ICD-10-CM

## 2018-05-17 NOTE — Progress Notes (Signed)
She presents today states that the pain is still radiating up the lateral aspect of her right leg from the old incision site.  Objective: Vital signs are stable she is alert and oriented x3 she has entrapment of the sural nerve at a higher level than normal.  Assessment: Sural nerve entrapment with neuritis.  Plan: Today we initiated dehydrated alcohol injection point of maximal tenderness.  Follow-up with her in 3 weeks for another injection.

## 2018-05-29 ENCOUNTER — Ambulatory Visit: Payer: BLUE CROSS/BLUE SHIELD | Admitting: Podiatry

## 2018-06-05 ENCOUNTER — Encounter: Payer: Self-pay | Admitting: Podiatry

## 2018-06-05 ENCOUNTER — Ambulatory Visit (INDEPENDENT_AMBULATORY_CARE_PROVIDER_SITE_OTHER): Payer: BLUE CROSS/BLUE SHIELD | Admitting: Podiatry

## 2018-06-05 DIAGNOSIS — G5782 Other specified mononeuropathies of left lower limb: Secondary | ICD-10-CM | POA: Diagnosis not present

## 2018-06-05 NOTE — Progress Notes (Signed)
She presents today for follow-up of pain to the lateral aspect of her right foot surgical foot.  She states that she still has some pain along the sural nerve but she has noticed some normal sensation reoccurring to her little toe.  Objective: Vital signs are stable she is alert and oriented x3 she still has tenderness along the sural nerve just below the lateral malleolus.  Her graph assessment: Sural nerve neuritis.  Plan: Injected dehydrated alcohol to the area 2 cc of a 4% dehydrated alcohol was injected to the area after sterile Betadine skin prep I will follow-up with her in 3 weeks.

## 2018-06-26 ENCOUNTER — Encounter: Payer: Self-pay | Admitting: Podiatry

## 2018-06-26 ENCOUNTER — Ambulatory Visit: Payer: BLUE CROSS/BLUE SHIELD | Admitting: Podiatry

## 2018-06-26 DIAGNOSIS — G5782 Other specified mononeuropathies of left lower limb: Secondary | ICD-10-CM

## 2018-06-26 NOTE — Progress Notes (Signed)
She presents today for follow-up of her neuritis.  States that the last injection really did not do too much she would like to consider at least another injection.  She states is starting to bother me right up.  She says this seems to be that point of maximal tenderness.  Objective: Vital signs are stable she is alert and oriented x3 a lot of sensitivity to the skin is now lost.  There is no longer any allodynic type pain overlying the lateral aspect of the foot she states the majority of the pain is a throbbing sensation and then radiating pain up the posterior aspect of the fibula.  Assessment: Neuritis sural nerve status post peroneal tendon repair.  Plan: At this point I injected 2 cc of 4% dehydrated alcohol to the point of maximal tenderness just above the fibula.  Tolerated procedure well without complications.  Follow-up with her in 3 to 4 weeks.

## 2018-07-14 DIAGNOSIS — Z1231 Encounter for screening mammogram for malignant neoplasm of breast: Secondary | ICD-10-CM | POA: Diagnosis not present

## 2018-07-17 ENCOUNTER — Ambulatory Visit: Payer: BLUE CROSS/BLUE SHIELD | Admitting: Podiatry

## 2018-07-21 DIAGNOSIS — N6011 Diffuse cystic mastopathy of right breast: Secondary | ICD-10-CM | POA: Diagnosis not present

## 2018-07-21 DIAGNOSIS — R922 Inconclusive mammogram: Secondary | ICD-10-CM | POA: Diagnosis not present

## 2018-07-23 ENCOUNTER — Telehealth: Payer: Self-pay | Admitting: *Deleted

## 2018-07-23 NOTE — Telephone Encounter (Signed)
ok 

## 2018-07-23 NOTE — Telephone Encounter (Signed)
[place] states pt's [employer] is requiring EMSI Tens Unit Results statement signed by doctor, the TENS Unit was ordered by the PT group Dr. Milinda Pointer referred pt, for right foot and ankle pain.

## 2018-07-31 ENCOUNTER — Encounter: Payer: Self-pay | Admitting: Podiatry

## 2018-07-31 ENCOUNTER — Ambulatory Visit: Payer: BLUE CROSS/BLUE SHIELD | Admitting: Podiatry

## 2018-07-31 DIAGNOSIS — G5782 Other specified mononeuropathies of left lower limb: Secondary | ICD-10-CM

## 2018-07-31 NOTE — Progress Notes (Signed)
She presents today for follow-up of neuritis to the lateral aspect of the right foot that is post peroneal tendon repair.  She states that she still has the throbbing in her lateral ankle.  At this point she states that really does not feel much improved.  Objective: Vital signs are stable she is alert and oriented x3.  Much decrease in edema to the lateral aspect of the right ankle the pain is not nearly as spread out as it has been in the past now it is localized in one area along the tendon itself.  Assessment: Neuritis.  Plan: Reinjected the area today dehydrated alcohol 2 cc was utilized 4% dehydrated alcohol after sterile Betadine skin prep.  Follow-up with her in a month

## 2018-08-19 DIAGNOSIS — Z6832 Body mass index (BMI) 32.0-32.9, adult: Secondary | ICD-10-CM | POA: Diagnosis not present

## 2018-08-19 DIAGNOSIS — M25552 Pain in left hip: Secondary | ICD-10-CM | POA: Diagnosis not present

## 2018-08-19 DIAGNOSIS — M7062 Trochanteric bursitis, left hip: Secondary | ICD-10-CM | POA: Diagnosis not present

## 2018-08-20 DIAGNOSIS — Z23 Encounter for immunization: Secondary | ICD-10-CM | POA: Diagnosis not present

## 2018-08-20 DIAGNOSIS — Z713 Dietary counseling and surveillance: Secondary | ICD-10-CM | POA: Diagnosis not present

## 2018-08-28 ENCOUNTER — Ambulatory Visit: Payer: BLUE CROSS/BLUE SHIELD | Admitting: Podiatry

## 2018-08-28 ENCOUNTER — Encounter: Payer: Self-pay | Admitting: Podiatry

## 2018-08-28 DIAGNOSIS — M7671 Peroneal tendinitis, right leg: Secondary | ICD-10-CM | POA: Diagnosis not present

## 2018-08-28 NOTE — Progress Notes (Signed)
She presents today for follow-up of her pain to her surgical ankle peroneal tendon repair.  She states the shots really does not seem to be working I am still having throbbing in the lateral aspect of my ankle.  Objective: Vital signs are stable alert and oriented x3.  Just pain on palpation of this area.  Assessment: Cannot rule out a peroneal tendon tear or some type of injury after a peroneal tendon repair previously.  Plan: Requesting MRI for reevaluation and possible surgical intervention.

## 2018-08-29 ENCOUNTER — Telehealth: Payer: Self-pay | Admitting: *Deleted

## 2018-08-29 DIAGNOSIS — Z9889 Other specified postprocedural states: Secondary | ICD-10-CM

## 2018-08-29 DIAGNOSIS — G5782 Other specified mononeuropathies of left lower limb: Secondary | ICD-10-CM

## 2018-08-29 DIAGNOSIS — M7671 Peroneal tendinitis, right leg: Secondary | ICD-10-CM

## 2018-08-29 NOTE — Telephone Encounter (Signed)
-----   Message from Rip Harbour, [facility] sent at 08/28/2018  4:50 PM EDT ----- Regarding: MRI  MRI ankle right - evaluate peroneal tendonitis right - s/p peroneal tendon tear repair on 10/25/17 - surgical consideration

## 2018-08-29 NOTE — Telephone Encounter (Signed)
Orders to J. Quintana, RN for pre-cert, faxed to [facility]. 

## 2018-09-11 DIAGNOSIS — M25552 Pain in left hip: Secondary | ICD-10-CM | POA: Diagnosis not present

## 2018-09-11 DIAGNOSIS — M7062 Trochanteric bursitis, left hip: Secondary | ICD-10-CM | POA: Diagnosis not present

## 2018-09-11 DIAGNOSIS — Z6832 Body mass index (BMI) 32.0-32.9, adult: Secondary | ICD-10-CM | POA: Diagnosis not present

## 2018-09-14 ENCOUNTER — Ambulatory Visit
Admission: RE | Admit: 2018-09-14 | Discharge: 2018-09-14 | Disposition: A | Discharge status: Home / Self Care | Payer: BLUE CROSS/BLUE SHIELD | Source: Ambulatory Visit | Attending: Podiatry | Admitting: Podiatry

## 2018-09-14 DIAGNOSIS — M7671 Peroneal tendinitis, right leg: Secondary | ICD-10-CM

## 2018-09-14 DIAGNOSIS — M25571 Pain in right ankle and joints of right foot: Secondary | ICD-10-CM | POA: Diagnosis not present

## 2018-09-14 DIAGNOSIS — G5782 Other specified mononeuropathies of left lower limb: Secondary | ICD-10-CM

## 2018-09-14 DIAGNOSIS — Z9889 Other specified postprocedural states: Secondary | ICD-10-CM

## 2018-09-16 ENCOUNTER — Telehealth: Payer: Self-pay | Admitting: *Deleted

## 2018-09-16 NOTE — Telephone Encounter (Signed)
I informed pt of Dr. Stephenie Acres review of results and request to send a copy of the MRI disc to a radiology specialist for a 2nd opinion and more details for treatment planning, there would be a 10-14 day delay in the final results and we would call with instructions.

## 2018-09-16 NOTE — Telephone Encounter (Signed)
-----   Message from Garrel Ridgel, [place] sent at 09/16/2018  7:09 AM EST ----- Send for over read and inform patient of the delay.

## 2018-09-16 NOTE — Telephone Encounter (Signed)
Faxed request for copy of MRi disc from [facility].

## 2018-09-17 NOTE — Telephone Encounter (Signed)
Received copy of MRI disc and mailed to [facility]. 

## 2018-10-13 ENCOUNTER — Encounter: Payer: Self-pay | Admitting: Podiatry

## 2018-11-05 DIAGNOSIS — M167 Other unilateral secondary osteoarthritis of hip: Secondary | ICD-10-CM | POA: Diagnosis not present

## 2018-11-05 DIAGNOSIS — J3089 Other allergic rhinitis: Secondary | ICD-10-CM | POA: Diagnosis not present

## 2018-11-05 DIAGNOSIS — I10 Essential (primary) hypertension: Secondary | ICD-10-CM | POA: Diagnosis not present

## 2018-11-07 DIAGNOSIS — Z713 Dietary counseling and surveillance: Secondary | ICD-10-CM | POA: Diagnosis not present

## 2018-11-10 DIAGNOSIS — Z0000 Encounter for general adult medical examination without abnormal findings: Secondary | ICD-10-CM | POA: Diagnosis not present

## 2018-11-11 ENCOUNTER — Encounter: Payer: Self-pay | Admitting: Podiatry

## 2018-11-11 ENCOUNTER — Ambulatory Visit: Payer: BLUE CROSS/BLUE SHIELD | Admitting: Podiatry

## 2018-11-11 DIAGNOSIS — S86311D Strain of muscle(s) and tendon(s) of peroneal muscle group at lower leg level, right leg, subsequent encounter: Secondary | ICD-10-CM | POA: Diagnosis not present

## 2018-11-11 NOTE — Patient Instructions (Signed)
Pre-Operative Instructions  Congratulations, you have decided to take an important step towards improving your quality of life.  You can be assured that the doctors and staff at [facility] will be with you every step of the way.  Here are some important things you should know:  1. Plan to be at the surgery center/hospital at least 1 (one) hour prior to your scheduled time, unless otherwise directed by the surgical center/hospital staff.  You must have a responsible adult accompany you, remain during the surgery and drive you home.  Make sure you have directions to the surgical center/hospital to ensure you arrive on time. 2. If you are having surgery at [facility] or  hospitals, you will need a copy of your medical history and physical form from your family physician within one month prior to the date of surgery. We will give you a form for your primary physician to complete.  3. We make every effort to accommodate the date you request for surgery.  However, there are times where surgery dates or times have to be moved.  We will contact you as soon as possible if a change in schedule is required.   4. No aspirin/ibuprofen for one week before surgery.  If you are on aspirin, any non-steroidal anti-inflammatory medications (Mobic, Aleve, Ibuprofen) should not be taken seven (7) days prior to your surgery.  You make take Tylenol for pain prior to surgery.  5. Medications - If you are taking daily heart and blood pressure medications, seizure, reflux, allergy, asthma, anxiety, pain or diabetes medications, make sure you notify the surgery center/hospital before the day of surgery so they can tell you which medications you should take or avoid the day of surgery. 6. No food or drink after midnight the night before surgery unless directed otherwise by surgical center/hospital staff. 7. No alcoholic beverages 24-hours prior to surgery.  No smoking 24-hours prior or 24-hours after  surgery. 8. Wear loose pants or shorts. They should be loose enough to fit over bandages, boots, and casts. 9. Don't wear slip-on shoes. Sneakers are preferred. 10. Bring your boot with you to the surgery center/hospital.  Also bring crutches or a walker if your physician has prescribed it for you.  If you do not have this equipment, it will be provided for you after surgery. 11. If you have not been contacted by the surgery center/hospital by the day before your surgery, call to confirm the date and time of your surgery. 12. Leave-time from work may vary depending on the type of surgery you have.  Appropriate arrangements should be made prior to surgery with your employer. 13. Prescriptions will be provided immediately following surgery by your doctor.  Fill these as soon as possible after surgery and take the medication as directed. Pain medications will not be refilled on weekends and must be approved by the doctor. 14. Remove nail polish on the operative foot and avoid getting pedicures prior to surgery. 15. Wash the night before surgery.  The night before surgery wash the foot and leg well with water and the antibacterial soap provided. Be sure to pay special attention to beneath the toenails and in between the toes.  Wash for at least three (3) minutes. Rinse thoroughly with water and dry well with a towel.  Perform this wash unless told not to do so by your physician.  Enclosed: 1 Ice pack (please put in freezer the night before surgery)   1 Hibiclens skin cleaner     Pre-op instructions  If you have any questions regarding the instructions, please do not hesitate to call our office.  [place]: [address], [place], [place] [address] -- [phone]  [place]: [address] [place], [place] [address] -- [phone]  [place]: [address]  [place], [place] [address] -- [phone]  [place]: [address], [address], [place], [place] [address] -- [phone]  Website:  [email or web address] 

## 2018-11-12 NOTE — Progress Notes (Signed)
She presents today for surgical consult regarding the peroneus brevis of her right foot and leg.  This will be her third surgery on this right leg and foot she ambulates with severe difficulty.  States that the pain has not resided.  She states that she has throbbing lateral aspect of the foot.  Objective: Vital signs are stable she is alert oriented x3 she has pain on palpation of the peroneal tendons and significant pain on palpation around the area of the skin atrophy just distal to the fibula.  MRI demonstrates a tear along the entire length of the peroneal brevis.  Minimal edema.  No erythema cellulitis drainage drainage or odor.  No open lesions or wounds.  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  Plan: Discussed etiology pathology and surgical therapies this point we are scheduling her to get back to surgery to have her peroneal brevis tendon repair.  She understands that she will be nonweightbearing possibly casted.  We did discuss the possible postop complications which may include but not limited to postop pain bleeding swelling infection recurrence need further surgery overcorrection under correction loss of digit loss of limb loss of life.  I would like her to be able to lessen her walking prior to surgery I am afraid that this will result in a tear if we are not careful.  The length of the tear in the tendon per MRI is significant and if it transects this will be much worse overcorrection for her I would like for her to be able to park closer to her building at work if at all possible we discussed this today.  We also discussed limiting walking in general and shopping and any strenuous activity.

## 2018-11-17 ENCOUNTER — Encounter: Payer: Self-pay | Admitting: Internal Medicine

## 2018-12-02 ENCOUNTER — Ambulatory Visit: Payer: BLUE CROSS/BLUE SHIELD | Admitting: Podiatry

## 2018-12-02 ENCOUNTER — Encounter: Payer: Self-pay | Admitting: Podiatry

## 2018-12-02 DIAGNOSIS — M722 Plantar fascial fibromatosis: Secondary | ICD-10-CM | POA: Diagnosis not present

## 2018-12-02 NOTE — Progress Notes (Signed)
She presents today for a chief complaint of pain to her left heel.  She states that is been bothering me now since have been having to compensate for my right foot so much.  Objective: Vital signs are stable alert and oriented x3.  Pulses are palpable.  No severe pain to palpation medial [place] tubercle of her left heel.  Assessment: Plantar fasciitis more than likely compensatory in nature left foot.  Plan: Discussed etiology pathology and surgical therapies at this point time went ahead and injected her left heel extra Betadine skin prep 20 mg Kenalog 5 mg Marcaine point maximal tenderness left.  Dispensed night splint left and a Cam Walker right for her surgery.

## 2018-12-03 ENCOUNTER — Telehealth: Payer: Self-pay | Admitting: *Deleted

## 2018-12-03 NOTE — Telephone Encounter (Signed)
"  I'm a patient of Dr. Milinda Pointer.  I am rescheduling my surgery from March 6 to March 27.  I was in the office yesterday and verified with Dr. Stephenie Acres nurse that that date was available.  Please give me a call.  I'd like it first thing in the morning if I could."   I am returning your call.  I will reschedule your surgery from January 02, 2019 to January 23, 2019.  You'll get a call a day or two prior to your surgery date from someone at the surgery center.  That person will give you your arrival time.  "Okay, thank you."

## 2019-01-06 DIAGNOSIS — R51 Headache: Secondary | ICD-10-CM | POA: Diagnosis not present

## 2019-01-06 DIAGNOSIS — Z6831 Body mass index (BMI) 31.0-31.9, adult: Secondary | ICD-10-CM | POA: Diagnosis not present

## 2019-01-06 DIAGNOSIS — I10 Essential (primary) hypertension: Secondary | ICD-10-CM | POA: Diagnosis not present

## 2019-01-15 ENCOUNTER — Telehealth: Payer: Self-pay | Admitting: *Deleted

## 2019-01-15 NOTE — Telephone Encounter (Signed)
"  I'm calling to see if my surgery is still on for Friday of next week."  Yes, you're still scheduled for March 27 at this time.  If there's any changes, I'll let you know.  "Do you know the time.  No one has called me from the surgery center.  Do you know my arrival time?"  No, someone from the surgical center will call you a day or two prior to your surgery date and they will give you your time.  "That's late!  Okay, let me know if there's any changes."  I will.

## 2019-01-15 NOTE — Telephone Encounter (Signed)
"  I have surgery scheduled for next Friday, March 27 at the [facility].  I haven't heard from them.  I kind of want to see if you know of a time for my surgery or even if the surgery is still scheduled.  If you would, give me a call."

## 2019-01-20 NOTE — Telephone Encounter (Signed)
Yes we should be able to do the tendon.

## 2019-01-21 ENCOUNTER — Other Ambulatory Visit: Payer: Self-pay | Admitting: Podiatry

## 2019-01-21 ENCOUNTER — Other Ambulatory Visit: Payer: Self-pay | Admitting: *Deleted

## 2019-01-21 MED ORDER — ONDANSETRON HCL 4 MG PO TABS: 4.0000 mg | ORAL_TABLET | Freq: Three times a day (TID) | ORAL | 0 refills | Status: DC | PRN

## 2019-01-21 MED ORDER — CLINDAMYCIN HCL 150 MG PO CAPS: 150.0000 mg | ORAL_CAPSULE | Freq: Three times a day (TID) | ORAL | 0 refills | Status: DC

## 2019-01-21 MED ORDER — HYDROMORPHONE HCL 4 MG PO TABS: 4.0000 mg | ORAL_TABLET | ORAL | 0 refills | Status: DC | PRN

## 2019-01-22 DIAGNOSIS — M25571 Pain in right ankle and joints of right foot: Secondary | ICD-10-CM | POA: Diagnosis not present

## 2019-01-22 DIAGNOSIS — S86011A Strain of right Achilles tendon, initial encounter: Secondary | ICD-10-CM | POA: Diagnosis not present

## 2019-01-22 DIAGNOSIS — I10 Essential (primary) hypertension: Secondary | ICD-10-CM | POA: Diagnosis not present

## 2019-01-22 DIAGNOSIS — M66871 Spontaneous rupture of other tendons, right ankle and foot: Secondary | ICD-10-CM | POA: Diagnosis not present

## 2019-01-23 DIAGNOSIS — M7671 Peroneal tendinitis, right leg: Secondary | ICD-10-CM | POA: Diagnosis not present

## 2019-01-27 ENCOUNTER — Other Ambulatory Visit: Payer: Self-pay

## 2019-01-27 ENCOUNTER — Ambulatory Visit (INDEPENDENT_AMBULATORY_CARE_PROVIDER_SITE_OTHER): Payer: BLUE CROSS/BLUE SHIELD | Admitting: Podiatry

## 2019-01-27 ENCOUNTER — Encounter: Payer: Self-pay | Admitting: Podiatry

## 2019-01-27 VITALS — Temp 96.6°F

## 2019-01-27 DIAGNOSIS — M7671 Peroneal tendinitis, right leg: Secondary | ICD-10-CM

## 2019-01-27 DIAGNOSIS — Z9889 Other specified postprocedural states: Secondary | ICD-10-CM

## 2019-01-27 NOTE — Progress Notes (Signed)
Subjective: Rhonda Porter is a 48 y.o. is seen today in office s/p right peroneal tendon repair preformed on 01/23/2019.  She states she only took a couple of pain pills and overall has been doing well she did take ibuprofen.  She still with some discomfort.  She is been nonweightbearing as much as she can.  No recent falls or injury.  Denies any systemic complaints such as fevers, chills, nausea, vomiting. No calf pain, chest pain, shortness of breath.   Objective: General: No acute distress, AAOx3  RIGHT foot: Cast is intact and fitting well.  There was dirt on the bottom of the cast although mild.  There is immediate capillary fill time to all digits.  Motor function intact of the toes.  The cast does not appear to be causing any irritation.  No other areas of tenderness to bilateral lower extremities.  No other open lesions or pre-ulcerative lesions.  No pain with calf compression, swelling, warmth, erythema.   Assessment and Plan:  Status post right peroneal tendon repair, doing well with no complications   -Treatment options discussed including all alternatives, risks, and complications -Cast is getting well-developed and intact today.  I want her to continue with ice elevation.  Continue nonweightbearing. -Ice/elevation -Pain medication as needed. -Monitor for any clinical signs or symptoms of infection and DVT/PE and directed to call the office immediately should any occur or go to the ER. -Follow-up as scheduled with Dr. Milinda Pointer for cast change or sooner if any problems arise. In the meantime, encouraged to call the office with any questions, concerns, change in symptoms.   Celesta Gentile, DPM

## 2019-01-29 ENCOUNTER — Other Ambulatory Visit: Payer: BLUE CROSS/BLUE SHIELD

## 2019-02-05 ENCOUNTER — Ambulatory Visit (INDEPENDENT_AMBULATORY_CARE_PROVIDER_SITE_OTHER): Payer: BLUE CROSS/BLUE SHIELD | Admitting: Podiatry

## 2019-02-05 ENCOUNTER — Encounter: Payer: BLUE CROSS/BLUE SHIELD | Admitting: Podiatry

## 2019-02-05 ENCOUNTER — Other Ambulatory Visit: Payer: Self-pay

## 2019-02-05 ENCOUNTER — Encounter: Payer: Self-pay | Admitting: Podiatry

## 2019-02-05 VITALS — Temp 96.4°F

## 2019-02-05 DIAGNOSIS — M7671 Peroneal tendinitis, right leg: Secondary | ICD-10-CM

## 2019-02-05 MED ORDER — CYCLOBENZAPRINE HCL 10 MG PO TABS: 10.0000 mg | ORAL_TABLET | Freq: Every day | ORAL | 0 refills | Status: DC

## 2019-02-05 NOTE — Addendum Note (Signed)
Addended by: Clovis Riley E on: 02/05/2019 12:10 PM   Modules accepted: Orders

## 2019-02-05 NOTE — Progress Notes (Signed)
She presents today for second postop visit status post re-repair of a peroneal's brevis tendon right.  She is having a cast replaced today she denies fever chills nausea muscle aches pains calf pain back pain chest pain shortness of breath.  Objective: She presents today ambulating with knee scooter and cast intact.  Once removed demonstrates dry sterile compressive dressing intact once removed demonstrates staples are intact margins well coapted remove staples today margins remain well coapted.  Minimal edema ankle bone is visible nice contour of the foot no ecchymosis no open lesions or wounds.  Good range of motion.  Assessment: Well-healing surgical ankle.  Plan: Discussed etiology pathology and surgical therapies at this point in time we redressed the foot today placed her in a cam walker she is to remain nonweightbearing she is not to remove the cam walker and I will follow-up with her weekly until I am satisfied that she is able to bear weight on this.

## 2019-02-11 ENCOUNTER — Other Ambulatory Visit: Payer: Self-pay

## 2019-02-11 ENCOUNTER — Ambulatory Visit (INDEPENDENT_AMBULATORY_CARE_PROVIDER_SITE_OTHER): Payer: BLUE CROSS/BLUE SHIELD | Admitting: Podiatry

## 2019-02-11 ENCOUNTER — Encounter: Payer: Self-pay | Admitting: Podiatry

## 2019-02-11 VITALS — Temp 96.0°F

## 2019-02-11 DIAGNOSIS — M7671 Peroneal tendinitis, right leg: Secondary | ICD-10-CM

## 2019-02-11 DIAGNOSIS — Z9889 Other specified postprocedural states: Secondary | ICD-10-CM

## 2019-02-11 NOTE — Progress Notes (Signed)
She presents today states that her foot is doing quite well date of surgery January 23, 2019 repair of the [place] brevis right.  She states that she has been wearing her cam walker not walking on it but due to the irritation and pressure of the cam walker with a knee scooter and is bothered her shin.  She also has pain to the posterior aspect of the calf where she feels the dressing may have been tight.  Objective: Vital signs are stable she is alert and oriented x3.  There is no erythema cellulitis drainage or odor.  The foot appears to be healing very nicely.  Incision is still intact.  She has a hardness no warmth no pain on palpation of the calf but the hardness is in the skin just distal to the calf muscle itself over the Achilles tendon.  It appears to be where there is some swelling present.  Possibly even a phlebitis or something like that.  Assessment well-healing surgical foot.  Plan: Moist warm compression to the posterior Achilles area otherwise she needs to stay in the cam boot and we placed padding for the shin.

## 2019-02-18 ENCOUNTER — Other Ambulatory Visit: Payer: Self-pay

## 2019-02-18 ENCOUNTER — Ambulatory Visit (INDEPENDENT_AMBULATORY_CARE_PROVIDER_SITE_OTHER): Payer: BLUE CROSS/BLUE SHIELD | Admitting: Podiatry

## 2019-02-18 ENCOUNTER — Encounter: Payer: Self-pay | Admitting: Podiatry

## 2019-02-18 VITALS — Temp 97.5°F

## 2019-02-18 DIAGNOSIS — S86311D Strain of muscle(s) and tendon(s) of peroneal muscle group at lower leg level, right leg, subsequent encounter: Secondary | ICD-10-CM | POA: Diagnosis not present

## 2019-02-18 DIAGNOSIS — Z9889 Other specified postprocedural states: Secondary | ICD-10-CM

## 2019-02-18 NOTE — Progress Notes (Signed)
Hattye presents today stating that her foot is doing much better date of surgery January 23, 2019 repair of the peroneal brevis tendon with cast application.  She states that she has been using her cam walker and her knee scooter.  States that everything seems to be doing well she is notices some numbness along the lateral aspect of the foot.  Denies fever chills nausea vomiting muscle aches pains calf pain back pain chest pain shortness of breath.  Objective: Vital signs are stable alert and oriented x3.  Pulses are palpable.  Neurologic sensorium is intact.  DP reflexes are intact.  Peroneal area appears to be healing very nicely I see no signs of infection she has good dorsiflexion against resistance and abduction against resistance.  Plantarflexion is good no pain.  Mild tenderness on palpation of the Achilles more than likely due to dressing.  Assessment: Well-healing surgical foot.  Plan: We will go to encourage her to stay in the cam walker as much as possible were going to use a compression anklet or a Ace bandage.  She will do that daily continue the use of non-nonweightbearing devices such as crutches or a knee scooter.  I will follow-up with her in about 2 weeks at which time we hope to be moving toward a tennis shoe.

## 2019-02-19 DIAGNOSIS — J3089 Other allergic rhinitis: Secondary | ICD-10-CM | POA: Diagnosis not present

## 2019-02-19 DIAGNOSIS — I10 Essential (primary) hypertension: Secondary | ICD-10-CM | POA: Diagnosis not present

## 2019-02-19 DIAGNOSIS — Z760 Encounter for issue of repeat prescription: Secondary | ICD-10-CM | POA: Diagnosis not present

## 2019-03-04 ENCOUNTER — Encounter: Payer: Self-pay | Admitting: Podiatry

## 2019-03-04 ENCOUNTER — Other Ambulatory Visit: Payer: Self-pay

## 2019-03-04 ENCOUNTER — Ambulatory Visit (INDEPENDENT_AMBULATORY_CARE_PROVIDER_SITE_OTHER): Payer: BLUE CROSS/BLUE SHIELD | Admitting: Podiatry

## 2019-03-04 VITALS — Temp 97.3°F

## 2019-03-04 DIAGNOSIS — S86311D Strain of muscle(s) and tendon(s) of peroneal muscle group at lower leg level, right leg, subsequent encounter: Secondary | ICD-10-CM

## 2019-03-04 DIAGNOSIS — Z9889 Other specified postprocedural states: Secondary | ICD-10-CM

## 2019-03-04 NOTE — Progress Notes (Signed)
She presents today stating that her surgical foot feels really good.  Date of surgery was January 23, 2019 repair peroneal brevis tendon.  She denies fever chills nausea vomiting muscle aches pains.  Objective: She has good range of motion passively and against resistance right foot and ankle.  Mild numbness along the course of the incision site lateral ankle right.  Minimal edema no erythema cellulitis drainage odor no open lesions or wounds.  Assessment: Well-healing surgical redo on her perineal brevis tendon right.  Plan: Placed her in a ankle stable

## 2019-03-05 ENCOUNTER — Other Ambulatory Visit: Payer: BLUE CROSS/BLUE SHIELD

## 2019-03-17 ENCOUNTER — Encounter: Payer: Self-pay | Admitting: Podiatry

## 2019-03-25 ENCOUNTER — Other Ambulatory Visit: Payer: Self-pay

## 2019-03-25 ENCOUNTER — Encounter: Payer: Self-pay | Admitting: Podiatry

## 2019-03-25 ENCOUNTER — Ambulatory Visit (INDEPENDENT_AMBULATORY_CARE_PROVIDER_SITE_OTHER): Payer: BLUE CROSS/BLUE SHIELD | Admitting: Podiatry

## 2019-03-25 VITALS — Temp 97.6°F

## 2019-03-25 DIAGNOSIS — Z9889 Other specified postprocedural states: Secondary | ICD-10-CM

## 2019-03-25 DIAGNOSIS — S86311D Strain of muscle(s) and tendon(s) of peroneal muscle group at lower leg level, right leg, subsequent encounter: Secondary | ICD-10-CM

## 2019-03-25 NOTE — Progress Notes (Signed)
She presents today date of surgery January 23, 2019 status post re-repair of the peroneal brevis tendon with a cast.  She states that her been doing pretty well still sore.  Still have numbness on the lateral side of the foot.  She denies fever chills nausea vomiting muscle aches and pains.  Objective: Vital signs are stable alert and oriented x3.  Pulses are palpable.  She has tenderness on palpation of the incision site though she has good eversion against resistance.  Good dorsiflexion and plantarflexion loss of sensation along the lateral aspect of the foot due to the sural nerve excision.  Assessment: Well-healing surgical foot.  Plan: Recommended that she discontinue the use of crutches in the cam walker get into her Tri-Lock brace and a pair of tennis shoes.  I prefer her to also see physical therapy at least to show her some home health exercises.  And I will follow-up with her in about 3 weeks.  May need to consider allowing her to go back to work sitting.

## 2019-03-30 DIAGNOSIS — S86391D Other injury of muscle(s) and tendon(s) of peroneal muscle group at lower leg level, right leg, subsequent encounter: Secondary | ICD-10-CM | POA: Diagnosis not present

## 2019-03-30 DIAGNOSIS — M25571 Pain in right ankle and joints of right foot: Secondary | ICD-10-CM | POA: Diagnosis not present

## 2019-03-30 DIAGNOSIS — R262 Difficulty in walking, not elsewhere classified: Secondary | ICD-10-CM | POA: Diagnosis not present

## 2019-04-01 DIAGNOSIS — S86391D Other injury of muscle(s) and tendon(s) of peroneal muscle group at lower leg level, right leg, subsequent encounter: Secondary | ICD-10-CM | POA: Diagnosis not present

## 2019-04-01 DIAGNOSIS — M25571 Pain in right ankle and joints of right foot: Secondary | ICD-10-CM | POA: Diagnosis not present

## 2019-04-01 DIAGNOSIS — R262 Difficulty in walking, not elsewhere classified: Secondary | ICD-10-CM | POA: Diagnosis not present

## 2019-04-06 DIAGNOSIS — S86391D Other injury of muscle(s) and tendon(s) of peroneal muscle group at lower leg level, right leg, subsequent encounter: Secondary | ICD-10-CM | POA: Diagnosis not present

## 2019-04-06 DIAGNOSIS — R262 Difficulty in walking, not elsewhere classified: Secondary | ICD-10-CM | POA: Diagnosis not present

## 2019-04-06 DIAGNOSIS — M25571 Pain in right ankle and joints of right foot: Secondary | ICD-10-CM | POA: Diagnosis not present

## 2019-04-08 DIAGNOSIS — R262 Difficulty in walking, not elsewhere classified: Secondary | ICD-10-CM | POA: Diagnosis not present

## 2019-04-08 DIAGNOSIS — S86391D Other injury of muscle(s) and tendon(s) of peroneal muscle group at lower leg level, right leg, subsequent encounter: Secondary | ICD-10-CM | POA: Diagnosis not present

## 2019-04-08 DIAGNOSIS — M25571 Pain in right ankle and joints of right foot: Secondary | ICD-10-CM | POA: Diagnosis not present

## 2019-04-09 ENCOUNTER — Encounter: Payer: Self-pay | Admitting: Podiatry

## 2019-04-09 ENCOUNTER — Ambulatory Visit (INDEPENDENT_AMBULATORY_CARE_PROVIDER_SITE_OTHER): Payer: BC Managed Care – PPO | Admitting: Podiatry

## 2019-04-09 ENCOUNTER — Other Ambulatory Visit: Payer: Self-pay

## 2019-04-09 DIAGNOSIS — S86311D Strain of muscle(s) and tendon(s) of peroneal muscle group at lower leg level, right leg, subsequent encounter: Secondary | ICD-10-CM

## 2019-04-09 DIAGNOSIS — Z9889 Other specified postprocedural states: Secondary | ICD-10-CM

## 2019-04-09 NOTE — Progress Notes (Signed)
She presents today for postop visit date of surgery January 23, 2019 stating that her physical therapist recommended she stop using the compression therapy because she had considerable edema of her right leg into her ankle.  She states that the brace seems to be aggravating her fibula and causing it to throb and be painful.  She states that she is really enjoying physical therapy and seems to be getting a lot out of it in [place].  She denies fever chills nausea vomiting muscle aches and pains though she does relate some calf pain with the swelling.  She would like to go back to work as soon as possible.  Objective: Vital signs are stable alert and oriented x3.  Pulses are palpable.  Neurologic sensorium is intact dT reflexes are intact muscle strength is normal symmetrical.  She has good abduction against resistance she has mild tenderness on palpation of the fibula itself it appears to be slightly red and irritated more than likely rubbing the Tri-Lock brace.  She does have some pitting edema to the anterior ankle in the posterior calf is warm to the touch.  Assessment: Cannot rule out venous insufficiency or DVT of the right leg and calf at this point.  I think physical therapy should continue.  I think she should try ambulating without any brace on the right lower extremity.  Plan: Discussed etiology pathology and surgical therapies I am going to recommend we allow her to go back to work but restrictions that apply are she work from home while keeping the leg elevated.  She will also follow-up with me in 1 month for reevaluation as to whether or not she can get back to work or she will may have to continue her at home duties.

## 2019-04-10 ENCOUNTER — Telehealth: Payer: Self-pay | Admitting: *Deleted

## 2019-04-10 DIAGNOSIS — M79661 Pain in right lower leg: Secondary | ICD-10-CM

## 2019-04-10 DIAGNOSIS — Z9889 Other specified postprocedural states: Secondary | ICD-10-CM

## 2019-04-10 DIAGNOSIS — R609 Edema, unspecified: Secondary | ICD-10-CM

## 2019-04-10 NOTE — Telephone Encounter (Signed)
Faxed orders to [facility].

## 2019-04-13 DIAGNOSIS — R262 Difficulty in walking, not elsewhere classified: Secondary | ICD-10-CM | POA: Diagnosis not present

## 2019-04-13 DIAGNOSIS — M25571 Pain in right ankle and joints of right foot: Secondary | ICD-10-CM | POA: Diagnosis not present

## 2019-04-13 DIAGNOSIS — S86391D Other injury of muscle(s) and tendon(s) of peroneal muscle group at lower leg level, right leg, subsequent encounter: Secondary | ICD-10-CM | POA: Diagnosis not present

## 2019-04-14 ENCOUNTER — Encounter: Payer: Self-pay | Admitting: Podiatry

## 2019-04-14 ENCOUNTER — Ambulatory Visit (HOSPITAL_COMMUNITY)
Admission: RE | Admit: 2019-04-14 | Payer: Self-pay | Source: Ambulatory Visit | Attending: Podiatry | Admitting: Podiatry

## 2019-04-15 ENCOUNTER — Encounter: Payer: BLUE CROSS/BLUE SHIELD | Admitting: Podiatry

## 2019-04-15 DIAGNOSIS — S86391D Other injury of muscle(s) and tendon(s) of peroneal muscle group at lower leg level, right leg, subsequent encounter: Secondary | ICD-10-CM | POA: Diagnosis not present

## 2019-04-15 DIAGNOSIS — R262 Difficulty in walking, not elsewhere classified: Secondary | ICD-10-CM | POA: Diagnosis not present

## 2019-04-15 DIAGNOSIS — M25571 Pain in right ankle and joints of right foot: Secondary | ICD-10-CM | POA: Diagnosis not present

## 2019-04-17 ENCOUNTER — Encounter: Payer: Self-pay | Admitting: Podiatry

## 2019-04-20 DIAGNOSIS — R262 Difficulty in walking, not elsewhere classified: Secondary | ICD-10-CM | POA: Diagnosis not present

## 2019-04-20 DIAGNOSIS — S86391D Other injury of muscle(s) and tendon(s) of peroneal muscle group at lower leg level, right leg, subsequent encounter: Secondary | ICD-10-CM | POA: Diagnosis not present

## 2019-04-20 DIAGNOSIS — M25571 Pain in right ankle and joints of right foot: Secondary | ICD-10-CM | POA: Diagnosis not present

## 2019-04-22 DIAGNOSIS — M25571 Pain in right ankle and joints of right foot: Secondary | ICD-10-CM | POA: Diagnosis not present

## 2019-04-22 DIAGNOSIS — S86391D Other injury of muscle(s) and tendon(s) of peroneal muscle group at lower leg level, right leg, subsequent encounter: Secondary | ICD-10-CM | POA: Diagnosis not present

## 2019-04-22 DIAGNOSIS — R262 Difficulty in walking, not elsewhere classified: Secondary | ICD-10-CM | POA: Diagnosis not present

## 2019-04-27 DIAGNOSIS — M25571 Pain in right ankle and joints of right foot: Secondary | ICD-10-CM | POA: Diagnosis not present

## 2019-04-27 DIAGNOSIS — S86391D Other injury of muscle(s) and tendon(s) of peroneal muscle group at lower leg level, right leg, subsequent encounter: Secondary | ICD-10-CM | POA: Diagnosis not present

## 2019-04-27 DIAGNOSIS — R262 Difficulty in walking, not elsewhere classified: Secondary | ICD-10-CM | POA: Diagnosis not present

## 2019-04-29 DIAGNOSIS — M25571 Pain in right ankle and joints of right foot: Secondary | ICD-10-CM | POA: Diagnosis not present

## 2019-04-29 DIAGNOSIS — S86391D Other injury of muscle(s) and tendon(s) of peroneal muscle group at lower leg level, right leg, subsequent encounter: Secondary | ICD-10-CM | POA: Diagnosis not present

## 2019-04-29 DIAGNOSIS — R262 Difficulty in walking, not elsewhere classified: Secondary | ICD-10-CM | POA: Diagnosis not present

## 2019-05-18 ENCOUNTER — Other Ambulatory Visit: Payer: Self-pay

## 2019-05-18 ENCOUNTER — Ambulatory Visit (INDEPENDENT_AMBULATORY_CARE_PROVIDER_SITE_OTHER): Payer: BC Managed Care – PPO | Admitting: Podiatry

## 2019-05-18 ENCOUNTER — Encounter: Payer: Self-pay | Admitting: Podiatry

## 2019-05-18 DIAGNOSIS — Z9889 Other specified postprocedural states: Secondary | ICD-10-CM | POA: Diagnosis not present

## 2019-05-18 DIAGNOSIS — S86311D Strain of muscle(s) and tendon(s) of peroneal muscle group at lower leg level, right leg, subsequent encounter: Secondary | ICD-10-CM

## 2019-05-18 NOTE — Progress Notes (Signed)
She presents today date of surgery January 23, 2019 repair peroneal tendons.  She states that she is still having pain and cramping sensation distal lateral aspect of the leg but not into the foot.  States that she does have some tenderness across the top of the foot but refuses to wear her orthotics regularly stating that they are too painful and too rigid.  This she would like to consider getting another set of orthotics made.  Objective: Vital signs stable she is alert and oriented x3 all of the muscle strength is normal and symmetrical.  She has some tenderness on palpation distalmost aspect of her peroneal tendons where I resected a portion of the muscle.  It is minimally swollen no erythema cellulitis drainage odor is not warm to the touch.  Assessment: Slowly resolving peroneal tendon surgery right.  Plan: Encouraged her to continue therapy and I encouraged warm moist compresses to the area twice daily.  She will continue out of work because of the swelling also recommended below-knee compression garment.  And she is going to try to find a new set of shoes.

## 2019-05-19 ENCOUNTER — Encounter: Payer: Self-pay | Admitting: Podiatry

## 2019-06-29 ENCOUNTER — Encounter: Payer: Self-pay | Admitting: Podiatry

## 2019-06-29 ENCOUNTER — Ambulatory Visit (INDEPENDENT_AMBULATORY_CARE_PROVIDER_SITE_OTHER): Payer: BC Managed Care – PPO | Admitting: Podiatry

## 2019-06-29 ENCOUNTER — Other Ambulatory Visit: Payer: Self-pay

## 2019-06-29 DIAGNOSIS — M722 Plantar fascial fibromatosis: Secondary | ICD-10-CM | POA: Diagnosis not present

## 2019-06-29 NOTE — Progress Notes (Signed)
She presents today for follow-up of her right foot.  States that is doing much better better and has good days and bad days but the left one is really starting to get me if it.  Objective: Vital signs are stable alert and oriented x3.  Pulses are palpable.  His pain on palpation mid calcaneal tubercle of the left heel medial and central bands.  Right foot demonstrates well-healing peroneal tendon repair and plantar fasciotomy.  Assessment: Well-healing surgical foot right.  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  Plan: Discussed etiology pathology conservative versus surgical therapies at this point I will go ahead and consented her for a total endoscopic fasciotomy or complete fasciotomy left heel.  Answered all questions regarding this procedure the best my ability layman's terms she understood was amenable to it signed all 3 page of the consent form.  She can only take Dilaudid for postop pain.

## 2019-06-29 NOTE — Patient Instructions (Signed)
Pre-Operative Instructions  Congratulations, you have decided to take an important step towards improving your quality of life.  You can be assured that the doctors and staff at [facility] will be with you every step of the way.  Here are some important things you should know:  1. Plan to be at the surgery center/hospital at least 1 (one) hour prior to your scheduled time, unless otherwise directed by the surgical center/hospital staff.  You must have a responsible adult accompany you, remain during the surgery and drive you home.  Make sure you have directions to the surgical center/hospital to ensure you arrive on time. 2. If you are having surgery at [facility] or [facility], you will need a copy of your medical history and physical form from your family physician within one month prior to the date of surgery. We will give you a form for your primary physician to complete.  3. We make every effort to accommodate the date you request for surgery.  However, there are times where surgery dates or times have to be moved.  We will contact you as soon as possible if a change in schedule is required.   4. No aspirin/ibuprofen for one week before surgery.  If you are on aspirin, any non-steroidal anti-inflammatory medications (Mobic, Aleve, Ibuprofen) should not be taken seven (7) days prior to your surgery.  You make take Tylenol for pain prior to surgery.  5. Medications - If you are taking daily heart and blood pressure medications, seizure, reflux, allergy, asthma, anxiety, pain or diabetes medications, make sure you notify the surgery center/hospital before the day of surgery so they can tell you which medications you should take or avoid the day of surgery. 6. No food or drink after midnight the night before surgery unless directed otherwise by surgical center/hospital staff. 7. No alcoholic beverages 24-hours prior to surgery.  No smoking 24-hours prior or 24-hours after  surgery. 8. Wear loose pants or shorts. They should be loose enough to fit over bandages, boots, and casts. 9. Don't wear slip-on shoes. Sneakers are preferred. 10. Bring your boot with you to the surgery center/hospital.  Also bring crutches or a walker if your physician has prescribed it for you.  If you do not have this equipment, it will be provided for you after surgery. 11. If you have not been contacted by the surgery center/hospital by the day before your surgery, call to confirm the date and time of your surgery. 12. Leave-time from work may vary depending on the type of surgery you have.  Appropriate arrangements should be made prior to surgery with your employer. 13. Prescriptions will be provided immediately following surgery by your doctor.  Fill these as soon as possible after surgery and take the medication as directed. Pain medications will not be refilled on weekends and must be approved by the doctor. 14. Remove nail polish on the operative foot and avoid getting pedicures prior to surgery. 15. Wash the night before surgery.  The night before surgery wash the foot and leg well with water and the antibacterial soap provided. Be sure to pay special attention to beneath the toenails and in between the toes.  Wash for at least three (3) minutes. Rinse thoroughly with water and dry well with a towel.  Perform this wash unless told not to do so by your physician.  Enclosed: 1 Ice pack (please put in freezer the night before surgery)   1 Hibiclens skin cleaner     Pre-op instructions  If you have any questions regarding the instructions, please do not hesitate to call our office.  [place]: [address], , [place] [address] -- [phone]  [place]: [address] [place], [place] [address] -- [phone]  [place]: [address]  [place], [place] [address] -- [phone]  [place]: [address], [address], [place],  [address] -- [phone]  Website:  [email or web address] 

## 2019-07-08 ENCOUNTER — Telehealth: Payer: Self-pay | Admitting: *Deleted

## 2019-07-08 NOTE — Telephone Encounter (Signed)
"  I'm a patient of Dr. Milinda Pointer.  I'm calling to see if you got the paperwork for a Plantar Fasciitis Repair of my left foot.  I would like to go ahead and schedule that.  Just give me a call."

## 2019-07-14 NOTE — Telephone Encounter (Signed)
I am returning your call.  You want to schedule your surgery?  "Yes, I'd like to do it at the beginning of October if I can."  Dr. Milinda Pointer can do it on July 31, 2019.  "Let me check my calendar.  That date will be fine."  Someone from the surgical center will call you a day or two prior to your surgery date and will give you your arrival time.  You need to register with the surgical center via their online portal, instructions are in the pamphlet that we gave you.  "Do I need to register again since I've already done it before?"  When did you have surgery?  "I had it back in March."  They should still have your information.

## 2019-07-22 ENCOUNTER — Telehealth: Payer: Self-pay | Admitting: Podiatry

## 2019-07-22 NOTE — Telephone Encounter (Signed)
DOS: 07/31/2019 SURGICAL PROCEDURE: 29893 Dx CODE: M72.2  Member Number: [id number]  Policy Effective : [id number]  -  10/28/9998   Name: Rhonda Porter Date of Birth:Dec 29, 1970  In-Network   Max Per Benefit Period Year-to-Date Remaining     CoInsurance 10%      Deductible $500.00 $0.00     Out-Of-Pocket $1500.00 $0.00      Out-of-Network   Max Per Benefit Period Year-to-Date Remaining     CoInsurance 50%      Deductible $1000.00 $500.00     Out-Of-Pocket $3000.00 $1500.00  AMBULATORY SURGERY In Network Copay Coinsurance Not Applicable [id number]  per  [place] of Network Copay Coinsurance Not Applicable [id number]  per  [place]

## 2019-07-30 ENCOUNTER — Telehealth: Payer: Self-pay | Admitting: *Deleted

## 2019-07-30 ENCOUNTER — Other Ambulatory Visit: Payer: Self-pay | Admitting: Podiatry

## 2019-07-30 MED ORDER — HYDROMORPHONE HCL 4 MG PO TABS: 4.0000 mg | ORAL_TABLET | ORAL | 0 refills | Status: AC | PRN

## 2019-07-30 MED ORDER — HYDROMORPHONE HCL 4 MG PO TABS: 4.0000 mg | ORAL_TABLET | Freq: Four times a day (QID) | ORAL | 0 refills | Status: AC | PRN

## 2019-07-30 MED ORDER — ONDANSETRON HCL 4 MG PO TABS: 4.0000 mg | ORAL_TABLET | Freq: Three times a day (TID) | ORAL | 0 refills | Status: DC | PRN

## 2019-07-30 MED ORDER — CLINDAMYCIN HCL 150 MG PO CAPS: 150.0000 mg | ORAL_CAPSULE | Freq: Three times a day (TID) | ORAL | 0 refills | Status: DC

## 2019-07-30 NOTE — Telephone Encounter (Signed)
Pt states she thought she had heard that Dr. Milinda Pointer called in the post op medications prior to the surgery and she wanted to make sure her pharmacy was [facility] in [place].

## 2019-07-30 NOTE — Telephone Encounter (Signed)
I informed pt, I had changed the pharmacy to [facility].

## 2019-07-31 ENCOUNTER — Encounter: Payer: Self-pay | Admitting: Podiatry

## 2019-07-31 DIAGNOSIS — M25572 Pain in left ankle and joints of left foot: Secondary | ICD-10-CM | POA: Diagnosis not present

## 2019-07-31 DIAGNOSIS — M722 Plantar fascial fibromatosis: Secondary | ICD-10-CM

## 2019-07-31 DIAGNOSIS — I10 Essential (primary) hypertension: Secondary | ICD-10-CM | POA: Diagnosis not present

## 2019-08-05 ENCOUNTER — Encounter: Payer: Self-pay | Admitting: Podiatry

## 2019-08-05 ENCOUNTER — Other Ambulatory Visit: Payer: Self-pay

## 2019-08-05 ENCOUNTER — Ambulatory Visit (INDEPENDENT_AMBULATORY_CARE_PROVIDER_SITE_OTHER): Payer: Self-pay | Admitting: Podiatry

## 2019-08-05 VITALS — BP 138/80 | HR 91 | Temp 97.9°F

## 2019-08-05 DIAGNOSIS — Z9889 Other specified postprocedural states: Secondary | ICD-10-CM

## 2019-08-05 DIAGNOSIS — M722 Plantar fascial fibromatosis: Secondary | ICD-10-CM

## 2019-08-05 NOTE — Progress Notes (Signed)
She presents today for first postop visit date of surgery is 07/31/2019 complete endoscopic plantar fasciotomy of the left foot states that is doing great I have not had any pain medication ibuprofen has helped some.  Objective: Vital signs are stable alert and oriented x3.  Presents in her cam walker today once removed dry sterile dressing was intact once removed demonstrates no erythema edema cellulitis drainage or odor sutures are intact margins are well coapted no signs of infection.  No purulence no drainage.  She has mild tenderness on palpation plantar aspect of the left heel.  Assessment: First postop visit status post EPF left doing very well.  Plan: Redressed today with light dressing and will allow her to start getting this wet I will follow-up with her in 2 weeks for suture removal.

## 2019-08-12 ENCOUNTER — Other Ambulatory Visit: Payer: Self-pay

## 2019-08-12 ENCOUNTER — Encounter: Payer: Self-pay | Admitting: Podiatry

## 2019-08-12 ENCOUNTER — Ambulatory Visit (INDEPENDENT_AMBULATORY_CARE_PROVIDER_SITE_OTHER): Payer: Self-pay | Admitting: Podiatry

## 2019-08-12 DIAGNOSIS — Z9889 Other specified postprocedural states: Secondary | ICD-10-CM

## 2019-08-12 DIAGNOSIS — M722 Plantar fascial fibromatosis: Secondary | ICD-10-CM

## 2019-08-12 NOTE — Progress Notes (Signed)
She presents today for postop visit date of surgery is 07/31/2019 complete fasciotomy left foot.  States that is feeling fine other than the big toe being a little bit numb.  Objective: Vital signs are stable alert and oriented x3.  Pulses are palpable.  There is no erythema edema cellulitis drainage or odor sutures are intact margins well coapted remove the sutures today margins remain well coapted.  She has some loss of sensation to the dorsal aspect of the hallux but it is tingly and allodynic type symptoms on palpation.  Assessment: Well-healing endoscopic plantar fasciotomy left foot.  Neuralgia left foot.  Plan: I expressed to her that more than likely the neuralgia was associated with the injection for the sciatic block.  This should go on to resolve.  I also remove the sutures today and allow her back into her regular shoes however we are going to request that she continue to wear the night boot for the next month.  We will follow-up with her in 1 month

## 2019-08-22 DIAGNOSIS — Z1231 Encounter for screening mammogram for malignant neoplasm of breast: Secondary | ICD-10-CM | POA: Diagnosis not present

## 2019-08-24 ENCOUNTER — Other Ambulatory Visit: Payer: BC Managed Care – PPO

## 2019-08-26 DIAGNOSIS — Z23 Encounter for immunization: Secondary | ICD-10-CM | POA: Diagnosis not present

## 2019-08-26 DIAGNOSIS — F411 Generalized anxiety disorder: Secondary | ICD-10-CM | POA: Diagnosis not present

## 2019-08-26 DIAGNOSIS — I10 Essential (primary) hypertension: Secondary | ICD-10-CM | POA: Diagnosis not present

## 2019-09-07 ENCOUNTER — Ambulatory Visit (INDEPENDENT_AMBULATORY_CARE_PROVIDER_SITE_OTHER): Payer: BC Managed Care – PPO | Admitting: Podiatry

## 2019-09-07 ENCOUNTER — Other Ambulatory Visit: Payer: Self-pay

## 2019-09-07 ENCOUNTER — Ambulatory Visit (INDEPENDENT_AMBULATORY_CARE_PROVIDER_SITE_OTHER): Payer: BC Managed Care – PPO

## 2019-09-07 ENCOUNTER — Encounter: Payer: Self-pay | Admitting: Podiatry

## 2019-09-07 DIAGNOSIS — M722 Plantar fascial fibromatosis: Secondary | ICD-10-CM

## 2019-09-07 DIAGNOSIS — S9032XA Contusion of left foot, initial encounter: Secondary | ICD-10-CM

## 2019-09-07 DIAGNOSIS — Z9889 Other specified postprocedural states: Secondary | ICD-10-CM

## 2019-09-07 NOTE — Progress Notes (Signed)
She presents today for follow-up of her plantar fasciitis left foot states that is doing great until I dropped 2 bottles of shampoo on it.  She states that hurts the top outer side portion of my foot.  Objective: Vital signs are stable she is alert and oriented x3 there is a mild ecchymosis dorsal lateral aspect of the left foot the plantar aspect of the foot appears to be normal and in good standing.  Radiographs taken today do not demonstrate any type of osseous abnormalities.  Assessment: Contusion dorsal lateral aspect of left foot well-healing plantar fasciitis left.  Plan: Follow-up with me in 1 month if necessary.

## 2019-09-28 ENCOUNTER — Ambulatory Visit (INDEPENDENT_AMBULATORY_CARE_PROVIDER_SITE_OTHER): Payer: BC Managed Care – PPO | Admitting: Orthotics

## 2019-09-28 ENCOUNTER — Other Ambulatory Visit: Payer: Self-pay

## 2019-09-28 DIAGNOSIS — Z9889 Other specified postprocedural states: Secondary | ICD-10-CM

## 2019-09-28 DIAGNOSIS — M722 Plantar fascial fibromatosis: Secondary | ICD-10-CM

## 2019-09-28 DIAGNOSIS — S9032XA Contusion of left foot, initial encounter: Secondary | ICD-10-CM

## 2019-09-28 NOTE — Progress Notes (Signed)

## 2019-09-29 ENCOUNTER — Telehealth: Payer: Self-pay | Admitting: *Deleted

## 2019-09-29 NOTE — Telephone Encounter (Signed)
Pt states she is currently working from home after her surgery, but her employer is wanting her to come back to work and she didn't know how Dr. Milinda Pointer felt about that.

## 2019-09-30 ENCOUNTER — Encounter: Payer: Self-pay | Admitting: Podiatry

## 2019-09-30 ENCOUNTER — Ambulatory Visit (INDEPENDENT_AMBULATORY_CARE_PROVIDER_SITE_OTHER): Payer: Self-pay | Admitting: Podiatry

## 2019-09-30 ENCOUNTER — Other Ambulatory Visit: Payer: Self-pay

## 2019-09-30 DIAGNOSIS — Z9889 Other specified postprocedural states: Secondary | ICD-10-CM

## 2019-09-30 NOTE — Progress Notes (Signed)
She presents today states the left foot seems to be doing better but the right foot is still painful.  She states that she is still unable to be on her feet or walk for more than 30 minutes at a time.  Objective: Muscle strength is still limited vascular evaluation is normal.  Still has pain on palpation of the peroneals proximally and distally on the right foot but not as bad as they were in the past.  She has some heel pain on palpation medial calcaneal tubercles bilaterally.  Assessment: Resolving surgery on the left foot right foot appears to be healing very nicely however there is still a lot of residual symptoms and soreness and degenerative disease taking place.  Plan: At this point I think getting her into a set of orthotics to help him increase her ability to walk and stand for longer is necessary.  Until then undergone request that she not work so that will be at least 2 months.

## 2019-10-08 DIAGNOSIS — I10 Essential (primary) hypertension: Secondary | ICD-10-CM | POA: Diagnosis not present

## 2019-10-08 DIAGNOSIS — J3089 Other allergic rhinitis: Secondary | ICD-10-CM | POA: Diagnosis not present

## 2019-10-26 ENCOUNTER — Other Ambulatory Visit: Payer: BC Managed Care – PPO | Admitting: Orthotics

## 2019-10-27 ENCOUNTER — Other Ambulatory Visit: Payer: Self-pay

## 2019-10-27 ENCOUNTER — Ambulatory Visit: Payer: BC Managed Care – PPO | Admitting: Orthotics

## 2019-10-27 DIAGNOSIS — S9032XA Contusion of left foot, initial encounter: Secondary | ICD-10-CM

## 2019-10-27 DIAGNOSIS — Z9889 Other specified postprocedural states: Secondary | ICD-10-CM

## 2019-10-27 DIAGNOSIS — M722 Plantar fascial fibromatosis: Secondary | ICD-10-CM

## 2019-10-27 NOTE — Progress Notes (Signed)
Patient came in today to pick up custom made foot orthotics.  The goals were accomplished and the patient reported no dissatisfaction with said orthotics.  Patient was advised of breakin period and how to report any issues. 

## 2019-11-09 ENCOUNTER — Other Ambulatory Visit: Payer: BC Managed Care – PPO | Admitting: Orthotics

## 2019-11-09 DIAGNOSIS — Z1283 Encounter for screening for malignant neoplasm of skin: Secondary | ICD-10-CM | POA: Diagnosis not present

## 2019-11-09 DIAGNOSIS — Z08 Encounter for follow-up examination after completed treatment for malignant neoplasm: Secondary | ICD-10-CM | POA: Diagnosis not present

## 2019-11-09 DIAGNOSIS — D225 Melanocytic nevi of trunk: Secondary | ICD-10-CM | POA: Diagnosis not present

## 2019-11-09 DIAGNOSIS — L820 Inflamed seborrheic keratosis: Secondary | ICD-10-CM | POA: Diagnosis not present

## 2019-11-09 DIAGNOSIS — D485 Neoplasm of uncertain behavior of skin: Secondary | ICD-10-CM | POA: Diagnosis not present

## 2019-11-09 DIAGNOSIS — Z85820 Personal history of malignant melanoma of skin: Secondary | ICD-10-CM | POA: Diagnosis not present

## 2019-11-30 ENCOUNTER — Ambulatory Visit (INDEPENDENT_AMBULATORY_CARE_PROVIDER_SITE_OTHER): Payer: BC Managed Care – PPO | Admitting: Orthotics

## 2019-11-30 ENCOUNTER — Other Ambulatory Visit: Payer: Self-pay

## 2019-11-30 ENCOUNTER — Encounter: Payer: Self-pay | Admitting: Podiatry

## 2019-11-30 ENCOUNTER — Ambulatory Visit: Payer: BC Managed Care – PPO | Admitting: Podiatry

## 2019-11-30 DIAGNOSIS — Z9889 Other specified postprocedural states: Secondary | ICD-10-CM

## 2019-11-30 DIAGNOSIS — M722 Plantar fascial fibromatosis: Secondary | ICD-10-CM

## 2019-11-30 NOTE — Progress Notes (Signed)
Took material out of arch to make it more flexible; she seemed pleased.

## 2019-11-30 NOTE — Progress Notes (Signed)
She presents today states that she loves her orthotics she feels that the left orthotic is sitting a little high and just Rhonda Porter this morning at the [place] office to have it trimmed down a little bit so is not so hard.  Objective: Vital signs are stable she is alert oriented x3 there is no erythema edema cellulitis drainage or odor.  She has tenderness on palpation of the dorsal lateral aspect of the foot overlying the fourth fifth TMT J.  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  Slowly resolving total endoscopic plantar fasciotomy October 2020.  Plan: Continue the use of the orthotics follow-up with me in 6 weeks

## 2019-12-25 ENCOUNTER — Other Ambulatory Visit: Payer: Self-pay

## 2019-12-25 ENCOUNTER — Encounter: Payer: Self-pay | Admitting: Internal Medicine

## 2019-12-25 ENCOUNTER — Ambulatory Visit: Payer: BC Managed Care – PPO | Admitting: Internal Medicine

## 2019-12-25 VITALS — BP 104/68 | HR 97 | Temp 98.7°F | Ht 61.0 in | Wt 175.0 lb

## 2019-12-25 DIAGNOSIS — R1030 Lower abdominal pain, unspecified: Secondary | ICD-10-CM

## 2019-12-25 DIAGNOSIS — K589 Irritable bowel syndrome without diarrhea: Secondary | ICD-10-CM

## 2019-12-25 MED ORDER — DICYCLOMINE HCL 20 MG PO TABS: 20.0000 mg | ORAL_TABLET | Freq: Three times a day (TID) | ORAL | 4 refills | Status: DC | PRN

## 2019-12-25 NOTE — Progress Notes (Signed)
   Subjective:    Patient ID: Rhonda Porter, female    DOB: Nov 27, 1970, 49 y.o.   MRN: [id number]  HPI Rhonda Porter is a 49 year old female with a history of IBS with intermittent loose stools and abdominal cramping, prior history of diverticular bleeding, history of GERD who is here for follow-up.  She is here alone today.  I saw her last 2 years ago.  She reports that she has been doing well.  She does still have issues with intermittent lower abdominal cramping and pain.  This can be associated with loose stool.  Seems to be very diet dependent.  Her trigger foods tend to be foods higher in fat but also foods like almonds and broccoli.  She is also been avoiding seeds though she has eaten some strawberries recently without problems.  She is taking Florastor 250 mg once a day.  No blood in her stool or melena.  Her reflux has been for the most part well controlled by diet.  Without the need for over-the-counter antacids.  Her brother has recently had problems with complicated diverticulitis and just had a segmental colon resection.   Review of Systems As per HPI, otherwise negative  Current Medications, Allergies, Past Medical History, Past Surgical History, Family History and Social History were reviewed in [employer] record.     Objective:   Physical Exam BP 104/68   Pulse 97   Temp 98.7 F (37.1 C)   Ht 5\' 1"  (1.549 m)   Wt 175 lb (79.4 kg)   BMI 33.07 kg/m  Gen: awake, alert, NAD HEENT: anicteric CV: RRR, no mrg Pulm: CTA b/l Abd: soft, NT/ND, +BS throughout Ext: no c/c/e Neuro: nonfocal      Assessment & Plan:  49 year old female with a history of IBS with intermittent loose stools and abdominal cramping, prior history of diverticular bleeding, history of GERD who is here for follow-up  1. IBS --doing well overall with dietary modification.  She previously had Bentyl which helped when her crampy discomfort was most severe.  I will renew this  prescription.  I do think your story is helping her.  I also think her diverticulosis particular in the left colon exacerbates her irritable bowel type symptoms.  She has not had diverticulitis.  She can likely liberalize fruits containing seeds such as strawberries and kiwi.  I do think she should avoid high fat foods and popcorn.  She is tolerating some nuts though almonds bother her so she will avoid them. --Continue Florastor 250 mg daily --Dietary modifications as above --Bentyl 20 mg 3 times daily as needed when crampy abdominal pain is moderate to severe  2.  GERD --mostly diet-controlled without alarm symptoms.  She could use famotidine 10 to 20 mg twice daily on an as-needed basis  3.  CRC screening --screening colonoscopy recommended at age 67  20 minutes total spent today including patient facing time, coordination of care, reviewing medical history/procedures/pertinent radiology studies, and documentation of the encounter.

## 2019-12-25 NOTE — Patient Instructions (Signed)
We have sent the following medications to your pharmacy for you to pick up at your convenience: Bentyl 20 mg three times daily as needed  Continue Florastor  You will be due for a recall colonoscopy in 04/2021. We will send you a reminder in the mail when it gets closer to that time.  If you are age 49 or older, your body mass index should be between 23-30. Your Body mass index is 33.07 kg/m. If this is out of the aforementioned range listed, please consider follow up with your Primary Care Provider.  If you are age 75 or younger, your body mass index should be between 19-25. Your Body mass index is 33.07 kg/m. If this is out of the aformentioned range listed, please consider follow up with your Primary Care Provider.

## 2020-01-08 ENCOUNTER — Ambulatory Visit: Payer: BC Managed Care – PPO

## 2020-01-23 ENCOUNTER — Ambulatory Visit: Payer: BC Managed Care – PPO

## 2020-03-03 DIAGNOSIS — S70261A Insect bite (nonvenomous), right hip, initial encounter: Secondary | ICD-10-CM | POA: Diagnosis not present

## 2020-03-11 DIAGNOSIS — M545 Low back pain: Secondary | ICD-10-CM | POA: Diagnosis not present

## 2020-03-11 DIAGNOSIS — I10 Essential (primary) hypertension: Secondary | ICD-10-CM | POA: Diagnosis not present

## 2020-03-11 DIAGNOSIS — Z6832 Body mass index (BMI) 32.0-32.9, adult: Secondary | ICD-10-CM | POA: Diagnosis not present

## 2020-03-11 DIAGNOSIS — F411 Generalized anxiety disorder: Secondary | ICD-10-CM | POA: Diagnosis not present

## 2020-05-16 DIAGNOSIS — Z1283 Encounter for screening for malignant neoplasm of skin: Secondary | ICD-10-CM | POA: Diagnosis not present

## 2020-05-16 DIAGNOSIS — B078 Other viral warts: Secondary | ICD-10-CM | POA: Diagnosis not present

## 2020-05-16 DIAGNOSIS — D225 Melanocytic nevi of trunk: Secondary | ICD-10-CM | POA: Diagnosis not present

## 2020-05-16 DIAGNOSIS — L820 Inflamed seborrheic keratosis: Secondary | ICD-10-CM | POA: Diagnosis not present

## 2020-05-16 DIAGNOSIS — D485 Neoplasm of uncertain behavior of skin: Secondary | ICD-10-CM | POA: Diagnosis not present

## 2020-07-06 DIAGNOSIS — Z20828 Contact with and (suspected) exposure to other viral communicable diseases: Secondary | ICD-10-CM | POA: Diagnosis not present

## 2020-08-31 ENCOUNTER — Ambulatory Visit: Payer: BC Managed Care – PPO | Admitting: Podiatry

## 2020-08-31 ENCOUNTER — Encounter: Payer: Self-pay | Admitting: Podiatry

## 2020-08-31 ENCOUNTER — Other Ambulatory Visit: Payer: Self-pay

## 2020-08-31 DIAGNOSIS — M778 Other enthesopathies, not elsewhere classified: Secondary | ICD-10-CM | POA: Diagnosis not present

## 2020-08-31 DIAGNOSIS — M19072 Primary osteoarthritis, left ankle and foot: Secondary | ICD-10-CM

## 2020-08-31 NOTE — Progress Notes (Signed)
She presents today for a chief complaint of pain to the dorsal lateral aspect of the foot.  She states that is got to where it hurts all the time first thing in the morning and then by the end of the day she can hardly walk sometimes.  She states that other times is not as bad.  She tried Voltaren gel which did not help.  She states that she cannot wear her orthotics on a regular basis because it hurts the arch.  Objective: Vital signs are stable alert oriented x3.  Pulses are palpable.  She has pain on palpation medial calcaneal tubercle but almost completely resolved from the EPF.  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  Assessment: Capsulitis of the tarsometatarsal joints left.  More than likely osteoarthritic change.  Plan: Discussed etiology pathology conservative surgical therapies at this point I went ahead and injected 10 mg Kenalog 5 mg Marcaine point of maximal tenderness.  I will follow-up with her in the new year for an MRI if she has not improved.

## 2020-09-03 DIAGNOSIS — Z1231 Encounter for screening mammogram for malignant neoplasm of breast: Secondary | ICD-10-CM | POA: Diagnosis not present

## 2021-01-03 ENCOUNTER — Ambulatory Visit: Payer: Self-pay

## 2021-01-03 ENCOUNTER — Encounter: Payer: Self-pay | Admitting: Orthopaedic Surgery

## 2021-01-03 ENCOUNTER — Other Ambulatory Visit: Payer: Self-pay

## 2021-01-03 ENCOUNTER — Ambulatory Visit: Payer: BC Managed Care – PPO | Admitting: Orthopaedic Surgery

## 2021-01-03 VITALS — Ht 62.0 in | Wt 179.8 lb

## 2021-01-03 DIAGNOSIS — M25552 Pain in left hip: Secondary | ICD-10-CM | POA: Diagnosis not present

## 2021-01-03 NOTE — Addendum Note (Signed)
Addended by: Robyne Peers on: 01/03/2021 12:49 PM   Modules accepted: Orders

## 2021-01-03 NOTE — Progress Notes (Signed)
Office Visit Note   Patient: Rhonda Porter           Date of Birth: December 23, 1970           MRN: [id number] Visit Date: 01/03/2021              Requested by: Curlene Labrum, MD [place],  [place] [address] PCP: Curlene Labrum, MD   Assessment & Plan: Visit Diagnoses:  1. Pain in left hip     Plan: We will send her for MRI of her left hip with and without contrast to evaluate for mass lateral aspect of the hip versus trochanteric bursitis also to evaluate her hip articular cartilage.  Have her back after the MRI to go over results discuss further treatment.  She was shown IT band stretching exercises.  Follow-Up Instructions: Return After MRI.   Orders:  Orders Placed This Encounter  Procedures  . XR HIP UNILAT W OR W/O PELVIS 2-3 VIEWS LEFT   No orders of the defined types were placed in this encounter.     Procedures: No procedures performed   Clinical Data: No additional findings.   Subjective: Chief Complaint  Patient presents with  . Left Hip - Pain    HPI Rhonda Porter is a 50 year old female were seen for the first time for left hip pain is been ongoing for years.  She notes swelling lateral aspect of her left hip.  Pain does awaken her.  She is having no real groin pain.  She had one cortisone injection by her PCP 1 year ago for trochanteric bursitis.  She does feel the Voltaren gel over the area does help.  She has a history of a lower lumbar fusion by Dr. Christella Noa some years ago.  States her back is doing well she denies any numbness tingling down the left leg.  She does have a history of melanoma  Review of Systems See HPI.  Objective: Vital Signs: Ht 5\' 2"  (1.575 m)   Wt 179 lb 12.8 oz (81.6 kg)   BMI 32.89 kg/m   Physical Exam General: Well-developed well-nourished female in no acute distress. Psych: Alert and oriented x3. Ortho Exam Bilateral hips fluid range of motion.  Some discomfort in left hip with extreme external rotation.  Exquisitely  tender over the left trochanteric region.  Minimal tenderness over the right trochanteric region.  No rashes skin lesions ulcerations.  There is definite prominence over the lateral aspect of the left hip which could represent asymmetric adipose.  There is no abnormal warmth over this area or erythema. Specialty Comments:  No specialty comments available.  Imaging: XR HIP UNILAT W OR W/O PELVIS 2-3 VIEWS LEFT  Result Date: 01/03/2021 AP pelvis lateral view of the left hip: Bilateral hips well located.  No acute fractures no bony abnormalities.  Bilateral hips are well located.  Bilateral hip joints are well preserved.    PMFS History: Patient Active Problem List   Diagnosis Date Noted  . Lower abdominal pain 04/11/2017  . Menopausal symptom 04/11/2017  . Pain in pelvis 04/11/2017  . LUQ abdominal pain 02/10/2015  . Bloating 02/08/2015  . Irritable bowel syndrome (IBS) 02/08/2015  . Abdominal cramps 02/08/2015  . DIVERTICULAR BLEEDING, HX OF 01/18/2009  . HYPERTENSION 01/17/2009  . COLONIC POLYPS, HYPERPLASTIC, HX OF 01/17/2009   Past Medical History:  Diagnosis Date  . Diverticula of colon   . Ganglion cyst   . Gastritis   . History of colonic  polyps   . Hypertension    on 03/08/15 pt denies high bp in over 10 yrs   . IBS (irritable bowel syndrome)   . Melanoma ([facility]) 2006    Family History  Problem Relation Age of Onset  . Hypertension Mother   . Leukemia Maternal Grandmother   . Lymphoma Maternal Grandmother     Past Surgical History:  Procedure Laterality Date  . ABDOMINAL HYSTERECTOMY  1999  . CARPAL TUNNEL RELEASE    . KNEE ARTHROSCOPY  2013  . MELANOMA EXCISION  2006  . SPINAL FUSION     Social History   Occupational History  . Occupation: Freight forwarder: [employer]  Tobacco Use  . Smoking status: Current Every Day Smoker    Packs/day: 0.50  . Smokeless tobacco: Never Used  . Tobacco comment: pt declined  Substance and Sexual Activity  .  Alcohol use: No    Alcohol/week: 0.0 standard drinks  . Drug use: No  . Sexual activity: Not on file

## 2021-01-28 ENCOUNTER — Other Ambulatory Visit: Payer: Self-pay

## 2021-01-28 ENCOUNTER — Ambulatory Visit
Admission: RE | Admit: 2021-01-28 | Discharge: 2021-01-28 | Disposition: A | Discharge status: Home / Self Care | Payer: BC Managed Care – PPO | Source: Ambulatory Visit | Attending: Orthopaedic Surgery | Admitting: Orthopaedic Surgery

## 2021-01-28 DIAGNOSIS — M25552 Pain in left hip: Secondary | ICD-10-CM

## 2021-01-28 MED ORDER — GADOBENATE DIMEGLUMINE 529 MG/ML IV SOLN
15.0000 mL | Freq: Once | INTRAVENOUS | Status: AC | PRN
  Administered 2021-01-28: 15 mL via INTRAVENOUS

## 2021-01-29 ENCOUNTER — Other Ambulatory Visit: Payer: BC Managed Care – PPO

## 2021-02-01 ENCOUNTER — Encounter: Payer: Self-pay | Admitting: Orthopaedic Surgery

## 2021-02-01 ENCOUNTER — Other Ambulatory Visit: Payer: Self-pay

## 2021-02-01 ENCOUNTER — Ambulatory Visit: Payer: BC Managed Care – PPO | Admitting: Orthopaedic Surgery

## 2021-02-01 VITALS — Ht 62.0 in | Wt 179.0 lb

## 2021-02-01 DIAGNOSIS — M7062 Trochanteric bursitis, left hip: Secondary | ICD-10-CM

## 2021-02-01 DIAGNOSIS — M25552 Pain in left hip: Secondary | ICD-10-CM

## 2021-02-01 MED ORDER — LIDOCAINE HCL 1 % IJ SOLN
3.0000 mL | INTRAMUSCULAR | Status: AC | PRN
  Administered 2021-02-01: 3 mL

## 2021-02-01 MED ORDER — METHYLPREDNISOLONE ACETATE 40 MG/ML IJ SUSP
40.0000 mg | INTRAMUSCULAR | Status: AC | PRN
  Administered 2021-02-01: 40 mg via INTRA_ARTICULAR

## 2021-02-01 NOTE — Progress Notes (Signed)
   Procedure Note  Patient: Rhonda Porter             Date of Birth: 1971/07/18           MRN: [id number]             Visit Date: 02/01/2021 HPI: Ms. Wetherell returns today for follow-up of her left hip pain.  She underwent an MRI of the left hip.  This showed mild degenerative changes of both hips.  Left hip with trochanteric bursitis mild.  Mild distal gluteus minimus tendinopathy near the greater trochanter.  Also simple appearing ovarian cysts which patient states she has been told she has had past.  She continues to have pain over the lateral aspect of the hip.  There is no evidence of any soft tissue mass as the MRI was performed with and without contrast.  She has been performing stretching exercises.  States she cannot lay on the left hip for any great length of time.  Review of systems: See HPI otherwise negative  Physical exam: Left hip good range of motion extreme external rotation causes pain.  No pain with internal rotation.  Tenderness over the left trochanteric region.  Procedures: Visit Diagnoses:  1. Pain in left hip   2. Trochanteric bursitis, left hip     Large Joint Inj: L greater trochanter on 02/01/2021 5:14 PM Indications: pain Details: 22 G 1.5 in needle, lateral approach  Arthrogram: No  Medications: 3 mL lidocaine 1 %; 40 mg methylPREDNISolone acetate 40 MG/ML Outcome: tolerated well, no immediate complications Procedure, treatment alternatives, risks and benefits explained, specific risks discussed. Consent was given by the patient. Immediately prior to procedure a time out was called to verify the correct patient, procedure, equipment, support staff and site/side marked as required. Patient was prepped and draped in the usual sterile fashion.     Plan: She will continue IT band stretching exercises as shown.  She will follow-up with [place] on an as-needed basis if pain persist or becomes worse.  Questions were encouraged and answered at length.

## 2021-03-17 ENCOUNTER — Telehealth: Payer: Self-pay | Admitting: Internal Medicine

## 2021-03-17 NOTE — Telephone Encounter (Signed)
Called patient back and she states she had one episode this morning of a small amount of blood in the toilet and on the tissue paper. Denies pain or any other symptoms. I asked her to monitor it and let us know If the blood increases or she gets pain.

## 2021-05-10 ENCOUNTER — Other Ambulatory Visit: Payer: Self-pay

## 2021-05-10 ENCOUNTER — Ambulatory Visit (AMBULATORY_SURGERY_CENTER): Payer: BC Managed Care – PPO | Admitting: *Deleted

## 2021-05-10 VITALS — Ht 62.0 in | Wt 179.0 lb

## 2021-05-10 DIAGNOSIS — Z1211 Encounter for screening for malignant neoplasm of colon: Secondary | ICD-10-CM

## 2021-05-10 MED ORDER — PLENVU 140 G PO SOLR: 1.0000 | Freq: Once | ORAL | 0 refills | Status: AC

## 2021-05-10 MED ORDER — ONDANSETRON HCL 4 MG PO TABS: 4.0000 mg | ORAL_TABLET | ORAL | 0 refills | Status: DC

## 2021-05-10 NOTE — Progress Notes (Signed)
Patient's pre-visit was done today over the phone with the patient due to COVID-19 pandemic. Name,DOB and address verified. Insurance verified. Patient denies any allergies to Eggs and Soy. Patient denies any problems with anesthesia/sedation. Patient denies taking diet pills or blood thinners. No home Oxygen. Packet of Prep instructions mailed to patient including a copy of a consent form & prep Coupon-pt is aware. Patient understands to call us back with any questions or concerns. Patient is aware of our care-partner policy and [facility]-11 safety protocol.   EMMI education assigned to the patient for the procedure, sent to [place].   The patient is COVID-19 vaccinated, per patient.

## 2021-05-24 ENCOUNTER — Encounter: Payer: Self-pay | Admitting: Internal Medicine

## 2021-05-24 ENCOUNTER — Other Ambulatory Visit: Payer: Self-pay

## 2021-05-24 ENCOUNTER — Ambulatory Visit (AMBULATORY_SURGERY_CENTER): Payer: BC Managed Care – PPO | Admitting: Internal Medicine

## 2021-05-24 VITALS — BP 108/60 | HR 72 | Temp 98.7°F | Resp 16 | Ht 62.0 in | Wt 179.0 lb

## 2021-05-24 DIAGNOSIS — K635 Polyp of colon: Secondary | ICD-10-CM

## 2021-05-24 DIAGNOSIS — D128 Benign neoplasm of rectum: Secondary | ICD-10-CM

## 2021-05-24 DIAGNOSIS — Z1211 Encounter for screening for malignant neoplasm of colon: Secondary | ICD-10-CM

## 2021-05-24 DIAGNOSIS — K621 Rectal polyp: Secondary | ICD-10-CM

## 2021-05-24 DIAGNOSIS — D127 Benign neoplasm of rectosigmoid junction: Secondary | ICD-10-CM

## 2021-05-24 MED ORDER — SODIUM CHLORIDE 0.9 % IV SOLN: 500.0000 mL | Freq: Once | INTRAVENOUS | Status: DC

## 2021-05-24 NOTE — Progress Notes (Signed)
PT taken to PACU. Monitors in place. VSS. Report given to RN. 

## 2021-05-24 NOTE — Progress Notes (Signed)
VS by [place]  I have reviewed the patient's medical history in detail and updated the computerized patient record.  

## 2021-05-24 NOTE — Patient Instructions (Signed)
Thank for allowing [place] to care for you today!  Final biopsy results available 7-14 days,  Will make recommendations at that time.  Resume previous diet and medications today.  Return to normal daily activities tomorrow, 05/25/2021.   YOU HAD AN ENDOSCOPIC PROCEDURE TODAY AT [facility]:   Refer to the procedure report that was given to you for any specific questions about what was found during the examination.  If the procedure report does not answer your questions, please call your gastroenterologist to clarify.  If you requested that your care partner not be given the details of your procedure findings, then the procedure report has been included in a sealed envelope for you to review at your convenience later.  YOU SHOULD EXPECT: Some feelings of bloating in the abdomen. Passage of more gas than usual.  Walking can help get rid of the air that was put into your GI tract during the procedure and reduce the bloating. If you had a lower endoscopy (such as a colonoscopy or flexible sigmoidoscopy) you may notice spotting of blood in your stool or on the toilet paper. If you underwent a bowel prep for your procedure, you may not have a normal bowel movement for a few days.  Please Note:  You might notice some irritation and congestion in your nose or some drainage.  This is from the oxygen used during your procedure.  There is no need for concern and it should clear up in a day or so.  SYMPTOMS TO REPORT IMMEDIATELY:  Following lower endoscopy (colonoscopy or flexible sigmoidoscopy):  Excessive amounts of blood in the stool  Significant tenderness or worsening of abdominal pains  Swelling of the abdomen that is new, acute  Fever of 100F or higher  For urgent or emergent issues, a gastroenterologist can be reached at any hour by calling [phone]. Do not use MyChart messaging for urgent concerns.    DIET:  We do recommend a small meal at first, but then you may proceed to  your regular diet.  Drink plenty of fluids but you should avoid alcoholic beverages for 24 hours.  ACTIVITY:  You should plan to take it easy for the rest of today and you should NOT DRIVE or use heavy machinery until tomorrow (because of the sedation medicines used during the test).    FOLLOW UP: Our staff will call the number listed on your records 48-72 hours following your procedure to check on you and address any questions or concerns that you may have regarding the information given to you following your procedure. If we do not reach you, we will leave a message.  We will attempt to reach you two times.  During this call, we will ask if you have developed any symptoms of COVID 19. If you develop any symptoms (ie: fever, flu-like symptoms, shortness of breath, cough etc.) before then, please call [phone].  If you test positive for Covid 19 in the 2 weeks post procedure, please call and report this information to [place].    If any biopsies were taken you will be contacted by phone or by letter within the next 1-3 weeks.  Please call us at [phone] if you have not heard about the biopsies in 3 weeks.    SIGNATURES/CONFIDENTIALITY: You and/or your care partner have signed paperwork which will be entered into your electronic medical record.  These signatures attest to the fact that that the information above on your After Visit Summary has been  reviewed and is understood.  Full responsibility of the confidentiality of this discharge information lies with you and/or your care-partner.

## 2021-05-24 NOTE — Progress Notes (Signed)
Called to room to assist during endoscopic procedure.  Patient ID and intended procedure confirmed with present staff. Received instructions for my participation in the procedure from the performing physician.  

## 2021-05-24 NOTE — Op Note (Signed)
[facility] Patient Name: Rhonda Porter Procedure Date: 05/24/2021 8:00 AM MRN: [id number] Endoscopist: Jerene Bears , MD Age: 50 Referring MD:  Date of Birth: 22-Dec-1970 Gender: Female Account #: [id number] Procedure:                Colonoscopy Indications:              Screening for colorectal malignant neoplasm Medicines:                Monitored Anesthesia Care Procedure:                Pre-Anesthesia Assessment:                           - Prior to the procedure, a History and Physical                            was performed, and patient medications and                            allergies were reviewed. The patient's tolerance of                            previous anesthesia was also reviewed. The risks                            and benefits of the procedure and the sedation                            options and risks were discussed with the patient.                            All questions were answered, and informed consent                            was obtained. Prior Anticoagulants: The patient has                            taken no previous anticoagulant or antiplatelet                            agents. ASA Grade Assessment: II - A patient with                            mild systemic disease. After reviewing the risks                            and benefits, the patient was deemed in                            satisfactory condition to undergo the procedure.                           After obtaining informed consent, the colonoscope  was passed under direct vision. Throughout the                            procedure, the patient's blood pressure, pulse, and                            oxygen saturations were monitored continuously. The                            CF [id number] was introduced through the anus                            and advanced to the cecum, identified by                            appendiceal orifice and  ileocecal valve. The                            colonoscopy was performed without difficulty. The                            patient tolerated the procedure well. The quality                            of the bowel preparation was good. The ileocecal                            valve, appendiceal orifice, and rectum were                            photographed. Scope In: 8:30:30 AM Scope Out: 8:47:08 AM Scope Withdrawal Time: 0 hours 11 minutes 58 seconds  Total Procedure Duration: 0 hours 16 minutes 38 seconds  Findings:                 The digital rectal exam was normal.                           A 6 mm polyp was found in the recto-sigmoid colon.                            The polyp was sessile. The polyp was removed with a                            cold snare. Resection and retrieval were complete.                           A 4 mm polyp was found in the distal rectum. The                            polyp was sessile. The polyp was removed with a                            cold snare.  Resection and retrieval were complete.                           Many small and large-mouthed diverticula were found                            in the sigmoid colon, descending colon and splenic                            flexure.                           No additional abnormalities were found on                            retroflexion. Complications:            No immediate complications. Estimated Blood Loss:     Estimated blood loss was minimal. Impression:               - One 6 mm polyp at the recto-sigmoid colon,                            removed with a cold snare. Resected and retrieved.                           - One 4 mm polyp in the distal rectum, removed with                            a cold snare. Resected and retrieved.                           - Severe diverticulosis in the sigmoid colon, in                            the descending colon and at the splenic flexure. Recommendation:            - Patient has a contact number available for                            emergencies. The signs and symptoms of potential                            delayed complications were discussed with the                            patient. Return to normal activities tomorrow.                            Written discharge instructions were provided to the                            patient.                           - Resume previous diet.                           -  Continue present medications.                           - Await pathology results.                           - Repeat colonoscopy is recommended. The                            colonoscopy date will be determined after pathology                            results from today's exam become available for                            review. Jerene Bears, MD 05/24/2021 11:55:38 AM This report has been signed electronically.

## 2021-05-26 ENCOUNTER — Telehealth: Payer: Self-pay

## 2021-05-26 NOTE — Telephone Encounter (Signed)
  Follow up Call-  Call back number 05/24/2021  Post procedure Call Back phone  # [phone]  Permission to leave phone message Yes  Some recent data might be hidden     Patient questions:  Do you have a fever, pain , or abdominal swelling? No. Pain Score  0 *  Have you tolerated food without any problems? Yes.    Have you been able to return to your normal activities? Yes.    Do you have any questions about your discharge instructions: Diet   No. Medications  No. Follow up visit  No.  Do you have questions or concerns about your Care? No.  Actions: * If pain score is 4 or above: No action needed, pain <4.

## 2021-05-29 ENCOUNTER — Telehealth: Payer: Self-pay | Admitting: Internal Medicine

## 2021-05-30 MED ORDER — ONDANSETRON 4 MG PO TBDP: 4.0000 mg | ORAL_TABLET | Freq: Three times a day (TID) | ORAL | 0 refills | Status: DC | PRN

## 2021-05-30 NOTE — Telephone Encounter (Signed)
Patient notified that rx sent in.

## 2021-05-30 NOTE — Telephone Encounter (Signed)
Ok for ondansetron odt 4 mg every 8h PRN nausea

## 2021-05-30 NOTE — Telephone Encounter (Signed)
Patient reports that she has intermittent nausea and is asking for zofran? Has had two episodes over the last few weeks.

## 2021-06-04 ENCOUNTER — Encounter: Payer: Self-pay | Admitting: Internal Medicine

## 2021-08-29 ENCOUNTER — Ambulatory Visit: Payer: BC Managed Care – PPO | Admitting: Podiatry

## 2021-08-30 ENCOUNTER — Ambulatory Visit (INDEPENDENT_AMBULATORY_CARE_PROVIDER_SITE_OTHER): Payer: BC Managed Care – PPO

## 2021-08-30 ENCOUNTER — Ambulatory Visit: Payer: BC Managed Care – PPO | Admitting: Podiatry

## 2021-08-30 ENCOUNTER — Other Ambulatory Visit: Payer: Self-pay

## 2021-08-30 DIAGNOSIS — M19072 Primary osteoarthritis, left ankle and foot: Secondary | ICD-10-CM

## 2021-08-30 DIAGNOSIS — S86312A Strain of muscle(s) and tendon(s) of peroneal muscle group at lower leg level, left leg, initial encounter: Secondary | ICD-10-CM

## 2021-09-01 ENCOUNTER — Ambulatory Visit
Admission: RE | Admit: 2021-09-01 | Discharge: 2021-09-01 | Disposition: A | Discharge status: Home / Self Care | Payer: BC Managed Care – PPO | Source: Ambulatory Visit | Attending: Podiatry | Admitting: Podiatry

## 2021-09-01 ENCOUNTER — Other Ambulatory Visit: Payer: Self-pay

## 2021-09-01 DIAGNOSIS — S86312A Strain of muscle(s) and tendon(s) of peroneal muscle group at lower leg level, left leg, initial encounter: Secondary | ICD-10-CM

## 2021-09-03 NOTE — Progress Notes (Signed)
  Subjective:  Patient ID: Rhonda Porter, female    DOB: June 09, 1971,  MRN: [id number]  Chief Complaint  Patient presents with   Foot Pain    left foot cant put weight on it slightly swollen    50 y.o. female presents with the above complaint. History confirmed with patient.  She can hardly put weight on the left foot.  She previously has had issues on the other side where she had right ankle surgery.  She twisted the foot and ankle and felt a pop  Objective:  Physical Exam: warm, good capillary refill, no trophic changes or ulcerative lesions, normal DP and PT pulses, and normal sensory exam. Left Foot: Very sharp pain on palpation of the peroneal tendons and with resisted eversion along the lateral ankle  Left foot radiographs were taken and there is no osseous abnormality fracture or dislocation Assessment:   1. Peroneal tendon rupture, left, initial encounter   2. Primary osteoarthritis of left foot      Plan:  Patient was evaluated and treated and all questions answered.  She has quite a lot of tenderness along the lateral ankle and considering her history on the other side as well as the inciting event and the feeling of a pop and concern for peroneal tendon tear or rupture.  I am ordering a stat MRI to evaluate this and she will follow-up with Dr. Milinda Pointer in few weeks to review the results.  Recommended immobilization in a cam boot until then and this was dispensed.  I also discussed RICE protocol with her and she will ice and elevate the ankle.  Return in about 5 weeks (around 10/04/2021) for after MRI to review.

## 2021-10-04 ENCOUNTER — Ambulatory Visit: Payer: BC Managed Care – PPO | Admitting: Podiatry

## 2021-10-12 ENCOUNTER — Telehealth: Payer: Self-pay | Admitting: Internal Medicine

## 2021-10-12 NOTE — Telephone Encounter (Signed)
Spoke with patient and let her know recommendations. Explained what a soft, low fiber diet is and what patient could eat on this diet. Pt knows to let us know if she has additional blood and to go to the ED if she continues to pass red blood or clots. Patient verbalized understanding and had no other questions at end of call.

## 2021-10-12 NOTE — Telephone Encounter (Signed)
What she is referring to is a previous diverticular hemorrhage Unfortunately, there is no way to prevent diverticular bleeding.  At this point I would recommend that she observe, if she continues to pass red blood or clots then she will need to come to the emergency department Blood will behave like a laxative in the colon and so if she has bleeding she will know it Soft, low fiber diet for a few days and observation She should let us know if she has additional blood per rectum

## 2021-10-12 NOTE — Telephone Encounter (Signed)
Pt states she felt like she had to have a BM this morning, she went to the bathroom and only passed a small amount of stool but quite a bit of BRB. She has been back to the bathroom 2 more times and passed some stool but a minimal amount of blood that was more pale in color. Pt reports she was in the hospital 10 years ago with a tear in her colon. She is supposed to go to [facility] and is concerned. Reports she is not constipated and she takes her probiotic daily. Please advise.

## 2021-10-12 NOTE — Telephone Encounter (Signed)
Patient called states when she woke up and went to the bathroom she was passing blood very concerned seeking advise.

## 2021-11-02 ENCOUNTER — Other Ambulatory Visit: Payer: Self-pay

## 2021-11-02 ENCOUNTER — Encounter: Payer: Self-pay | Admitting: Podiatry

## 2021-11-02 ENCOUNTER — Ambulatory Visit: Payer: BC Managed Care – PPO | Admitting: Podiatry

## 2021-11-02 DIAGNOSIS — M7672 Peroneal tendinitis, left leg: Secondary | ICD-10-CM | POA: Diagnosis not present

## 2021-11-02 MED ORDER — METHYLPREDNISOLONE 4 MG PO TBPK: ORAL_TABLET | ORAL | 0 refills | Status: DC

## 2021-11-02 NOTE — Progress Notes (Signed)
She presents today after having not seen her for quite some time after her MRI results.  She states that she is doing better now because she wore the boot for a while but is still tender on the plantar and plantar lateral aspect but around the peroneal tendons is doing much better.  Objective: Vital signs are stable alert orient times that she has no pain on palpation of the peroneal tendons that she does have tenderness on palpation fifth metatarsal base of the left foot.  She still has some tenderness on palpation medial calcaneal tubercle of the left heel.  Assessment: Planter fasciitis possible lateral compensatory syndrome even peroneal tendinitis.  MRI was negative essentially for peroneal tears.  Plan: Started her on methylprednisolone she did not want an injection I will follow-up with her as needed

## 2021-12-14 ENCOUNTER — Other Ambulatory Visit: Payer: Self-pay | Admitting: Internal Medicine

## 2022-02-26 ENCOUNTER — Ambulatory Visit: Payer: BC Managed Care – PPO | Admitting: Orthopaedic Surgery

## 2022-02-26 ENCOUNTER — Encounter: Payer: Self-pay | Admitting: Orthopaedic Surgery

## 2022-02-26 DIAGNOSIS — M25552 Pain in left hip: Secondary | ICD-10-CM

## 2022-02-26 DIAGNOSIS — M7062 Trochanteric bursitis, left hip: Secondary | ICD-10-CM

## 2022-02-26 MED ORDER — METHYLPREDNISOLONE ACETATE 40 MG/ML IJ SUSP
40.0000 mg | INTRAMUSCULAR | Status: AC | PRN
  Administered 2022-02-26: 40 mg via INTRA_ARTICULAR

## 2022-02-26 MED ORDER — LIDOCAINE HCL 1 % IJ SOLN
3.0000 mL | INTRAMUSCULAR | Status: AC | PRN
  Administered 2022-02-26: 3 mL

## 2022-02-26 NOTE — Progress Notes (Signed)
? ?Office Visit Note ?  ?Patient: Rhonda Porter           ?Date of Birth: 07/13/1971           ?MRN: [id number] ?Visit Date: 02/26/2022 ?             ?Requested by: Lanelle Bal, PA-C ?[facility] ?[place],  [place] [id number] ?PCP: Lanelle Bal, PA-C ? ? ?Assessment & Plan: ?Visit Diagnoses:  ?1. Pain in left hip   ?2. Trochanteric bursitis, left hip   ? ? ?Plan: Per the patient's request I did provide a steroid injection over her left hip trochanteric area which she agreed to and tolerated well.  She will continue to work on stretching exercises and not lay on that side.  Follow-up is as needed. ? ?Follow-Up Instructions: Return if symptoms worsen or fail to improve.  ? ?Orders:  ?Orders Placed This Encounter  ?Procedures  ? Large Joint Inj  ? ?No orders of the defined types were placed in this encounter. ? ? ? ? Procedures: ?Large Joint Inj: L greater trochanter on 02/26/2022 3:49 PM ?Indications: pain and diagnostic evaluation ?Details: 22 G 1.5 in needle, lateral approach ? ?Arthrogram: No ? ?Medications: 3 mL lidocaine 1 %; 40 mg methylPREDNISolone acetate 40 MG/ML ?Outcome: tolerated well, no immediate complications ?Procedure, treatment alternatives, risks and benefits explained, specific risks discussed. Consent was given by the patient. Immediately prior to procedure a time out was called to verify the correct patient, procedure, equipment, support staff and site/side marked as required. Patient was prepped and draped in the usual sterile fashion.  ? ? ? ? ?Clinical Data: ?No additional findings. ? ? ?Subjective: ?Chief Complaint  ?Patient presents with  ? Left Hip - Follow-up  ?The patient comes in today requesting steroid injection of her left hip trochanteric area.  We last injected her hip in early April 2022.  This is for the same thing.  We have had an MRI of her left hip in the past showing just mild arthritic changes and some mild hip bursitis.  There is also some gluteus medius tendinitis but no tear.  This  is really flared up over the last few months and she feels like an injection would help her again.  She has had no acute change in her medical status otherwise. ? ?HPI ? ?Review of Systems ?There is no listed fever, chills, nausea, vomiting ? ?Objective: ?Vital Signs: There were no vitals taken for this visit. ? ?Physical Exam ?She is alert and oriented x3 and in no acute distress ?Ortho Exam ?Examination of her left hip shows it moves smoothly and fluidly.  She has significant pain to barely touching her left hip trochanteric area and IT band proximally. ?Specialty Comments:  ?No specialty comments available. ? ?Imaging: ?No results found. ? ? ?PMFS History: ?Patient Active Problem List  ? Diagnosis Date Noted  ? Lower abdominal pain 04/11/2017  ? Menopausal symptom 04/11/2017  ? Pain in pelvis 04/11/2017  ? LUQ abdominal pain 02/10/2015  ? Bloating 02/08/2015  ? Irritable bowel syndrome (IBS) 02/08/2015  ? Abdominal cramps 02/08/2015  ? DIVERTICULAR BLEEDING, HX OF 01/18/2009  ? HYPERTENSION 01/17/2009  ? COLONIC POLYPS, HYPERPLASTIC, HX OF 01/17/2009  ? ?Past Medical History:  ?Diagnosis Date  ? Allergy   ? Diverticula of colon   ? Ganglion cyst   ? Gastritis   ? History of colonic polyps   ? Hypertension   ? on 03/08/15 pt denies high bp in  over 10 yrs   ? IBS (irritable bowel syndrome)   ? Melanoma ([facility]) 10/29/2004  ?  ?Family History  ?Problem Relation Age of Onset  ? Colon polyps Mother   ? Hypertension Mother   ? Leukemia Maternal Grandmother   ? Lymphoma Maternal Grandmother   ? Colon cancer Neg Hx   ? Esophageal cancer Neg Hx   ? Rectal cancer Neg Hx   ? Stomach cancer Neg Hx   ?  ?Past Surgical History:  ?Procedure Laterality Date  ? ABDOMINAL HYSTERECTOMY  10/29/1997  ? CARPAL TUNNEL RELEASE    ? COLONOSCOPY  03/08/2015  ? KNEE ARTHROSCOPY  10/30/2011  ? MELANOMA EXCISION  10/29/2004  ? SPINAL FUSION    ? UPPER GASTROINTESTINAL ENDOSCOPY  03/08/2015  ? Dr.Pyrtle  ? ?Social History  ? ?Occupational History   ? Occupation: Data Entry  ?  Employer: [employer]  ?Tobacco Use  ? Smoking status: Every Day  ?  Packs/day: 0.50  ?  Types: Cigarettes  ? Smokeless tobacco: Never  ? Tobacco comments:  ?  pt declined  ?Vaping Use  ? Vaping Use: Never used  ?Substance and Sexual Activity  ? Alcohol use: Yes  ?  Comment: once a month per pt  ? Drug use: No  ? Sexual activity: Not on file  ? ? ? ? ? ? ?

## 2022-04-16 ENCOUNTER — Encounter: Payer: Self-pay | Admitting: Physician Assistant

## 2022-04-16 ENCOUNTER — Ambulatory Visit: Payer: BC Managed Care – PPO | Admitting: Physician Assistant

## 2022-04-16 DIAGNOSIS — M7062 Trochanteric bursitis, left hip: Secondary | ICD-10-CM

## 2022-04-16 MED ORDER — METHYLPREDNISOLONE ACETATE 40 MG/ML IJ SUSP
40.0000 mg | INTRAMUSCULAR | Status: AC | PRN
  Administered 2022-04-16: 40 mg via INTRA_ARTICULAR

## 2022-04-16 MED ORDER — LIDOCAINE HCL 1 % IJ SOLN
3.0000 mL | INTRAMUSCULAR | Status: AC | PRN
  Administered 2022-04-16: 3 mL

## 2022-04-16 NOTE — Progress Notes (Signed)
   Procedure Note  Patient: Rhonda Porter             Date of Birth: [birth year]           MRN: [id number]             Visit Date: 04/16/2022  HPI: Mrs. Rhonda Porter returns today due to left hip pain.  She was last seen on May 1 this year and was given a trochanteric injection.  She states the last injection really did not help.  She is having trouble walking on the left hip.  She has mainly pain in the lateral aspect of the hip but now some groin pain.  MRI last year showed mild arthritic changes and mild left trochanteric bursitis with mild distal gluteus minimus tendinopathy.  She denies any injury to the hip.  Review of systems: See HPI.  Physical exam: Left hip fluid range of motion.  Internal rotation however causes some discomfort.  She is maximally tender over the trochanteric region left hip.  Right hip excellent range of motion without pain nontender over the trochanteric region.  Procedures: Visit Diagnoses:  1. Trochanteric bursitis, left hip     Large Joint Inj: L greater trochanter on 04/16/2022 2:00 PM Indications: pain Details: 22 G 1.5 in needle, lateral approach  Arthrogram: No  Medications: 3 mL lidocaine 1 %; 40 mg methylPREDNISolone acetate 40 MG/ML Outcome: tolerated well, no immediate complications Procedure, treatment alternatives, risks and benefits explained, specific risks discussed. Consent was given by the patient. Immediately prior to procedure a time out was called to verify the correct patient, procedure, equipment, support staff and site/side marked as required. Patient was prepped and draped in the usual sterile fashion.     Plan: She will do IT band stretching as shown.  I told her that if this does not help with pain recommend repeat MRI of her left hip to evaluate for cartilage loss and also reevaluate her trochanteric bursitis in the gluteus tendinopathy.  She will let us know if her pain does not improve.  Questions were encouraged and answered at  length.

## 2022-04-24 ENCOUNTER — Telehealth: Payer: Self-pay | Admitting: Physician Assistant

## 2022-04-24 DIAGNOSIS — M25552 Pain in left hip: Secondary | ICD-10-CM

## 2022-04-24 NOTE — Telephone Encounter (Signed)
Called pt and advised. She stated understanding  

## 2022-05-04 ENCOUNTER — Ambulatory Visit
Admission: RE | Admit: 2022-05-04 | Discharge: 2022-05-04 | Disposition: A | Discharge status: Home / Self Care | Payer: BC Managed Care – PPO | Source: Ambulatory Visit | Attending: Orthopaedic Surgery | Admitting: Orthopaedic Surgery

## 2022-05-04 DIAGNOSIS — M25552 Pain in left hip: Secondary | ICD-10-CM

## 2022-05-14 ENCOUNTER — Ambulatory Visit: Payer: BC Managed Care – PPO | Admitting: Orthopaedic Surgery

## 2022-05-14 ENCOUNTER — Encounter: Payer: Self-pay | Admitting: Orthopaedic Surgery

## 2022-05-14 DIAGNOSIS — M25552 Pain in left hip: Secondary | ICD-10-CM | POA: Diagnosis not present

## 2022-05-14 NOTE — Progress Notes (Signed)
The patient comes in today to go over a MRI of her left hip.  She is an active 51 year old female has been dealing with chronic left hip pain for some time now.  Its always been consistent over the lateral aspect of her left hip.  She has tried and failed conservative treatment with multiple injections as well as activity modification and oral as well as topical anti-inflammatories.  She has not had any pain in the groin at all.  On my exam today her left hip moves smoothly and fluidly with no blocks to rotation and no pain in the groin.  She is very tender over the trochanteric area of the left hip.  The MRI of the left hip shows her cartilage and hip joint itself are intact.  She does have tendinosis of the gluteus minimus tendon and partial tearing of the gluteus medius tendon.  At this point I would like to send her to my partner Dr. Arsenio Loader to further assess her left hip given the MRI findings.  She agrees with this referral as well.

## 2022-05-24 ENCOUNTER — Ambulatory Visit (HOSPITAL_BASED_OUTPATIENT_CLINIC_OR_DEPARTMENT_OTHER): Payer: BC Managed Care – PPO | Admitting: Orthopaedic Surgery

## 2022-05-24 DIAGNOSIS — S76012A Strain of muscle, fascia and tendon of left hip, initial encounter: Secondary | ICD-10-CM | POA: Diagnosis not present

## 2022-05-24 DIAGNOSIS — M25552 Pain in left hip: Secondary | ICD-10-CM | POA: Diagnosis not present

## 2022-05-24 MED ORDER — TRIAMCINOLONE ACETONIDE 40 MG/ML IJ SUSP
80.0000 mg | INTRAMUSCULAR | Status: AC | PRN
  Administered 2022-05-24: 80 mg via INTRA_ARTICULAR

## 2022-05-24 MED ORDER — LIDOCAINE HCL 1 % IJ SOLN
4.0000 mL | INTRAMUSCULAR | Status: AC | PRN
  Administered 2022-05-24: 4 mL

## 2022-05-24 NOTE — Progress Notes (Signed)
Chief Complaint: Referral for left gluteus medius     History of Present Illness:    Rhonda Porter is a 51 y.o. female presents today as a referral from Dr. Ninfa Linden for several years of left hip pain.  He states that she is seeing seeing pain with laying directly on the left hip.  This time she is disabled and many activities of daily living.  She states that she cannot sit or stand for too long.  She has a 80-monthold grandson which she has a very difficult time playing with.  She states that this awakens her at night.  She has difficulty going up and down stairs.  She is active in her yard.  [place]  This is all been completely limited as a result of her hip pain.  She does feel like the gait is unsteady and off balance.  She has taken ibuprofen and Voltaren gel for this.  She states that she feels significantly weak on the left side.  She is here today for further discussion.  She has previously had 2 injections into the greater trochanteric region initially getting significant relief but most recently only several days of relief.    Surgical History:   None  PMH/PSH/Family History/Social History/Meds/Allergies:    Past Medical History:  Diagnosis Date  . Allergy   . Diverticula of colon   . Ganglion cyst   . Gastritis   . History of colonic polyps   . Hypertension    on 03/08/15 pt denies high bp in over 10 yrs   . IBS (irritable bowel syndrome)   . [place]   Past Surgical History:  Procedure Laterality Date  . ABDOMINAL HYSTERECTOMY  10/29/1997  . CARPAL TUNNEL RELEASE    . COLONOSCOPY  03/08/2015  . KNEE ARTHROSCOPY  10/30/2011  . MELANOMA EXCISION  10/29/2004  . SPINAL FUSION    . UPPER GASTROINTESTINAL ENDOSCOPY  03/08/2015   Dr.Pyrtle   Social History   Socioeconomic History  . Marital status: Married    Spouse name: Not on file   . Number of children: 2  . Years of education: Not on file  . Highest education level: Not on file  Occupational History  . 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 Smoking status: Every Day    Packs/day: 0.50    Types: Cigarettes  . Smokeless tobacco: Never  . Tobacco comments:    pt declined  Vaping Use  . Vaping Use: Never used  Substance and Sexual Activity  . Alcohol use: Yes    Comment: once a month per pt  . Drug use: No  . Sexual activity: Not on file  Other Topics Concern  . Not on file  Social History Narrative  . Not on file   Social Determinants of Health   Financial Resource Strain: Not on file  Food Insecurity: Not on file  Transportation Needs: Not on file  Physical Activity: Not on file  Stress: Not on file  Social Connections: Not on file   Family History  Problem Relation Age of Onset  . Colon polyps Mother   . Hypertension Mother   . Leukemia Maternal Grandmother   .  Lymphoma Maternal Grandmother   . Colon cancer Neg Hx   . Esophageal cancer Neg Hx   . Rectal cancer Neg Hx   . Stomach cancer Neg Hx    Allergies  Allergen Reactions  . Cephalexin Other (See Comments)  . Cetirizine Hcl Other (See Comments)  . Hydrocodone     Have hypersensitivity to most pain meds  . Other   . Penicillins   . Sulfa Antibiotics    Current Outpatient Medications  Medication Sig Dispense Refill  . Ascorbic Acid (VITAMIN C PO) Take by mouth.    . cyclobenzaprine (FLEXERIL) 10 MG tablet Take 10 mg by mouth at bedtime.    . dicyclomine (BENTYL) 20 MG tablet TAKE (1) TABLET BY MOUTH (3) TIMES DAILY. 90 tablet 3  . ELDERBERRY PO Take by mouth.    . fexofenadine (ALLEGRA) 180 MG tablet Take 180 mg by mouth daily.    . fluticasone (FLONASE) 50 MCG/ACT nasal spray Place into both nostrils daily.    . hydrochlorothiazide (MICROZIDE) 12.5 MG capsule   1  . losartan (COZAAR) 100 MG tablet Take 100 mg by mouth daily.    .  methylPREDNISolone (MEDROL DOSEPAK) 4 MG TBPK tablet 6 day dose pack - take as directed 21 tablet 0  . montelukast (SINGULAIR) 10 MG tablet Take 10 mg by mouth as needed.     . ondansetron (ZOFRAN ODT) 4 MG disintegrating tablet Take 1 tablet (4 mg total) by mouth every 8 (eight) hours as needed for nausea or vomiting. 20 tablet 0  . VITAMIN D PO Take by mouth.    Marland Kitchen VITAMIN E PO Take by mouth.     No current facility-administered medications for this visit.   No results found.  Review of Systems:   A ROS was performed including pertinent positives and negatives as documented in the HPI.  Physical Exam :   Constitutional: NAD and appears stated age Neurological: Alert and oriented Psych: Appropriate affect and cooperative There were no vitals taken for this visit.   Comprehensive Musculoskeletal Exam:    Inspection Right Left  Skin No atrophy or gross abnormalities appreciated No atrophy or gross abnormalities appreciated  Palpation    Tenderness None None  Crepitus None None  Range of Motion    Flexion (passive) 120 120  Extension 30 30  IR 30 30  ER 45 45 with pain referred to lateral trochanter  Strength    Flexion  5/5 5/5  Extension 5/5 5/5  Special Tests    FABER Negative Negative  FADIR Negative Negative  ER Lag/Capsular Insufficiency Negative Negative  Instability Negative Negative  Sacroiliac pain Negative  Negative   Instability    Generalized Laxity No No  Neurologic    sciatic, femoral, obturator nerves intact to light sensation  Vascular/Lymphatic    DP pulse 2+ 2+  Lumbar Exam    Patient has symmetric lumbar range of motion with negative pain referral to hip    Positive Trendelenburg sign with standing on the left side.  There is weakness with left hip abduction  Imaging:   Xray (2 views left hip with AP pelvis): Normal  MRI (pelvis): There is left worse than right tearing of the gluteus minimus and a near full-thickness tear of the gluteus medius  on the left overall well-appearing muscle belly.  I personally reviewed and interpreted the radiographs.   Assessment:   51 y.o. female presents with left hip symptoms consistent with gluteus medius and minimus tearing.  I did describe that her MRI does show evidence of a nearly complete tear.  That being said the muscle belly appears to be quite healthy.  Her symptoms are very consistent with this.  At this time we did discuss operative versus nonoperative management.  I did describe that there may be a role for physical therapy to see if she can compensate with her intact muscle fibers to strengthen of the left hip and hopefully to potentially avoid surgery.  To this effect I recommended ultrasound-guided injection to help optimize physical therapy and to improve her ability to work through this.  We did discuss that I will plan to see her back in 6 weeks and if she is not improving from this standpoint then we will strongly consider operative repair as her symptoms are quite disabling at this time.  Plan :    -Physical therapy order -Plan for ultrasound-guided injection to the left gluteus -Return to clinic in 6 weeks    Procedure Note  Patient: Rhonda Porter             Date of Birth: January 24, 1971           MRN: [id number]             Visit Date: 05/24/2022  Procedures: Visit Diagnoses:  1. Tear of left gluteus medius tendon, initial encounter     Large Joint Inj: L greater trochanter on 05/24/2022 11:59 AM Indications: pain Details: 22 G 3.5 in needle, ultrasound-guided anterolateral approach  Arthrogram: No  Medications: 4 mL lidocaine 1 %; 80 mg triamcinolone acetonide 40 MG/ML Outcome: tolerated well, no immediate complications Procedure, treatment alternatives, risks and benefits explained, specific risks discussed. Consent was given by the patient. Immediately prior to procedure a time out was called to verify the correct patient, procedure, equipment, support staff and  site/side marked as required. Patient was prepped and draped in the usual sterile fashion.          I personally saw and evaluated the patient, and participated in the management and treatment plan.  Vanetta Mulders, MD Attending Physician, Orthopedic Surgery  This document was dictated using Dragon voice recognition software. A reasonable attempt at proof reading has been made to minimize errors.

## 2022-06-18 ENCOUNTER — Ambulatory Visit (HOSPITAL_BASED_OUTPATIENT_CLINIC_OR_DEPARTMENT_OTHER): Payer: BC Managed Care – PPO | Admitting: Physical Therapy

## 2022-06-25 ENCOUNTER — Ambulatory Visit (HOSPITAL_BASED_OUTPATIENT_CLINIC_OR_DEPARTMENT_OTHER): Payer: BC Managed Care – PPO | Admitting: Orthopaedic Surgery

## 2022-06-25 DIAGNOSIS — S76012A Strain of muscle, fascia and tendon of left hip, initial encounter: Secondary | ICD-10-CM

## 2022-06-25 NOTE — Progress Notes (Signed)
Chief Complaint: Referral for left gluteus medius     History of Present Illness:    06/25/2022: Today for follow-up of her left hip.  She did get a few days of relief from her last injection although this is subsequently worn off.  She has begun physical therapy and has also started working on a program for strengthening of the left hip.  This has been going on for the last month.  Despite that she continues to have pain laying on that side as well as going up and down stairs.  She is quite frustrated about her activity level and ability to play with her grandchildren.  Rhonda Porter is a 51 y.o. female presents today as a referral from Dr. Ninfa Linden for several years of left hip pain.  He states that she is seeing seeing pain with laying directly on the left hip.  This time she is disabled and many activities of daily living.  She states that she cannot sit or stand for too long.  She has a 67-monthold grandson which she has a very difficult time playing with.  She states that this awakens her at night.  She has difficulty going up and down stairs.  She is active in her yard.  [place]  This is all been completely limited as a result of her hip pain.  She does feel like the gait is unsteady and off balance.  She has taken ibuprofen and Voltaren gel for this.  She states that she feels significantly weak on the left side.  She is here today for further discussion.  She has previously had 2 injections into the greater trochanteric region initially getting significant relief but most recently only several days of relief.    Surgical History:   None  PMH/PSH/Family History/Social History/Meds/Allergies:    Past Medical History:  Diagnosis Date   Allergy    Diverticula of colon    Ganglion cyst    Gastritis    History of colonic polyps    Hypertension    on 03/08/15 pt denies  high bp in over 10 yrs    IBS (irritable bowel syndrome)    [place]   Past Surgical History:  Procedure Laterality Date   ABDOMINAL HYSTERECTOMY  10/29/1997   CARPAL TUNNEL RELEASE     COLONOSCOPY  03/08/2015   KNEE ARTHROSCOPY  10/30/2011   MELANOMA EXCISION  10/29/2004   SPINAL FUSION     UPPER GASTROINTESTINAL ENDOSCOPY  03/08/2015   Dr.Pyrtle   Social History   Socioeconomic History   Marital status: Married    Spouse name: Not on file   Number of children: 2   Years of education: Not on file   Highest education level: Not on file  Occupational History   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 [employer]  Tobacco Use   Smoking status: Every Day    Packs/day: 0.50    Types: Cigarettes   Smokeless tobacco: Never   Tobacco comments:    pt declined  Vaping Use   Vaping Use: Never used  Substance and Sexual Activity   Alcohol use: Yes    Comment: once a month per pt   Drug use: No  Sexual activity: Not on file  Other Topics Concern   Not on file  Social History Narrative   Not on file   Social Determinants of Health   Financial Resource Strain: Not on file  Food Insecurity: Not on file  Transportation Needs: Not on file  Physical Activity: Not on file  Stress: Not on file  Social Connections: Not on file   Family History  Problem Relation Age of Onset   Colon polyps Mother    Hypertension Mother    Leukemia Maternal Grandmother    Lymphoma Maternal Grandmother    Colon cancer Neg Hx    Esophageal cancer Neg Hx    Rectal cancer Neg Hx    Stomach cancer Neg Hx    Allergies  Allergen Reactions   Cephalexin Other (See Comments)   Cetirizine Hcl Other (See Comments)   Hydrocodone     Have hypersensitivity to most pain meds   Other    Penicillins    Sulfa Antibiotics    Current Outpatient Medications  Medication Sig Dispense Refill   Ascorbic Acid (VITAMIN C PO) Take by mouth.     cyclobenzaprine (FLEXERIL) 10 MG tablet Take  10 mg by mouth at bedtime.     dicyclomine (BENTYL) 20 MG tablet TAKE (1) TABLET BY MOUTH (3) TIMES DAILY. 90 tablet 3   ELDERBERRY PO Take by mouth.     fexofenadine (ALLEGRA) 180 MG tablet Take 180 mg by mouth daily.     fluticasone (FLONASE) 50 MCG/ACT nasal spray Place into both nostrils daily.     hydrochlorothiazide (MICROZIDE) 12.5 MG capsule   1   losartan (COZAAR) 100 MG tablet Take 100 mg by mouth daily.     methylPREDNISolone (MEDROL DOSEPAK) 4 MG TBPK tablet 6 day dose pack - take as directed 21 tablet 0   montelukast (SINGULAIR) 10 MG tablet Take 10 mg by mouth as needed.      ondansetron (ZOFRAN ODT) 4 MG disintegrating tablet Take 1 tablet (4 mg total) by mouth every 8 (eight) hours as needed for nausea or vomiting. 20 tablet 0   VITAMIN D PO Take by mouth.     VITAMIN E PO Take by mouth.     No current facility-administered medications for this visit.   No results found.  Review of Systems:   A ROS was performed including pertinent positives and negatives as documented in the HPI.  Physical Exam :   Constitutional: NAD and appears stated age Neurological: Alert and oriented Psych: Appropriate affect and cooperative There were no vitals taken for this visit.   Comprehensive Musculoskeletal Exam:    Inspection Right Left  Skin No atrophy or gross abnormalities appreciated No atrophy or gross abnormalities appreciated  Palpation    Tenderness None None  Crepitus None None  Range of Motion    Flexion (passive) 120 120  Extension 30 30  IR 30 30  ER 45 45 with pain referred to lateral trochanter  Strength    Flexion  5/5 5/5  Extension 5/5 5/5  Special Tests    FABER Negative Negative  FADIR Negative Negative  ER Lag/Capsular Insufficiency Negative Negative  Instability Negative Negative  Sacroiliac pain Negative  Negative   Instability    Generalized Laxity No No  Neurologic    sciatic, femoral, obturator nerves intact to light sensation   Vascular/Lymphatic    DP pulse 2+ 2+  Lumbar Exam    Patient has symmetric lumbar range of motion with negative pain  referral to hip    Positive Trendelenburg sign with standing on the left side.  There is weakness with left hip abduction  Imaging:   Xray (2 views left hip with AP pelvis): Normal  MRI (pelvis): There is left worse than right tearing of the gluteus minimus and a near full-thickness tear of the gluteus medius on the left overall well-appearing muscle belly.  I personally reviewed and interpreted the radiographs.   Assessment:   51 y.o. female presents with left hip symptoms consistent with gluteus medius and minimus tearing.  I did describe that her MRI does show evidence of a nearly complete tear.  That being said the muscle belly appears to be quite healthy.  Her symptoms are very consistent with this.  At this time we did discuss operative versus nonoperative management.  At this time she has begun a home program as well as initiate physical therapy without significant gain.  I would like her to continue to work on this in hopes of hopefully getting her some relief.  Given the fact that she has not had 3 injections as well with continued limited relief she has wished to discuss surgical intervention.  We did discuss gluteus medius repair as well as what that would entail with rehab.  She is leaning towards this and would likely like to proceed should she not get any relief from continued physical therapy.  She is deferring additional injections at this time.  Plan :    -Continue physical therapy left hip -We will consider surgical intervention on the left hip if no improvement with this      I personally saw and evaluated the patient, and participated in the management and treatment plan.  Vanetta Mulders, MD Attending Physician, Orthopedic Surgery  This document was dictated using Dragon voice recognition software. A reasonable attempt at proof reading has been  made to minimize errors.

## 2022-06-27 NOTE — Therapy (Signed)
OUTPATIENT PHYSICAL THERAPY LOWER EXTREMITY EVALUATION   Patient Name: Rhonda Porter MRN: [id number] DOB:[birth year], 51 y.o., female Today's Date: 06/28/2022   PT End of Session - 06/28/22 1105     Visit Number 1    Number of Visits 8    Date for PT Re-Evaluation 09/26/22    Authorization Type BCBS    PT Start Time 1100    PT Stop Time 1145    PT Time Calculation (min) 45 min    Activity Tolerance Patient tolerated treatment well;Patient limited by pain    Behavior During Therapy [facility] for tasks assessed/performed             Past Medical History:  Diagnosis Date   Allergy    Diverticula of colon    Ganglion cyst    Gastritis    History of colonic polyps    Hypertension    on 03/08/15 pt denies high bp in over 10 yrs    IBS (irritable bowel syndrome)    Melanoma ([place]) 10/29/2004   Past Surgical History:  Procedure Laterality Date   ABDOMINAL HYSTERECTOMY  10/29/1997   CARPAL TUNNEL RELEASE     COLONOSCOPY  03/08/2015   KNEE ARTHROSCOPY  10/30/2011   MELANOMA EXCISION  10/29/2004   SPINAL FUSION     UPPER GASTROINTESTINAL ENDOSCOPY  03/08/2015   Dr.Pyrtle   Patient Active Problem List   Diagnosis Date Noted   Lower abdominal pain 04/11/2017   Menopausal symptom 04/11/2017   Pain in pelvis 04/11/2017   LUQ abdominal pain 02/10/2015   Bloating 02/08/2015   Irritable bowel syndrome (IBS) 02/08/2015   Abdominal cramps 02/08/2015   DIVERTICULAR BLEEDING, HX OF 01/18/2009   HYPERTENSION 01/17/2009   COLONIC POLYPS, HYPERPLASTIC, HX OF 01/17/2009    PCP: Lanelle Bal, PA-C  REFERRING PROVIDER: Vanetta Mulders, MD  REFERRING DIAG: [phone] (ICD-10-CM) - Tear of left gluteus medius tendon, initial encounter  THERAPY DIAG:  Pain in left hip - Plan: PT plan of care cert/re-cert  Difficulty walking - Plan: PT plan of care cert/re-cert  Muscle weakness (generalized) - Plan: PT plan of care cert/re-cert  Rationale for Evaluation and Treatment  Rehabilitation  ONSET DATE: 2010  SUBJECTIVE:   SUBJECTIVE STATEMENT: 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 Pt states the hip has been hurting off and on since that time. Pt initially thought it was bursitis. Pt states she has had a limp on the L side for several years now. She has had 3 steroid injections into the hip at this time that have offered very little relief. Pt used to ride a stationary bike and walking for exercise. She is no long able to exercise due to pain. Pt has had issues with carrying her grandson due to the pain. Pt is a R side sleeper and the pain will wake her up at night despite using a pillow between knees. Pt states end of day pain is worse. Hip is stiff first thing in the morning. Pt is limited to <1 hrs with any activity due to pain. Pt denies LOB or falls due to hip. Pt denies cancer red flags. Pt denies NT outside of foot nerve damage to R foot.   PERTINENT HISTORY: R fibularis tendon rupture, 2012 melanoma; history of surgeries for R ankle fibularis tendons  PAIN:  Are you having pain? Yes: NPRS scale: 3/10 Pain location: L posterior hip Pain description: constant dull ache Aggravating factors: sitting, standing, walking  too long, walking on hard surfaces, descending stairs, cross legs Relieving factors: leaning off of it, ibuprofen, Voltaren gel  PRECAUTIONS: None  WEIGHT BEARING RESTRICTIONS No  FALLS:  Has patient fallen in last 6 months? No  LIVING ENVIRONMENT: Lives with: lives with their family and lives with their spouse Lives in: House/apartment Stairs: No, only stairs on back deck Has following equipment at home: None  OCCUPATION: [place], sitting 8 hours; gets up every 45 mins   PLOF: Independent with basic ADLs  PATIENT GOALS : Improve strength;    OBJECTIVE:   DIAGNOSTIC FINDINGS:  MRI  IMPRESSION: 1. Mild tendinosis of the left gluteus minimus tendon. Small partial-thickness tear of the  left gluteus medius tendon insertion. 2. Limited evaluation of the labrum secondary lack of intra-articular fluid. Degeneration of the left labrum. 3. 4 cm right ovarian simple-appearing cyst, not adequately characterized. Recommend prompt follow-up with pelvic US. Reference: JACR 2020 Feb;17(2):248-254    PATIENT SURVEYS:  FOTO: 68  55 @ D/C  COGNITION:  Overall cognitive status: Within functional limits for tasks assessed     SENSATION: Light touch: Impaired    POSTURE: weight shift right in seated, R trunk lean  PALPATION: Significant TTP along L greater trochanter and along muscle belly and mid substance of gluteals, hip rotators, and hip abductors; mild edema noted into greater trochanteric region   LOWER EXTREMITY ROM: apprehension with all L A/PROM; unable to perform seated cross and uncross of L- signficant increase in pain  Active ROM Right eval Left eval  Hip flexion 110 100 p!  Hip extension 10 0  Hip abduction 30 10 p!  Hip adduction    Hip internal rotation 35 20 p!  Hip external rotation 40 20 p!   (Blank rows = not tested)  LOWER EXTREMITY MMT:  MMT Right eval Left eval  Hip flexion 4/5 4-/5 p!  Hip extension 4/5 4-/5 p!  Hip abduction 4/5 4-/5 p!  Hip adduction 4/5 4-/5 p!   (Blank rows = not tested)  LOWER EXTREMITY SPECIAL TESTS:  Hip special tests: Saralyn Pilar (FABER) test: positive , Trendelenburg test: positive , Anterior hip impingement test: unable to test due to pain, and Piriformis test: positive   FUNCTIONAL TESTS:  Step up/down: positive hip drop, painful  GAIT: Distance walked: 64f Assistive device utilized: None Level of assistance: Complete Independence Comments: antalgic gait on flat ground, decreased R step length and L stance time;  Stairs: step pattern ascend and descend with single hand rail; hip rotation compensation to descend  TODAY'S TREATMENT:  Exercises - Hooklying Isometric Clamshell  - 2 x daily - 7 x weekly - 2  sets - 10 reps - 2 hold - Supine Bridge with Mini Swiss Ball Between Knees  - 2 x daily - 7 x weekly - 2 sets - 10 reps - Supine Posterior Pelvic Tilt  - 2 x daily - 7 x weekly - 2 sets - 10 reps - 2 hold  PATIENT EDUCATION:  Education details: MOI, diagnosis, prognosis, anatomy, exercise progression, DOMS expectations, muscle firing,  envelope of function, HEP, POC  Person educated: Patient Education method: Explanation, Demonstration, Tactile cues, Verbal cues, and Handouts Education comprehension: verbalized understanding, returned demonstration, verbal cues required, and tactile cues required   HOME EXERCISE PROGRAM:  Access Code: [email or web address]: [email or web address] Date: 06/28/2022 Prepared by: ADaleen Bo ASSESSMENT:  CLINICAL IMPRESSION: Patient is a 51y.o. female who was seen today for physical therapy evaluation and treatment for c/c of L  hip pain. Pt's s/s appear consistent with history of R gluteus medius tendon rupture. Pt's pain is highly sensitive and irritable. Pt is most limited by strength and functional mobility at this time. Of note, pt also has R foot pain and history of fibularis tendon rupture that is likely contributing to L hip overuse. Pt would benefit from continued skilled therapy in order to reach goals and maximize functional R LE strength and ROM for prevention of further functional decline.     OBJECTIVE IMPAIRMENTS Abnormal gait, decreased activity tolerance, decreased balance, decreased endurance, decreased mobility, difficulty walking, decreased ROM, decreased strength, hypomobility, increased fascial restrictions, increased muscle spasms, impaired flexibility, improper body mechanics, postural dysfunction, and pain.   ACTIVITY LIMITATIONS carrying, lifting, bending, sitting, standing, squatting, stairs, transfers, and locomotion level  PARTICIPATION LIMITATIONS: cleaning, laundry, interpersonal relationship, driving, shopping, community  activity, occupation, and exercise  PERSONAL FACTORS Age, Behavior pattern, Fitness, Past/current experiences, Time since onset of injury/illness/exacerbation, and 1-2 comorbidities:    are also affecting patient's functional outcome.   REHAB POTENTIAL: Fair    CLINICAL DECISION MAKING: Stable/uncomplicated  EVALUATION COMPLEXITY: Low   GOALS:   SHORT TERM GOALS: Target date: 08/09/2022   Pt will become independent with HEP in order to demonstrate synthesis of PT education.   Goal status: INITIAL  2.  Pt will report at least 2 pt reduction on NPRS scale for pain in order to demonstrate functional improvement with household activity, self care, and ADL.   Goal status: INITIAL  3.  Pt will be able to demonstrate ability to descend stairs with reciprocal pattern and single UE in order to demonstrate functional improvement in LE function for self-care and house hold duties.   Goal status: INITIAL  4.  Pt will score at least 19 pt increase on FOTO to demonstrate functional improvement in MCII and pt perceived function.   Baseline:  Goal status: INITIAL    LONG TERM GOALS:  Pt to be scheduled for surgery and will return to PT following. Will create LTG at that time.  PLAN: PT FREQUENCY: 1-2x/week  PT DURATION: 6 wks  PLANNED INTERVENTIONS: Therapeutic exercises, Therapeutic activity, Neuromuscular re-education, Balance training, Gait training, Patient/Family education, Self Care, Joint mobilization, Joint manipulation, Stair training, Orthotic/Fit training, DME instructions, Aquatic Therapy, Dry Needling, Electrical stimulation, Spinal manipulation, Spinal mobilization, Cryotherapy, Moist heat, scar mobilization, Splintting, Taping, Vasopneumatic device, Traction, Ultrasound, Ionotophoresis '4mg'$ /ml Dexamethasone, Manual therapy, and Re-evaluation  PLAN FOR NEXT SESSION: progress isometric glute strength,consider cane/walking stick edu, work on gait and stance time   Daleen Bo,  PT 06/28/2022, 12:17 PM

## 2022-06-28 ENCOUNTER — Other Ambulatory Visit: Payer: Self-pay

## 2022-06-28 ENCOUNTER — Ambulatory Visit (HOSPITAL_BASED_OUTPATIENT_CLINIC_OR_DEPARTMENT_OTHER): Payer: BC Managed Care – PPO | Attending: Orthopaedic Surgery | Admitting: Physical Therapy

## 2022-06-28 ENCOUNTER — Encounter (HOSPITAL_BASED_OUTPATIENT_CLINIC_OR_DEPARTMENT_OTHER): Payer: Self-pay | Admitting: Physical Therapy

## 2022-06-28 DIAGNOSIS — R262 Difficulty in walking, not elsewhere classified: Secondary | ICD-10-CM

## 2022-06-28 DIAGNOSIS — S76012A Strain of muscle, fascia and tendon of left hip, initial encounter: Secondary | ICD-10-CM | POA: Insufficient documentation

## 2022-06-28 DIAGNOSIS — M6281 Muscle weakness (generalized): Secondary | ICD-10-CM | POA: Diagnosis present

## 2022-06-28 DIAGNOSIS — M25552 Pain in left hip: Secondary | ICD-10-CM

## 2022-07-03 ENCOUNTER — Encounter (HOSPITAL_BASED_OUTPATIENT_CLINIC_OR_DEPARTMENT_OTHER): Payer: Self-pay | Admitting: Physical Therapy

## 2022-07-03 ENCOUNTER — Ambulatory Visit (HOSPITAL_BASED_OUTPATIENT_CLINIC_OR_DEPARTMENT_OTHER): Payer: BC Managed Care – PPO | Attending: Orthopaedic Surgery | Admitting: Physical Therapy

## 2022-07-03 DIAGNOSIS — M25552 Pain in left hip: Secondary | ICD-10-CM | POA: Diagnosis present

## 2022-07-03 DIAGNOSIS — R262 Difficulty in walking, not elsewhere classified: Secondary | ICD-10-CM | POA: Diagnosis present

## 2022-07-03 DIAGNOSIS — M6281 Muscle weakness (generalized): Secondary | ICD-10-CM | POA: Insufficient documentation

## 2022-07-03 NOTE — Therapy (Signed)
OUTPATIENT PHYSICAL THERAPY TREATMENT NOTE   Patient Name: Rhonda Porter MRN: [id number] DOB:26-Jan-1971, 51 y.o., female Today's Date: 07/03/2022   PT End of Session - 07/03/22 1557     Visit Number 2    Number of Visits 8    Date for PT Re-Evaluation 09/26/22    Authorization Type BCBS    PT Start Time 1600    PT Stop Time 1620    PT Time Calculation (min) 20 min    Activity Tolerance Patient tolerated treatment well;Patient limited by pain    Behavior During Therapy [facility] for tasks assessed/performed              Past Medical History:  Diagnosis Date   Allergy    Diverticula of colon    Ganglion cyst    Gastritis    History of colonic polyps    Hypertension    on 03/08/15 pt denies high bp in over 10 yrs    IBS (irritable bowel syndrome)    Melanoma ([place]) 10/29/2004   Past Surgical History:  Procedure Laterality Date   ABDOMINAL HYSTERECTOMY  10/29/1997   CARPAL TUNNEL RELEASE     COLONOSCOPY  03/08/2015   KNEE ARTHROSCOPY  10/30/2011   MELANOMA EXCISION  10/29/2004   SPINAL FUSION     UPPER GASTROINTESTINAL ENDOSCOPY  03/08/2015   Dr.Pyrtle   Patient Active Problem List   Diagnosis Date Noted   Lower abdominal pain 04/11/2017   Menopausal symptom 04/11/2017   Pain in pelvis 04/11/2017   LUQ abdominal pain 02/10/2015   Bloating 02/08/2015   Irritable bowel syndrome (IBS) 02/08/2015   Abdominal cramps 02/08/2015   DIVERTICULAR BLEEDING, HX OF 01/18/2009   HYPERTENSION 01/17/2009   COLONIC POLYPS, HYPERPLASTIC, HX OF 01/17/2009    PCP: Lanelle Bal, PA-C  REFERRING PROVIDER: Vanetta Mulders, MD  REFERRING DIAG: [phone] (ICD-10-CM) - Tear of left gluteus medius tendon, initial encounter  THERAPY DIAG:  Pain in left hip  Difficulty walking  Muscle weakness (generalized)  Rationale for Evaluation and Treatment Rehabilitation  ONSET DATE: 2010  SUBJECTIVE:   SUBJECTIVE STATEMENT:  Pt states her surgery is scheduled for 07/30/22. Pt  did heat  ibuprofen exercises and all edu. Pt states the pain is worse than when she started. Pt states the pain went up to an 11/10 on Saturday. She has not tried doing them again bc of the pain. Pt states the ice has helped the pain.    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 Pt states the hip has been hurting off and on since that time. Pt initially thought it was bursitis. Pt states she has had a limp on the L side for several years now. She has had 3 steroid injections into the hip at this time that have offered very little relief. Pt used to ride a stationary bike and walking for exercise. She is no long able to exercise due to pain. Pt has had issues with carrying her grandson due to the pain. Pt is a R side sleeper and the pain will wake her up at night despite using a pillow between knees. Pt states end of day pain is worse. Hip is stiff first thing in the morning. Pt is limited to <1 hrs with any activity due to pain. Pt denies LOB or falls due to hip. Pt denies cancer red flags. Pt denies NT outside of foot nerve damage to R foot.   PERTINENT  HISTORY: R fibularis tendon rupture, 2012 melanoma; history of surgeries for R ankle fibularis tendons  PAIN:  Are you having pain? Yes: NPRS scale: 3/10 Pain location: L posterior hip Pain description: constant dull ache Aggravating factors: sitting, standing, walking too long, walking on hard surfaces, descending stairs, cross legs Relieving factors: leaning off of it, ibuprofen, Voltaren gel  PRECAUTIONS: None  WEIGHT BEARING RESTRICTIONS No  FALLS:  Has patient fallen in last 6 months? No  LIVING ENVIRONMENT: Lives with: lives with their family and lives with their spouse Lives in: House/apartment Stairs: No, only stairs on back deck Has following equipment at home: None  OCCUPATION: [place], sitting 8 hours; gets up every 45 mins   PLOF: Independent with basic ADLs  PATIENT GOALS : Improve  strength;    OBJECTIVE:   DIAGNOSTIC FINDINGS:  MRI  IMPRESSION: 1. Mild tendinosis of the left gluteus minimus tendon. Small partial-thickness tear of the left gluteus medius tendon insertion. 2. Limited evaluation of the labrum secondary lack of intra-articular fluid. Degeneration of the left labrum. 3. 4 cm right ovarian simple-appearing cyst, not adequately characterized. Recommend prompt follow-up with pelvic US. Reference: JACR 2020 Feb;17(2):248-254    PATIENT SURVEYS:  FOTO: 29  55 @ D/C    TODAY'S TREATMENT:  Reduction of frequency of HEP to no irritation, walking stick/cane/AD edu with gait  PATIENT EDUCATION:  Education details: anatomy, exercise progression, DOMS expectations, muscle firing,  envelope of function, HEP, POC  Person educated: Patient Education method: Explanation, Demonstration, Tactile cues, Verbal cues, and Handouts Education comprehension: verbalized understanding, returned demonstration, verbal cues required, and tactile cues required   HOME EXERCISE PROGRAM:  Access Code: [email or web address] URL: [email or web address] Date: 06/28/2022 Prepared by: Daleen Bo  ASSESSMENT:  CLINICAL IMPRESSION: Pt scheduled for surgery on 10/2 for glute med repair. Pt given edu about AD usage to help stress shield L hip at this time as low level HEP exercises have increased pain. Pt advised to reduce frequency in order to reduce irritation but still keep muscle activation prior to surgery. Pt gave verbal understanding to edu about post-op rehab expectations and preparation for surgery. Plan to hold on therapy until pt returns post op. Pt would benefit from continued skilled therapy in order to reach goals and maximize functional R LE strength and ROM for prevention of further functional decline.     OBJECTIVE IMPAIRMENTS Abnormal gait, decreased activity tolerance, decreased balance, decreased endurance, decreased mobility, difficulty walking, decreased  ROM, decreased strength, hypomobility, increased fascial restrictions, increased muscle spasms, impaired flexibility, improper body mechanics, postural dysfunction, and pain.   ACTIVITY LIMITATIONS carrying, lifting, bending, sitting, standing, squatting, stairs, transfers, and locomotion level  PARTICIPATION LIMITATIONS: cleaning, laundry, interpersonal relationship, driving, shopping, community activity, occupation, and exercise  PERSONAL FACTORS Age, Behavior pattern, Fitness, Past/current experiences, Time since onset of injury/illness/exacerbation, and 1-2 comorbidities:    are also affecting patient's functional outcome.   REHAB POTENTIAL: Fair    CLINICAL DECISION MAKING: Stable/uncomplicated  EVALUATION COMPLEXITY: Low   GOALS:   SHORT TERM GOALS: Target date: 08/09/2022   Pt will become independent with HEP in order to demonstrate synthesis of PT education.   Goal status: INITIAL  2.  Pt will report at least 2 pt reduction on NPRS scale for pain in order to demonstrate functional improvement with household activity, self care, and ADL.   Goal status: INITIAL  3.  Pt will be able to demonstrate ability to descend stairs with reciprocal pattern and single UE in order to  demonstrate functional improvement in LE function for self-care and house hold duties.   Goal status: INITIAL  4.  Pt will score at least 19 pt increase on FOTO to demonstrate functional improvement in MCII and pt perceived function.   Baseline:  Goal status: INITIAL    LONG TERM GOALS:  Pt to be scheduled for surgery and will return to PT following. Will create LTG at that time.  PLAN: PT FREQUENCY: 1-2x/week  PT DURATION: 6 wks  PLANNED INTERVENTIONS: Therapeutic exercises, Therapeutic activity, Neuromuscular re-education, Balance training, Gait training, Patient/Family education, Self Care, Joint mobilization, Joint manipulation, Stair training, Orthotic/Fit training, DME instructions, Aquatic  Therapy, Dry Needling, Electrical stimulation, Spinal manipulation, Spinal mobilization, Cryotherapy, Moist heat, scar mobilization, Splintting, Taping, Vasopneumatic device, Traction, Ultrasound, Ionotophoresis '4mg'$ /ml Dexamethasone, Manual therapy, and Re-evaluation    Daleen Bo, PT 07/03/2022, 4:41 PM

## 2022-07-10 ENCOUNTER — Other Ambulatory Visit (HOSPITAL_BASED_OUTPATIENT_CLINIC_OR_DEPARTMENT_OTHER): Payer: Self-pay | Admitting: Orthopaedic Surgery

## 2022-07-10 DIAGNOSIS — S76012A Strain of muscle, fascia and tendon of left hip, initial encounter: Secondary | ICD-10-CM

## 2022-07-16 ENCOUNTER — Encounter (HOSPITAL_BASED_OUTPATIENT_CLINIC_OR_DEPARTMENT_OTHER): Payer: Self-pay | Admitting: Orthopaedic Surgery

## 2022-07-17 NOTE — Telephone Encounter (Signed)
Spoke to patient on phone to clarify necessity and details of work note needed.  Work note uploaded to [employer]

## 2022-07-18 ENCOUNTER — Telehealth: Payer: Self-pay | Admitting: Orthopaedic Surgery

## 2022-07-18 NOTE — Telephone Encounter (Signed)
Received medical records release form, $25.00 cash and disability paperwork/Forwarding to [facility] today

## 2022-07-27 ENCOUNTER — Ambulatory Visit (HOSPITAL_BASED_OUTPATIENT_CLINIC_OR_DEPARTMENT_OTHER): Payer: Self-pay | Admitting: Orthopaedic Surgery

## 2022-07-27 ENCOUNTER — Other Ambulatory Visit: Payer: Self-pay

## 2022-07-27 ENCOUNTER — Other Ambulatory Visit (HOSPITAL_BASED_OUTPATIENT_CLINIC_OR_DEPARTMENT_OTHER): Payer: Self-pay | Admitting: Orthopaedic Surgery

## 2022-07-27 ENCOUNTER — Encounter (HOSPITAL_COMMUNITY): Payer: Self-pay | Admitting: Orthopaedic Surgery

## 2022-07-27 DIAGNOSIS — S76012A Strain of muscle, fascia and tendon of left hip, initial encounter: Secondary | ICD-10-CM

## 2022-07-27 NOTE — Progress Notes (Signed)
PCP - [facility], Lanelle Bal, PA Cardiologist - n/a  Chest x-ray - n/a EKG - n/a Stress Test - n/a ECHO - n/a Cardiac Cath - n/a  ICD Pacemaker/Loop - n/a  Sleep Study -  n/a CPAP - none  ERAS: Clear liquids til 9:15 AM DOS.  STOP now taking any Aspirin (unless otherwise instructed by your surgeon), Aleve, Naproxen, Ibuprofen, Motrin, Advil, Goody's, BC's, all herbal medications, fish oil, and all vitamins.   Coronavirus Screening Do you have any of the following symptoms:  Cough yes/no: No Fever (>100.1F)  yes/no: No Runny nose yes/no: No Sore throat yes/no: No Difficulty breathing/shortness of breath  yes/no: No  Have you traveled in the last 14 days and where? [place]  Patient verbalized understanding of instructions that were given via phone.

## 2022-07-30 ENCOUNTER — Encounter (HOSPITAL_COMMUNITY)
Admission: RE | Disposition: A | Discharge status: Home / Self Care | Payer: Self-pay | Source: Home / Self Care | Attending: Orthopaedic Surgery

## 2022-07-30 ENCOUNTER — Encounter (HOSPITAL_COMMUNITY): Payer: Self-pay | Admitting: Orthopaedic Surgery

## 2022-07-30 ENCOUNTER — Ambulatory Visit (HOSPITAL_COMMUNITY): Payer: BC Managed Care – PPO | Admitting: Anesthesiology

## 2022-07-30 ENCOUNTER — Ambulatory Visit (HOSPITAL_COMMUNITY)
Admission: RE | Admit: 2022-07-30 | Discharge: 2022-07-30 | Disposition: A | Discharge status: Home / Self Care | Payer: BC Managed Care – PPO | Attending: Orthopaedic Surgery | Admitting: Orthopaedic Surgery

## 2022-07-30 ENCOUNTER — Telehealth: Payer: Self-pay | Admitting: Orthopaedic Surgery

## 2022-07-30 ENCOUNTER — Other Ambulatory Visit: Payer: Self-pay

## 2022-07-30 DIAGNOSIS — I10 Essential (primary) hypertension: Secondary | ICD-10-CM | POA: Diagnosis not present

## 2022-07-30 DIAGNOSIS — S76012A Strain of muscle, fascia and tendon of left hip, initial encounter: Secondary | ICD-10-CM | POA: Diagnosis not present

## 2022-07-30 DIAGNOSIS — F17210 Nicotine dependence, cigarettes, uncomplicated: Secondary | ICD-10-CM | POA: Insufficient documentation

## 2022-07-30 DIAGNOSIS — Y939 Activity, unspecified: Secondary | ICD-10-CM | POA: Diagnosis not present

## 2022-07-30 DIAGNOSIS — S76312A Strain of muscle, fascia and tendon of the posterior muscle group at thigh level, left thigh, initial encounter: Secondary | ICD-10-CM | POA: Insufficient documentation

## 2022-07-30 DIAGNOSIS — X58XXXA Exposure to other specified factors, initial encounter: Secondary | ICD-10-CM | POA: Diagnosis not present

## 2022-07-30 HISTORY — PX: GLUTEUS MINIMUS REPAIR: SHX5843

## 2022-07-30 LAB — CBC
HCT: 45.3 % (ref 36.0–46.0)
Hemoglobin: 15.5 g/dL — ABNORMAL HIGH (ref 12.0–15.0)
MCH: 31.5 pg (ref 26.0–34.0)
MCHC: 34.2 g/dL (ref 30.0–36.0)
MCV: 92.1 fL (ref 80.0–100.0)
Platelets: 414 10*3/uL — ABNORMAL HIGH (ref 150–400)
RBC: 4.92 MIL/uL (ref 3.87–5.11)
RDW: 13.7 % (ref 11.5–15.5)
WBC: 14.8 10*3/uL — ABNORMAL HIGH (ref 4.0–10.5)
nRBC: 0 % (ref 0.0–0.2)

## 2022-07-30 LAB — BASIC METABOLIC PANEL
Anion gap: 7 (ref 5–15)
BUN: 9 mg/dL (ref 6–20)
CO2: 24 mmol/L (ref 22–32)
Calcium: 9.2 mg/dL (ref 8.9–10.3)
Chloride: 107 mmol/L (ref 98–111)
Creatinine, Ser: 0.73 mg/dL (ref 0.44–1.00)
GFR, Estimated: >60 mL/min (ref >60)
Glucose, Bld: 99 mg/dL (ref 70–99)
Potassium: 4.1 mmol/L (ref 3.5–5.1)
Sodium: 138 mmol/L (ref 135–145)

## 2022-07-30 SURGERY — REPAIR, TENDON, GLUTEUS MINIMUS
Anesthesia: General | Site: Buttocks | Laterality: Left

## 2022-07-30 MED ORDER — PROMETHAZINE HCL 25 MG/ML IJ SOLN: 6.2500 mg | INTRAMUSCULAR | Status: DC | PRN

## 2022-07-30 MED ORDER — HYDROMORPHONE HCL 2 MG PO TABS: 2.0000 mg | ORAL_TABLET | ORAL | 0 refills | Status: DC | PRN

## 2022-07-30 MED ORDER — OXYCODONE HCL 5 MG PO TABS: 5.0000 mg | ORAL_TABLET | Freq: Once | ORAL | Status: DC | PRN

## 2022-07-30 MED ORDER — ONDANSETRON HCL 4 MG/2ML IJ SOLN
INTRAMUSCULAR | Status: DC | PRN
  Administered 2022-07-30: 4 mg via INTRAVENOUS

## 2022-07-30 MED ORDER — SUGAMMADEX SODIUM 200 MG/2ML IV SOLN
INTRAVENOUS | Status: DC | PRN
  Administered 2022-07-30: 175 mg via INTRAVENOUS

## 2022-07-30 MED ORDER — LIDOCAINE 2% (20 MG/ML) 5 ML SYRINGE
INTRAMUSCULAR | Status: AC
  Filled 2022-07-30: qty 5

## 2022-07-30 MED ORDER — ROCURONIUM BROMIDE 100 MG/10ML IV SOLN
INTRAVENOUS | Status: DC | PRN
  Administered 2022-07-30: 60 mg via INTRAVENOUS

## 2022-07-30 MED ORDER — ASPIRIN 325 MG PO TBEC: 325.0000 mg | DELAYED_RELEASE_TABLET | Freq: Every day | ORAL | 0 refills | Status: DC

## 2022-07-30 MED ORDER — OXYCODONE HCL 5 MG PO TABS
ORAL_TABLET | ORAL | Status: AC
  Filled 2022-07-30: qty 1

## 2022-07-30 MED ORDER — LACTATED RINGERS IV SOLN: INTRAVENOUS | Status: DC

## 2022-07-30 MED ORDER — FENTANYL CITRATE (PF) 250 MCG/5ML IJ SOLN
INTRAMUSCULAR | Status: AC
  Filled 2022-07-30: qty 5

## 2022-07-30 MED ORDER — MIDAZOLAM HCL 2 MG/2ML IJ SOLN
INTRAMUSCULAR | Status: AC
  Filled 2022-07-30: qty 2

## 2022-07-30 MED ORDER — IBUPROFEN 800 MG PO TABS: 800.0000 mg | ORAL_TABLET | Freq: Three times a day (TID) | ORAL | 0 refills | Status: AC

## 2022-07-30 MED ORDER — PHENYLEPHRINE 80 MCG/ML (10ML) SYRINGE FOR IV PUSH (FOR BLOOD PRESSURE SUPPORT)
PREFILLED_SYRINGE | INTRAVENOUS | Status: AC
  Filled 2022-07-30: qty 10

## 2022-07-30 MED ORDER — CHLORHEXIDINE GLUCONATE 0.12 % MT SOLN
15.0000 mL | Freq: Once | OROMUCOSAL | Status: AC
  Administered 2022-07-30: 15 mL via OROMUCOSAL
  Filled 2022-07-30: qty 15

## 2022-07-30 MED ORDER — ACETAMINOPHEN 325 MG PO TABS: 325.0000 mg | ORAL_TABLET | ORAL | Status: DC | PRN

## 2022-07-30 MED ORDER — ACETAMINOPHEN 160 MG/5ML PO SOLN: 325.0000 mg | ORAL | Status: DC | PRN

## 2022-07-30 MED ORDER — ACETAMINOPHEN 500 MG PO TABS: 500.0000 mg | ORAL_TABLET | Freq: Three times a day (TID) | ORAL | 0 refills | Status: AC

## 2022-07-30 MED ORDER — DEXAMETHASONE SODIUM PHOSPHATE 10 MG/ML IJ SOLN
INTRAMUSCULAR | Status: AC
  Filled 2022-07-30: qty 1

## 2022-07-30 MED ORDER — PROPOFOL 10 MG/ML IV BOLUS
INTRAVENOUS | Status: DC | PRN
  Administered 2022-07-30: 130 mg via INTRAVENOUS

## 2022-07-30 MED ORDER — ONDANSETRON HCL 4 MG/2ML IJ SOLN
INTRAMUSCULAR | Status: AC
  Filled 2022-07-30: qty 2

## 2022-07-30 MED ORDER — OXYCODONE HCL 5 MG/5ML PO SOLN: 5.0000 mg | Freq: Once | ORAL | Status: DC | PRN

## 2022-07-30 MED ORDER — ACETAMINOPHEN 500 MG PO TABS
1000.0000 mg | ORAL_TABLET | Freq: Once | ORAL | Status: AC
  Administered 2022-07-30: 1000 mg via ORAL
  Filled 2022-07-30: qty 2

## 2022-07-30 MED ORDER — TRANEXAMIC ACID-NACL 1000-0.7 MG/100ML-% IV SOLN
1000.0000 mg | INTRAVENOUS | Status: AC
  Administered 2022-07-30: 1000 mg via INTRAVENOUS
  Filled 2022-07-30: qty 100

## 2022-07-30 MED ORDER — LIDOCAINE 2% (20 MG/ML) 5 ML SYRINGE
INTRAMUSCULAR | Status: DC | PRN
  Administered 2022-07-30: 40 mg via INTRAVENOUS

## 2022-07-30 MED ORDER — PHENYLEPHRINE 80 MCG/ML (10ML) SYRINGE FOR IV PUSH (FOR BLOOD PRESSURE SUPPORT)
PREFILLED_SYRINGE | INTRAVENOUS | Status: DC | PRN
  Administered 2022-07-30: 160 ug via INTRAVENOUS

## 2022-07-30 MED ORDER — MIDAZOLAM HCL 5 MG/5ML IJ SOLN
INTRAMUSCULAR | Status: DC | PRN
  Administered 2022-07-30: 2 mg via INTRAVENOUS

## 2022-07-30 MED ORDER — CEFAZOLIN SODIUM-DEXTROSE 2-4 GM/100ML-% IV SOLN
2.0000 g | INTRAVENOUS | Status: AC
  Administered 2022-07-30: 2 g via INTRAVENOUS
  Filled 2022-07-30: qty 100

## 2022-07-30 MED ORDER — HYDROMORPHONE HCL 1 MG/ML IJ SOLN
0.2500 mg | INTRAMUSCULAR | Status: DC | PRN
  Administered 2022-07-30 (×4): 0.5 mg via INTRAVENOUS

## 2022-07-30 MED ORDER — 0.9 % SODIUM CHLORIDE (POUR BTL) OPTIME
TOPICAL | Status: DC | PRN
  Administered 2022-07-30: 1000 mL

## 2022-07-30 MED ORDER — ACETAMINOPHEN 10 MG/ML IV SOLN: 1000.0000 mg | Freq: Once | INTRAVENOUS | Status: DC | PRN

## 2022-07-30 MED ORDER — HYDROMORPHONE HCL 1 MG/ML IJ SOLN
INTRAMUSCULAR | Status: AC
  Filled 2022-07-30: qty 1

## 2022-07-30 MED ORDER — ORAL CARE MOUTH RINSE: 15.0000 mL | Freq: Once | OROMUCOSAL | Status: AC

## 2022-07-30 MED ORDER — AMISULPRIDE (ANTIEMETIC) 5 MG/2ML IV SOLN: 10.0000 mg | Freq: Once | INTRAVENOUS | Status: DC | PRN

## 2022-07-30 MED ORDER — FENTANYL CITRATE (PF) 100 MCG/2ML IJ SOLN
INTRAMUSCULAR | Status: DC | PRN
  Administered 2022-07-30 (×4): 50 ug via INTRAVENOUS

## 2022-07-30 MED ORDER — ROCURONIUM BROMIDE 10 MG/ML (PF) SYRINGE
PREFILLED_SYRINGE | INTRAVENOUS | Status: AC
  Filled 2022-07-30: qty 10

## 2022-07-30 MED ORDER — DEXAMETHASONE SODIUM PHOSPHATE 10 MG/ML IJ SOLN
INTRAMUSCULAR | Status: DC | PRN
  Administered 2022-07-30: 10 mg via INTRAVENOUS

## 2022-07-30 MED ORDER — GABAPENTIN 300 MG PO CAPS
300.0000 mg | ORAL_CAPSULE | Freq: Once | ORAL | Status: AC
  Administered 2022-07-30: 300 mg via ORAL
  Filled 2022-07-30: qty 1

## 2022-07-30 SURGICAL SUPPLY — 52 items
ANCH SUT 2 SWLK 19.1 CLS EYLT (Anchor) ×2 IMPLANT
ANCH SUT SLF PNCH 3 LD STRL LF (Anchor) ×2 IMPLANT
ANCHOR FBRTK 2.6 TRIPLE LOAD (Anchor) IMPLANT
ANCHOR SWIVELOCK BIO 4.75X19.1 (Anchor) IMPLANT
BAG COUNTER SPONGE SURGICOUNT (BAG) IMPLANT
BAG SPEC THK2 15X12 ZIP CLS (MISCELLANEOUS) ×1
BAG SPNG CNTER NS LX DISP (BAG)
BAG ZIPLOCK 12X15 (MISCELLANEOUS) ×1 IMPLANT
CLSR STERI-STRIP ANTIMIC 1/2X4 (GAUZE/BANDAGES/DRESSINGS) IMPLANT
COVER SURGICAL LIGHT HANDLE (MISCELLANEOUS) ×1 IMPLANT
DRAPE ORTHO SPLIT 77X108 STRL (DRAPES) ×1
DRAPE SURG 17X11 SM STRL (DRAPES) ×1 IMPLANT
DRAPE SURG ORHT 6 SPLT 77X108 (DRAPES) ×1 IMPLANT
DRAPE U-SHAPE 47X51 STRL (DRAPES) ×1 IMPLANT
DRESSING MEPILEX FLEX 4X4 (GAUZE/BANDAGES/DRESSINGS) ×1 IMPLANT
DRSG AQUACEL AG ADV 3.5X 6 (GAUZE/BANDAGES/DRESSINGS) IMPLANT
DRSG EMULSION OIL 3X16 NADH (GAUZE/BANDAGES/DRESSINGS) ×1 IMPLANT
DRSG MEPILEX FLEX 4X4 (GAUZE/BANDAGES/DRESSINGS) ×1
DRSG MEPILEX POST OP 4X8 (GAUZE/BANDAGES/DRESSINGS) ×1 IMPLANT
DURAPREP 26ML APPLICATOR (WOUND CARE) ×1 IMPLANT
ELECT REM PT RETURN 15FT ADLT (MISCELLANEOUS) ×1 IMPLANT
GAUZE PAD ABD 8X10 STRL (GAUZE/BANDAGES/DRESSINGS) ×1 IMPLANT
GAUZE SPONGE 4X4 12PLY STRL (GAUZE/BANDAGES/DRESSINGS) ×1 IMPLANT
GLOVE BIOGEL PI IND STRL 6.5 (GLOVE) ×1 IMPLANT
GLOVE BIOGEL PI IND STRL 8 (GLOVE) ×1 IMPLANT
GLOVE ECLIPSE 6.0 STRL STRAW (GLOVE) ×1 IMPLANT
GLOVE ECLIPSE 8.0 STRL XLNG CF (GLOVE) IMPLANT
GLOVE SURG ORTHO 8.0 STRL STRW (GLOVE) ×1 IMPLANT
GOWN STRL REUS W/ TWL XL LVL3 (GOWN DISPOSABLE) ×2 IMPLANT
GOWN STRL REUS W/TWL XL LVL3 (GOWN DISPOSABLE) ×2
KIT ANCHOR FBRTK 2.6 STR (KITS) IMPLANT
KIT BASIN OR (CUSTOM PROCEDURE TRAY) ×1 IMPLANT
KIT TURNOVER KIT A (KITS) IMPLANT
MANIFOLD NEPTUNE II (INSTRUMENTS) ×1 IMPLANT
NS IRRIG 1000ML POUR BTL (IV SOLUTION) ×1 IMPLANT
PACK TOTAL JOINT (CUSTOM PROCEDURE TRAY) ×1 IMPLANT
PROTECTOR NERVE ULNAR (MISCELLANEOUS) ×1 IMPLANT
STAPLER VISISTAT (STAPLE) IMPLANT
STRIP CLOSURE SKIN 1/2X4 (GAUZE/BANDAGES/DRESSINGS) IMPLANT
SUT ETHIBOND NAB CT1 #1 30IN (SUTURE) IMPLANT
SUT FIBERWIRE #2 38 T-5 BLUE (SUTURE)
SUT MNCRL AB 3-0 PS2 18 (SUTURE) IMPLANT
SUT MNCRL AB 4-0 PS2 18 (SUTURE) ×1 IMPLANT
SUT VIC AB 0 CTX 36 (SUTURE) ×1
SUT VIC AB 0 CTX36XBRD ANTBCTR (SUTURE) IMPLANT
SUT VIC AB 1 CT1 27 (SUTURE) ×3
SUT VIC AB 1 CT1 27XBRD ANTBC (SUTURE) ×3 IMPLANT
SUT VIC AB 2-0 CT1 27 (SUTURE) ×2
SUT VIC AB 2-0 CT1 TAPERPNT 27 (SUTURE) ×1 IMPLANT
SUTURE FIBERWR #2 38 T-5 BLUE (SUTURE) IMPLANT
TOWEL OR 17X26 10 PK STRL BLUE (TOWEL DISPOSABLE) ×2 IMPLANT
WATER STERILE IRR 1000ML POUR (IV SOLUTION) ×1 IMPLANT

## 2022-07-30 NOTE — Anesthesia Preprocedure Evaluation (Addendum)
Anesthesia Evaluation  Patient identified by MRN, date of birth, ID band Patient awake    Reviewed: Allergy & Precautions, NPO status , Patient's Chart, lab work & pertinent test results  Airway Mallampati: I  TM Distance: >3 FB Neck ROM: Full    Dental  (+) Teeth Intact, Dental Advisory Given   Pulmonary Current Smoker and Patient abstained from smoking.,    breath sounds clear to auscultation       Cardiovascular hypertension, Pt. on medications  Rhythm:Regular Rate:Normal     Neuro/Psych negative neurological ROS  negative psych ROS   GI/Hepatic negative GI ROS, Neg liver ROS,   Endo/Other  negative endocrine ROS  Renal/GU negative Renal ROS     Musculoskeletal negative musculoskeletal ROS (+)   Abdominal Normal abdominal exam  (+)   Peds  Hematology negative hematology ROS (+)   Anesthesia Other Findings   Reproductive/Obstetrics                            Anesthesia Physical Anesthesia Plan  ASA: 2  Anesthesia Plan: General   Post-op Pain Management:    Induction: Intravenous  PONV Risk Score and Plan: 3 and Dexamethasone, Ondansetron and Midazolam  Airway Management Planned: Oral ETT  Additional Equipment: None  Intra-op Plan:   Post-operative Plan: Extubation in OR  Informed Consent: I have reviewed the patients History and Physical, chart, labs and discussed the procedure including the risks, benefits and alternatives for the proposed anesthesia with the patient or authorized representative who has indicated his/her understanding and acceptance.     Dental advisory given  Plan Discussed with: CRNA  Anesthesia Plan Comments:        Anesthesia Quick Evaluation

## 2022-07-30 NOTE — Telephone Encounter (Signed)
[place] forms received. To [employer]. ?

## 2022-07-30 NOTE — Discharge Instructions (Signed)
     Discharge Instructions    Attending Surgeon: Vanetta Mulders, MD Office Phone Number: [phone]   Diagnosis and Procedures:    Surgeries Performed: Left hip gluteus medius repair  Discharge Plan:    Diet: Resume usual diet. Begin with light or bland foods.  Drink plenty of fluids.  Activity:  Touchdown weight bearing left leg. You are advised to go home directly from the hospital or surgical center. Restrict your activities.  GENERAL INSTRUCTIONS: 1.  Please apply ice to your wound to help with swelling and inflammation. This will improve your comfort and your overall recovery following surgery.     2. Please call Dr. Eddie Dibbles office at [phone] with questions Monday-Friday during business hours. If no one answers, please leave a message and someone should get back to the patient within 24 hours. For emergencies please call 911 or proceed to the emergency room.   3. Patient to notify surgical team if experiences any of the following: Bowel/Bladder dysfunction, uncontrolled pain, nerve/muscle weakness, incision with increased drainage or redness, nausea/vomiting and Fever greater than 101.0 F.  Be alert for signs of infection including redness, streaking, odor, fever or chills. Be alert for excessive pain or bleeding and notify your surgeon immediately.  WOUND INSTRUCTIONS:   Leave your dressing, cast, or splint in place until your post operative visit.  Keep it clean and dry.  Always keep the incision clean and dry until the staples/sutures are removed. If there is no drainage from the incision you should keep it open to air. If there is drainage from the incision you must keep it covered at all times until the drainage stops  Do not soak in a bath tub, hot tub, pool, lake or other body of water until 21 days after your surgery and your incision is completely dry and healed.  If you have removable sutures (or staples) they must be removed 10-14 days (unless otherwise  instructed) from the day of your surgery.     1)  Elevate the extremity as much as possible.  2)  Keep the dressing clean and dry.  3)  Please call us if the dressing becomes wet or dirty.  4)  If you are experiencing worsening pain or worsening swelling, please call.     MEDICATIONS: Resume all previous home medications at the previous prescribed dose and frequency unless otherwise noted Start taking the  pain medications on an as-needed basis as prescribed  Please taper down pain medication over the next week following surgery.  Ideally you should not require a refill of any narcotic pain medication.  Take pain medication with food to minimize nausea. In addition to the prescribed pain medication, you may take over-the-counter pain relievers such as Tylenol.  Do NOT take additional tylenol if your pain medication already has tylenol in it.  Aspirin '325mg'$  daily for four weeks.      FOLLOWUP INSTRUCTIONS: 1. Follow up at the Physical Therapy Clinic 3-4 days following surgery. This appointment should be scheduled unless other arrangements have been made.The Physical Therapy scheduling number is [phone] if an appointment has not already been arranged.  2. Contact Dr. Eddie Dibbles office during office hours at [phone] or the practice after hours line at [phone] for non-emergencies. For medical emergencies call 911.   Discharge Location: Home

## 2022-07-30 NOTE — Transfer of Care (Signed)
Immediate Anesthesia Transfer of Care Note  Patient: Rhonda Porter  Procedure(s) Performed: LEFT GLUTEUS MEDIUS REPAIR (Left: Buttocks)  Patient Location: PACU  Anesthesia Type:General  Level of Consciousness: drowsy and patient cooperative  Airway & Oxygen Therapy: Patient Spontanous Breathing  Post-op Assessment: Report given to RN and Post -op Vital signs reviewed and stable  Post vital signs: Reviewed and stable  Last Vitals:  Vitals Value Taken Time  BP 126/85 07/30/22 1335  Temp    Pulse 96 07/30/22 1336  Resp 22 07/30/22 1336  SpO2 93 % 07/30/22 1336  Vitals shown include unvalidated device data.  Last Pain:  Vitals:   07/30/22 1037  TempSrc:   PainSc: 7       Patients Stated Pain Goal: 2 (11/07/01 4961)  Complications: No notable events documented.

## 2022-07-30 NOTE — Op Note (Signed)
   Date of Surgery: 07/30/2022  INDICATIONS: Rhonda Porter is a 51 y.o.-year-old female with a left gluteus medius tear which has been recalcitrant to injections and conservative management.  At this time she has elected for operative repair..  The risk and benefits of the procedure were discussed in detail and documented in the pre-operative evaluation.   PREOPERATIVE DIAGNOSIS: 1.  Left hip gluteus medius minimus full-thickness tear  POSTOPERATIVE DIAGNOSIS: Same.  PROCEDURE: 1.  Left hip gluteus medius minimus repair 2.  Left hip open trochanteric bursectomy  SURGEON: Yevonne Pax MD  ASSISTANT: Raynelle Fanning, ATC  ANESTHESIA:  general  IV FLUIDS AND URINE: See anesthesia record.  ANTIBIOTICS: Ancef  ESTIMATED BLOOD LOSS: 15 mL.  IMPLANTS:  Implant Name Type Inv. Item Serial No. Manufacturer Lot No. LRB No. Used Action  ANCHOR SWIVELOCK BIO 4.75X19.1 - [id number] Anchor ANCHOR SWIVELOCK BIO 4.75X19.1  [employer]  Left 2 Implanted  SELF-PUNCHING 2.6 [employer] SUTURE ANCHOR     [id number] Left 2 Implanted    DRAINS: None  CULTURES: None  COMPLICATIONS: none  DESCRIPTION OF PROCEDURE:  The patient was identified in the preoperative holding area.  The correct site was marked and confirmed according to nursing.  The patient was subsequently taken back to the operating room.  Antibiotics were given 1 hour prior to incision.  Anesthesia was induced.  He was placed in the lateral position with a beanbag positioner with care to pad the down leg and peroneal nerve.   Patient was prepped and draped in the usual sterile fashion.  Again timeout was performed confirming the correct side.  An approach was made over the lateral aspect of the greater trochanter.  Dissection was initially carried down sharply with 15 blade and electrocautery was used to perform hemostasis.  A 15 blade was used to make a nick in the IT band which was completed with Mayo scissors.  At this point we encountered the  gluteus medius tendon. There was overlying bursa was dissected out sharply with Metzenbaum scissors and removed. This was extremely thin and then completely torn.  This was debrided and the trochanteric lateral posterior facet was prepared using a Cobb.  We then mobilized the gluteus medius abductor muscles with an Allis clamp.  Excess bursal tissue was excised sharply with Mayo scissors.  A double row type configuration was then utilized with 2 Medial Row all suture anchors (a total of 10 limbs).  These were placed through the tissue and then brought to 2 Lateral Row Quattro anchors.  This produced an anatomic footprint restoration of the gluteus tendon.   The wounds were thoroughly irrigated.  We closed in layers of 0 Vicryl for the IT band, 2-0 Vicryl, and staples for skin. The patient was awoken and taken to the PACU.  All counts were correct at the end of the case and no complications.       POSTOPERATIVE PLAN: We will be touchdown weightbearing on the left leg for a total of 2 weeks.  I will see her back in the office in 2 weeks for wound check.  She will begin physical therapy immediately postop.  She will be discharged on aspirin for DVT prophylaxis  Yevonne Pax, MD 1:28 PM

## 2022-07-30 NOTE — Brief Op Note (Signed)
   Brief Op Note  Date of Surgery: 07/30/2022  Preoperative Diagnosis: LEFT GLUTEUS MEDIUS TEAR  Postoperative Diagnosis: same  Procedure: Procedure(s): LEFT GLUTEUS MEDIUS REPAIR  Implants: Implant Name Type Inv. Item Serial No. Manufacturer Lot No. LRB No. Used Action  ANCHOR SWIVELOCK BIO [id number] - [id number] Anchor ANCHOR SWIVELOCK BIO 4.75X19.1  [employer]  Left 2 Implanted  SELF-PUNCHING 2.6 [facility] SUTURE ANCHOR     [id number] Left 2 Implanted    Surgeons: Surgeon(s): Vanetta Mulders, MD  Anesthesia: Regional    Estimated Blood Loss: See anesthesia record  Complications: None  Condition to PACU: Stable  Yevonne Pax, MD 07/30/2022 1:27 PM

## 2022-07-30 NOTE — Interval H&P Note (Signed)
History and Physical Interval Note:  07/30/2022 10:41 AM  Rhonda Porter  has presented today for surgery, with the diagnosis of LEFT GLUTEUS MEDIUS TEAR.  The various methods of treatment have been discussed with the patient and family. After consideration of risks, benefits and other options for treatment, the patient has consented to  Procedure(s): LEFT GLUTEUS MEDIUS REPAIR (Left) as a surgical intervention.  The patient's history has been reviewed, patient examined, no change in status, stable for surgery.  I have reviewed the patient's chart and labs.  Questions were answered to the patient's satisfaction.     Vanetta Mulders

## 2022-07-30 NOTE — H&P (Signed)
Chief Complaint: Referral for left gluteus medius        History of Present Illness:      06/25/2022: Today for follow-up of her left hip.  She did get a few days of relief from her last injection although this is subsequently worn off.  She has begun physical therapy and has also started working on a program for strengthening of the left hip.  This has been going on for the last month.  Despite that she continues to have pain laying on that side as well as going up and down stairs.  She is quite frustrated about her activity level and ability to play with her grandchildren.   Rhonda Porter is a 51 y.o. female presents today as a referral from Dr. Ninfa Linden for several years of left hip pain.  He states that she is seeing seeing pain with laying directly on the left hip.  This time she is disabled and many activities of daily living.  She states that she cannot sit or stand for too long.  She has a 74-monthold grandson which she has a very difficult time playing with.  She states that this awakens her at night.  She has difficulty going up and down stairs.  She is active in her yard.  [place]  This is all been completely limited as a result of her hip pain.  She does feel like the gait is unsteady and off balance.  She has taken ibuprofen and Voltaren gel for this.  She states that she feels significantly weak on the left side.  She is here today for further discussion.  She has previously had 2 injections into the greater trochanteric region initially getting significant relief but most recently only several days of relief.       Surgical History:   None   PMH/PSH/Family History/Social History/Meds/Allergies:         Past Medical History:  Diagnosis Date   Allergy     Diverticula of colon     Ganglion cyst     Gastritis     History of colonic polyps     Hypertension      on 03/08/15  pt denies high bp in over 10 yrs    IBS (irritable bowel syndrome)     [place]         Past Surgical History:  Procedure Laterality Date   ABDOMINAL HYSTERECTOMY   10/29/1997   CARPAL TUNNEL RELEASE       COLONOSCOPY   03/08/2015   KNEE ARTHROSCOPY   10/30/2011   MELANOMA EXCISION   10/29/2004   SPINAL FUSION       UPPER GASTROINTESTINAL ENDOSCOPY   03/08/2015    Dr.Pyrtle    Social History         Socioeconomic History   Marital status: Married      Spouse name: Not on file   Number of children: 2   Years of education: Not on file   Highest education level: Not on file  Occupational History   Occupation: DAudiological scientist [employer]  Tobacco Use   Smoking status: Every Day      Packs/day: 0.50      Types: Cigarettes   Smokeless tobacco: Never   Tobacco comments:      pt declined  Vaping Use  Vaping Use: Never used  Substance and Sexual Activity   Alcohol use: Yes      Comment: once a month per pt   Drug use: No   Sexual activity: Not on file  Other Topics Concern   Not on file  Social History Narrative   Not on file    Social Determinants of Health    Financial Resource Strain: Not on file  Food Insecurity: Not on file  Transportation Needs: Not on file  Physical Activity: Not on file  Stress: Not on file  Social Connections: Not on file         Family History  Problem Relation Age of Onset   Colon polyps Mother     Hypertension Mother     Leukemia Maternal Grandmother     Lymphoma Maternal Grandmother     Colon cancer Neg Hx     Esophageal cancer Neg Hx     Rectal cancer Neg Hx     Stomach cancer Neg Hx           Allergies  Allergen Reactions   Cephalexin Other (See Comments)   Cetirizine Hcl Other (See Comments)   Hydrocodone        Have hypersensitivity to most pain meds   Other     Penicillins     Sulfa Antibiotics            Current Outpatient Medications  Medication Sig Dispense Refill   Ascorbic  Acid (VITAMIN C PO) Take by mouth.       cyclobenzaprine (FLEXERIL) 10 MG tablet Take 10 mg by mouth at bedtime.       dicyclomine (BENTYL) 20 MG tablet TAKE (1) TABLET BY MOUTH (3) TIMES DAILY. 90 tablet 3   ELDERBERRY PO Take by mouth.       fexofenadine (ALLEGRA) 180 MG tablet Take 180 mg by mouth daily.       fluticasone (FLONASE) 50 MCG/ACT nasal spray Place into both nostrils daily.       hydrochlorothiazide (MICROZIDE) 12.5 MG capsule     1   losartan (COZAAR) 100 MG tablet Take 100 mg by mouth daily.       methylPREDNISolone (MEDROL DOSEPAK) 4 MG TBPK tablet 6 day dose pack - take as directed 21 tablet 0   montelukast (SINGULAIR) 10 MG tablet Take 10 mg by mouth as needed.        ondansetron (ZOFRAN ODT) 4 MG disintegrating tablet Take 1 tablet (4 mg total) by mouth every 8 (eight) hours as needed for nausea or vomiting. 20 tablet 0   VITAMIN D PO Take by mouth.       VITAMIN E PO Take by mouth.        No current facility-administered medications for this visit.    Imaging Results (Last 48 hours)  No results found.     Review of Systems:   A ROS was performed including pertinent positives and negatives as documented in the HPI.   Physical Exam :   Constitutional: NAD and appears stated age Neurological: Alert and oriented Psych: Appropriate affect and cooperative There were no vitals taken for this visit.    Comprehensive Musculoskeletal Exam:     Inspection Right Left  Skin No atrophy or gross abnormalities appreciated No atrophy or gross abnormalities appreciated  Palpation      Tenderness None None  Crepitus None None  Range of Motion      Flexion (passive) 120 120  Extension 30 30  IR 30 30  ER 45 45 with pain referred to lateral trochanter  Strength      Flexion  5/5 5/5  Extension 5/5 5/5  Special Tests      FABER Negative Negative  FADIR Negative Negative  ER Lag/Capsular Insufficiency Negative Negative  Instability Negative Negative  Sacroiliac pain  Negative  Negative   Instability      Generalized Laxity No No  Neurologic      sciatic, femoral, obturator nerves intact to light sensation  Vascular/Lymphatic      DP pulse 2+ 2+  Lumbar Exam      Patient has symmetric lumbar range of motion with negative pain referral to hip      Positive Trendelenburg sign with standing on the left side.  There is weakness with left hip abduction   Imaging:   Xray (2 views left hip with AP pelvis): Normal   MRI (pelvis): There is left worse than right tearing of the gluteus minimus and a near full-thickness tear of the gluteus medius on the left overall well-appearing muscle belly.   I personally reviewed and interpreted the radiographs.     Assessment:   51 y.o. female presents with left hip symptoms consistent with gluteus medius and minimus tearing.  I did describe that her MRI does show evidence of a nearly complete tear.  That being said the muscle belly appears to be quite healthy.  Her symptoms are very consistent with this.  At this time we did discuss operative versus nonoperative management.  At this time she has begun a home program as well as initiate physical therapy without significant gain.  I would like her to continue to work on this in hopes of hopefully getting her some relief.  Given the fact that she has not had 3 injections as well with continued limited relief she has wished to discuss surgical intervention.  We did discuss gluteus medius repair as well as what that would entail with rehab.  She is leaning towards this and would likely like to proceed should she not get any relief from continued physical therapy.  She is deferring additional injections at this time.  She has not had prolonged relief from injections or therapy we will plan for surgery at at this time.  -Plan for left hip gluteus medius repair    After a lengthy discussion of treatment options, including risks, benefits, alternatives, complications of surgical  and nonsurgical conservative options, the patient elected surgical repair.   The patient  is aware of the material risks  and complications including, but not limited to injury to adjacent structures, neurovascular injury, infection, numbness, bleeding, implant failure, thermal burns, stiffness, persistent pain, failure to heal, disease transmission from allograft, need for further surgery, dislocation, anesthetic risks, blood clots, risks of death,and others. The probabilities of surgical success and failure discussed with patient given their particular co-morbidities.The time and nature of expected rehabilitation and recovery was discussed.The patient's questions were all answered preoperatively.  No barriers to understanding were noted. I explained the natural history of the disease process and Rx rationale.  I explained to the patient what I considered to be reasonable expectations given their personal situation.  The final treatment plan was arrived at through a shared patient decision making process model.

## 2022-07-30 NOTE — Anesthesia Procedure Notes (Signed)
Procedure Name: Intubation Date/Time: 07/30/2022 12:15 PM  Performed by: Gwyndolyn Saxon, CRNAPre-anesthesia Checklist: Patient identified, Emergency Drugs available, Suction available and Patient being monitored Patient Re-evaluated:Patient Re-evaluated prior to induction Oxygen Delivery Method: Circle system utilized Preoxygenation: Pre-oxygenation with 100% oxygen Induction Type: IV induction Ventilation: Mask ventilation without difficulty Laryngoscope Size: Miller and 2 Grade View: Grade I Tube type: Oral Tube size: 7.0 mm Number of attempts: 1 Airway Equipment and Method: Patient positioned with wedge pillow and Stylet Placement Confirmation: ETT inserted through vocal cords under direct vision, positive ETCO2 and breath sounds checked- equal and bilateral Secured at: 20 cm Tube secured with: Tape Dental Injury: Teeth and Oropharynx as per pre-operative assessment

## 2022-07-30 NOTE — Anesthesia Postprocedure Evaluation (Signed)
Anesthesia Post Note  Patient: Rhonda Porter  Procedure(s) Performed: LEFT GLUTEUS MEDIUS REPAIR (Left: Buttocks)     Patient location during evaluation: PACU Anesthesia Type: General Level of consciousness: awake and alert Pain management: pain level controlled Vital Signs Assessment: post-procedure vital signs reviewed and stable Respiratory status: spontaneous breathing, nonlabored ventilation, respiratory function stable and patient connected to nasal cannula oxygen Cardiovascular status: blood pressure returned to baseline and stable Postop Assessment: no apparent nausea or vomiting Anesthetic complications: no   No notable events documented.  Last Vitals:  Vitals:   07/30/22 1405 07/30/22 1420  BP: 127/79 123/80  Pulse: 84 87  Resp: 16 17  Temp:  36.6 C  SpO2: 94% 91%    Last Pain:  Vitals:   07/30/22 1420  TempSrc:   PainSc: [place] Niv Darley

## 2022-08-01 ENCOUNTER — Encounter (HOSPITAL_COMMUNITY): Payer: Self-pay | Admitting: Orthopaedic Surgery

## 2022-08-03 ENCOUNTER — Other Ambulatory Visit: Payer: Self-pay

## 2022-08-03 ENCOUNTER — Ambulatory Visit (HOSPITAL_BASED_OUTPATIENT_CLINIC_OR_DEPARTMENT_OTHER): Payer: BC Managed Care – PPO | Attending: Orthopaedic Surgery | Admitting: Physical Therapy

## 2022-08-03 ENCOUNTER — Encounter (HOSPITAL_BASED_OUTPATIENT_CLINIC_OR_DEPARTMENT_OTHER): Payer: Self-pay | Admitting: Physical Therapy

## 2022-08-03 DIAGNOSIS — M25552 Pain in left hip: Secondary | ICD-10-CM | POA: Diagnosis present

## 2022-08-03 DIAGNOSIS — R262 Difficulty in walking, not elsewhere classified: Secondary | ICD-10-CM | POA: Diagnosis present

## 2022-08-03 DIAGNOSIS — R600 Localized edema: Secondary | ICD-10-CM | POA: Diagnosis present

## 2022-08-03 DIAGNOSIS — M6281 Muscle weakness (generalized): Secondary | ICD-10-CM | POA: Diagnosis present

## 2022-08-03 DIAGNOSIS — S76012A Strain of muscle, fascia and tendon of left hip, initial encounter: Secondary | ICD-10-CM | POA: Diagnosis present

## 2022-08-03 NOTE — Therapy (Signed)
OUTPATIENT PHYSICAL THERAPY LOWER EXTREMITY RE-EVALUATION   Patient Name: Rhonda Porter MRN: [id number] DOB:1970-11-03, 51 y.o., female Today's Date: 08/03/2022   PT End of Session - 08/03/22 1101     Visit Number 1    Number of Visits 21    Date for PT Re-Evaluation 11/01/22    Authorization Type BCBS    PT Start Time 1100    PT Stop Time 1145    PT Time Calculation (min) 45 min    Activity Tolerance Patient tolerated treatment well;Patient limited by pain    Behavior During Therapy [facility] for tasks assessed/performed              Past Medical History:  Diagnosis Date   Diverticula of colon    Ganglion cyst    Gastritis    History of colonic polyps    Hypertension    on 03/08/15 pt denies high bp in over 10 yrs    IBS (irritable bowel syndrome)    Melanoma ([place]) 10/29/2004   seasonal allergies    Past Surgical History:  Procedure Laterality Date   ABDOMINAL HYSTERECTOMY  10/29/1997   CARPAL TUNNEL RELEASE Right    COLONOSCOPY  03/08/2015   FOOT SURGERY Bilateral    surg x 3 right, surg x 1 left   GLUTEUS MINIMUS REPAIR Left 07/30/2022   Procedure: LEFT GLUTEUS MEDIUS REPAIR;  Surgeon: Vanetta Mulders, MD;  Location: [place];  Service: Orthopedics;  Laterality: Left;   KNEE ARTHROSCOPY Right 10/30/2011   MELANOMA EXCISION  10/29/2004   left shoulder   SPINAL FUSION     UPPER GASTROINTESTINAL ENDOSCOPY  03/08/2015   Dr.Pyrtle   WISDOM TOOTH EXTRACTION     Patient Active Problem List   Diagnosis Date Noted   Tear of left gluteus medius tendon    Lower abdominal pain 04/11/2017   Menopausal symptom 04/11/2017   Pain in pelvis 04/11/2017   LUQ abdominal pain 02/10/2015   Bloating 02/08/2015   Irritable bowel syndrome (IBS) 02/08/2015   Abdominal cramps 02/08/2015   DIVERTICULAR BLEEDING, HX OF 01/18/2009   HYPERTENSION 01/17/2009   COLONIC POLYPS, HYPERPLASTIC, HX OF 01/17/2009    PCP: Lanelle Bal, PA-C  REFERRING PROVIDER: Vanetta Mulders,  MD  REFERRING DIAG: [phone] (ICD-10-CM) - Tear of left gluteus medius tendon, initial encounter  THERAPY DIAG:  Pain in left hip - Plan: PT plan of care cert/re-cert  Difficulty walking - Plan: PT plan of care cert/re-cert  Muscle weakness (generalized) - Plan: PT plan of care cert/re-cert  Localized edema - Plan: PT plan of care cert/re-cert  Rationale for Evaluation and Treatment Rehabilitation  ONSET DATE: Days since surgery: 4  07/30/2022  1.  Left hip gluteus medius minimus repair 2.  Left hip open trochanteric bursectomy  SUBJECTIVE:   SUBJECTIVE STATEMENT: Pt presents today follow surgical repair. Pt states the pain is bad today. She had a better day yesterday. The first two days were very painful. She has been sleeping in the recliner. She has been using the RW at home but has crutches as well. Pt presents with husband today for assistance. Pt has been compliant with aspirin usage and has been compliant with TTWB precautions. Pt denies signs of infection.    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 Pt states the hip has been hurting off and on since that time. Pt initially thought it was bursitis. Pt states she has had a limp on the  L side for several years now. She has had 3 steroid injections into the hip at this time that have offered very little relief. Pt has had issues with carrying her grandson due to the pain. Pt is a R side sleeper and the pain will wake her up at night despite using a pillow between knees.  PERTINENT HISTORY: R fibularis tendon rupture, 2012 melanoma; history of surgeries for R ankle fibularis tendons  PAIN:  Are you having pain? Yes: NPRS scale: 6/10 Pain location: L hip incision site Pain description: dull Aggravating factors: moving Relieving factors: resting  PRECAUTIONS: TTWB for 2 wks  WEIGHT BEARING RESTRICTIONS No  FALLS:  Has patient fallen in last 6 months? No  LIVING  ENVIRONMENT: Lives with: lives with their family and lives with their spouse Lives in: House/apartment Stairs: No, only stairs on back deck Has following equipment at home: None  OCCUPATION: [place], sitting 8 hours  PLOF: Independent with basic ADLs  PATIENT GOALS : Improve strength; return to normal    OBJECTIVE:   DIAGNOSTIC FINDINGS:  MRI  IMPRESSION: 1. Mild tendinosis of the left gluteus minimus tendon. Small partial-thickness tear of the left gluteus medius tendon insertion. 2. Limited evaluation of the labrum secondary lack of intra-articular fluid. Degeneration of the left labrum. 3. 4 cm right ovarian simple-appearing cyst, not adequately characterized. Recommend prompt follow-up with pelvic US. Reference: JACR 2020 Feb;17(2):248-254    PATIENT SURVEYS:  FOTO 1 48 @ DC 19 MCII  COGNITION:  Overall cognitive status: Within functional limits for tasks assessed     SENSATION: Light touch: Impaired    POSTURE: weight shift right in seated, R trunk lean  PALPATION:  TTP along L greater trochanter;  mild edema noted into greater trochanteric region; island bandage in place no signs of drainage or erythema  LOWER EXTREMITY ROM:  PROM ROM Right eval Left eval  Hip flexion 120 35 p!  Hip extension 10 0  Hip abduction 30 10 p!  Hip adduction    Hip internal rotation 35 5 p!  Hip external rotation 40 5 p!   (Blank rows = not tested)  LOWER EXTREMITY MMT: not indicated at this time   GAIT: Distance walked: 30f Assistive device utilized: None Level of assistance: modified Independence Comments: TTWB, step to pattern  Stairs: unable to perform due to pain  TODAY'S TREATMENT:   Exercises - Supine Posterior Pelvic Tilt  - 2 x daily - 7 x weekly - 2 sets - 10 reps - 2 hold - Long Sitting Hamstring Set  - 2 x daily - 7 x weekly - 2 sets - 10 reps - 2 hold - Supine Gluteal Sets  - 2 x daily - 7 x weekly - 3 sets - 10 reps - 2 hold - Seated Ankle Pumps on  Table  - 5-6 x daily - 7 x weekly - 1 sets - 20 reps  PATIENT EDUCATION:  Education details: surgical precautions, thermotherapy, diagnosis, prognosis, anatomy, exercise progression, DOMS expectations, muscle firing,  envelope of function, HEP, POC  Person educated: Patient Education method: Explanation, Demonstration, Tactile cues, Verbal cues, and Handouts Education comprehension: verbalized understanding, returned demonstration, verbal cues required, and tactile cues required   HOME EXERCISE PROGRAM:  Access Code: [email or web address]: [email or web address] Date: 06/28/2022 Prepared by: ADaleen Bo ASSESSMENT:  CLINICAL IMPRESSION: Patient is a 51y.o. female who was seen today for physical therapy evaluation and treatment for s/p L gluteus medius repair and bursectomy. Pt with expected ROM,  strength, and gait deficits at this time. Pt's pain is poorly controlled at this time. Pt with significant pain with PROM. Precautions and protocol provided to both pt and spouse. Of note, pt's RW is slightly too tall but functional at this time. Pt's crutches sized appropriately. However, pt is more comfortable with RW and opts for more stable AD. Plan to continue with PROM at next. Pt would benefit from continued skilled therapy in order to reach goals and maximize functional R LE strength and ROM for prevention of further functional decline.     OBJECTIVE IMPAIRMENTS Abnormal gait, decreased activity tolerance, decreased balance, decreased endurance, decreased mobility, difficulty walking, decreased ROM, decreased strength, hypomobility, increased fascial restrictions, increased muscle spasms, impaired flexibility, improper body mechanics, postural dysfunction, and pain.   ACTIVITY LIMITATIONS carrying, lifting, bending, sitting, standing, squatting, stairs, transfers, and locomotion level  PARTICIPATION LIMITATIONS: cleaning, laundry, interpersonal relationship, driving, shopping, community  activity, occupation, and exercise  PERSONAL FACTORS Age, Behavior pattern, Fitness, Past/current experiences, Time since onset of injury/illness/exacerbation, and 1-2 comorbidities:    are also affecting patient's functional outcome.   REHAB POTENTIAL: Fair    CLINICAL DECISION MAKING: Stable/uncomplicated  EVALUATION COMPLEXITY: Low   GOALS:   SHORT TERM GOALS: Target date: 09/14/2022   Pt will become independent with HEP in order to demonstrate synthesis of PT education.   Goal status: INITIAL  2.  Pt will report at least 2 pt reduction on NPRS scale for pain in order to demonstrate functional improvement with household activity, self care, and ADL.   Goal status: INITIAL  3.  Pt will be able to demonstrate ability to descend stairs with reciprocal pattern and single UE in order to demonstrate functional improvement in LE function for self-care and house hold duties.   Goal status: INITIAL  4.  Pt will score at least 19 pt increase on FOTO to demonstrate functional improvement in MCII and pt perceived function.   Baseline:  Goal status: INITIAL    LONG TERM GOALS:  10/26/2022  Pt  will become independent with final HEP in order to demonstrate synthesis of PT education. Goal status: INITIAL  2.  Pt will score >/= 48 on FOTO to demonstrate improvement in perceived L hip function. .   Goal status: INITIAL  3.  Pt will be able to demonstrate  full depth DL squat in order to demonstrate functional improvement in bilat LE strength for return to PLOF and exercise.   Goal status: INITIAL  4.  Pt will be able to demonstrate/report ability to walk >30 mins without pain or need for assistance in order to demonstrate functional improvement and tolerance to exercise and community mobility.  Goal status: INITIAL   PLAN: PT FREQUENCY: 1-2x/week  PT DURATION: 12 wks  PLANNED INTERVENTIONS: Therapeutic exercises, Therapeutic activity, Neuromuscular re-education, Balance  training, Gait training, Patient/Family education, Self Care, Joint mobilization, Joint manipulation, Stair training, Orthotic/Fit training, DME instructions, Aquatic Therapy, Dry Needling, Electrical stimulation, Spinal manipulation, Spinal mobilization, Cryotherapy, Moist heat, scar mobilization, Splintting, Taping, Vasopneumatic device, Traction, Ultrasound, Ionotophoresis '4mg'$ /ml Dexamethasone, Manual therapy, and Re-evaluation  PLAN FOR NEXT SESSION: PROM per protocol, gait training   Daleen Bo, PT 08/03/2022, 12:05 PM

## 2022-08-09 ENCOUNTER — Encounter (HOSPITAL_BASED_OUTPATIENT_CLINIC_OR_DEPARTMENT_OTHER): Payer: Self-pay | Admitting: Physical Therapy

## 2022-08-09 ENCOUNTER — Ambulatory Visit (HOSPITAL_BASED_OUTPATIENT_CLINIC_OR_DEPARTMENT_OTHER): Payer: BC Managed Care – PPO | Admitting: Physical Therapy

## 2022-08-09 DIAGNOSIS — M25552 Pain in left hip: Secondary | ICD-10-CM

## 2022-08-09 DIAGNOSIS — M6281 Muscle weakness (generalized): Secondary | ICD-10-CM

## 2022-08-09 DIAGNOSIS — R600 Localized edema: Secondary | ICD-10-CM

## 2022-08-09 DIAGNOSIS — R262 Difficulty in walking, not elsewhere classified: Secondary | ICD-10-CM

## 2022-08-09 NOTE — Therapy (Signed)
OUTPATIENT PHYSICAL THERAPY LOWER EXTREMITY   Patient Name: Rhonda Porter MRN: [id number] DOB:May 14, 1971, 51 y.o., female Today's Date: 08/09/2022   PT End of Session - 08/09/22 1424     Visit Number 2    Number of Visits 21    Date for PT Re-Evaluation 11/01/22    Authorization Type BCBS    PT Start Time 1346    PT Stop Time 1415    PT Time Calculation (min) 29 min    Activity Tolerance Patient tolerated treatment well;Patient limited by pain    Behavior During Therapy [facility] for tasks assessed/performed               Past Medical History:  Diagnosis Date   Diverticula of colon    Ganglion cyst    Gastritis    History of colonic polyps    Hypertension    on 03/08/15 pt denies high bp in over 10 yrs    IBS (irritable bowel syndrome)    Melanoma ([place]) 10/29/2004   seasonal allergies    Past Surgical History:  Procedure Laterality Date   ABDOMINAL HYSTERECTOMY  10/29/1997   CARPAL TUNNEL RELEASE Right    COLONOSCOPY  03/08/2015   FOOT SURGERY Bilateral    surg x 3 right, surg x 1 left   GLUTEUS MINIMUS REPAIR Left 07/30/2022   Procedure: LEFT GLUTEUS MEDIUS REPAIR;  Surgeon: Vanetta Mulders, MD;  Location: [place];  Service: Orthopedics;  Laterality: Left;   KNEE ARTHROSCOPY Right 10/30/2011   MELANOMA EXCISION  10/29/2004   left shoulder   SPINAL FUSION     UPPER GASTROINTESTINAL ENDOSCOPY  03/08/2015   Dr.Pyrtle   WISDOM TOOTH EXTRACTION     Patient Active Problem List   Diagnosis Date Noted   Tear of left gluteus medius tendon    Lower abdominal pain 04/11/2017   Menopausal symptom 04/11/2017   Pain in pelvis 04/11/2017   LUQ abdominal pain 02/10/2015   Bloating 02/08/2015   Irritable bowel syndrome (IBS) 02/08/2015   Abdominal cramps 02/08/2015   DIVERTICULAR BLEEDING, HX OF 01/18/2009   HYPERTENSION 01/17/2009   COLONIC POLYPS, HYPERPLASTIC, HX OF 01/17/2009    PCP: Lanelle Bal, PA-C  REFERRING PROVIDER: Vanetta Mulders, MD  REFERRING DIAG:  [phone] (ICD-10-CM) - Tear of left gluteus medius tendon, initial encounter  THERAPY DIAG:  Pain in left hip  Difficulty walking  Muscle weakness (generalized)  Localized edema  Rationale for Evaluation and Treatment Rehabilitation  ONSET DATE: Days since surgery: 10  07/30/2022  1.  Left hip gluteus medius minimus repair 2.  Left hip open trochanteric bursectomy  SUBJECTIVE:   SUBJECTIVE STATEMENT:     Eval:  Pt presents today follow surgical repair. Pt states the pain is bad today. She had a better day yesterday. The first two days were very painful. She has been sleeping in the recliner. She has been using the RW at home but has crutches as well. Pt presents with husband today for assistance. Pt has been compliant with aspirin usage and has been compliant with TTWB precautions. Pt denies signs of infection.    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 Pt states the hip has been hurting off and on since that time. Pt initially thought it was bursitis. Pt states she has had a limp on the L side for several years now. She has had 3 steroid injections into the hip at this time that have offered very  little relief. Pt has had issues with carrying her grandson due to the pain. Pt is a R side sleeper and the pain will wake her up at night despite using a pillow between knees.  PERTINENT HISTORY: R fibularis tendon rupture, 2012 melanoma; history of surgeries for R ankle fibularis tendons  PAIN:  Are you having pain? Yes: NPRS scale: 6/10 Pain location: L hip incision site Pain description: dull Aggravating factors: moving Relieving factors: resting  PRECAUTIONS: TTWB for 2 wks  WEIGHT BEARING RESTRICTIONS No  FALLS:  Has patient fallen in last 6 months? No  LIVING ENVIRONMENT: Lives with: lives with their family and lives with their spouse Lives in: House/apartment Stairs: No, only stairs on back deck Has following  equipment at home: None  OCCUPATION: [place], sitting 8 hours  PLOF: Independent with basic ADLs  PATIENT GOALS : Improve strength; return to normal    OBJECTIVE:   DIAGNOSTIC FINDINGS:  MRI  IMPRESSION: 1. Mild tendinosis of the left gluteus minimus tendon. Small partial-thickness tear of the left gluteus medius tendon insertion. 2. Limited evaluation of the labrum secondary lack of intra-articular fluid. Degeneration of the left labrum. 3. 4 cm right ovarian simple-appearing cyst, not adequately characterized. Recommend prompt follow-up with pelvic US. Reference: JACR 2020 Feb;17(2):248-254    PATIENT SURVEYS:  FOTO 1 48 @ DC 19 MCII   TODAY'S TREATMENT: PROM flexion to 90, no passive ADD or IR, Abd to 30 deg  Supine knee slides 10x  PATIENT EDUCATION:  Education details: surgical precautions, thermotherapy, diagnosis, prognosis, anatomy, exercise progression, DOMS expectations, muscle firing,  envelope of function, HEP, POC  Person educated: Patient Education method: Explanation, Demonstration, Tactile cues, Verbal cues, and Handouts Education comprehension: verbalized understanding, returned demonstration, verbal cues required, and tactile cues required   HOME EXERCISE PROGRAM:  Access Code: [email or web address] URL: [email or web address] Date: 06/28/2022 Prepared by: Daleen Bo  ASSESSMENT:  CLINICAL IMPRESSION: Pt with much better managed pain at today's session. Able to get to full 90 deg of hip flexion and able to demo good awareness of precautions, Pt advised to continue with previous HEP and icing until she sees MD for progression of WB at next visit. Pt with mild stretching but no pain during session. Continue per protocol at next. Consider ADD isometrics at next treatment.  Pt would benefit from continued skilled therapy in order to reach goals and maximize functional R LE strength and ROM for prevention of further functional decline.     OBJECTIVE  IMPAIRMENTS Abnormal gait, decreased activity tolerance, decreased balance, decreased endurance, decreased mobility, difficulty walking, decreased ROM, decreased strength, hypomobility, increased fascial restrictions, increased muscle spasms, impaired flexibility, improper body mechanics, postural dysfunction, and pain.   ACTIVITY LIMITATIONS carrying, lifting, bending, sitting, standing, squatting, stairs, transfers, and locomotion level  PARTICIPATION LIMITATIONS: cleaning, laundry, interpersonal relationship, driving, shopping, community activity, occupation, and exercise  PERSONAL FACTORS Age, Behavior pattern, Fitness, Past/current experiences, Time since onset of injury/illness/exacerbation, and 1-2 comorbidities:    are also affecting patient's functional outcome.   REHAB POTENTIAL: Fair    CLINICAL DECISION MAKING: Stable/uncomplicated  EVALUATION COMPLEXITY: Low   GOALS:   SHORT TERM GOALS: Target date: 09/20/2022   Pt will become independent with HEP in order to demonstrate synthesis of PT education.   Goal status: INITIAL  2.  Pt will report at least 2 pt reduction on NPRS scale for pain in order to demonstrate functional improvement with household activity, self care, and ADL.   Goal status: INITIAL  3.  Pt will be able to demonstrate ability to descend stairs with reciprocal pattern and single UE in order to demonstrate functional improvement in LE function for self-care and house hold duties.   Goal status: INITIAL  4.  Pt will score at least 19 pt increase on FOTO to demonstrate functional improvement in MCII and pt perceived function.   Baseline:  Goal status: INITIAL    LONG TERM GOALS:  11/01/2022  Pt  will become independent with final HEP in order to demonstrate synthesis of PT education. Goal status: INITIAL  2.  Pt will score >/= 48 on FOTO to demonstrate improvement in perceived L hip function. .   Goal status: INITIAL  3.  Pt will be able to  demonstrate  full depth DL squat in order to demonstrate functional improvement in bilat LE strength for return to PLOF and exercise.   Goal status: INITIAL  4.  Pt will be able to demonstrate/report ability to walk >30 mins without pain or need for assistance in order to demonstrate functional improvement and tolerance to exercise and community mobility.  Goal status: INITIAL   PLAN: PT FREQUENCY: 1-2x/week  PT DURATION: 12 wks  PLANNED INTERVENTIONS: Therapeutic exercises, Therapeutic activity, Neuromuscular re-education, Balance training, Gait training, Patient/Family education, Self Care, Joint mobilization, Joint manipulation, Stair training, Orthotic/Fit training, DME instructions, Aquatic Therapy, Dry Needling, Electrical stimulation, Spinal manipulation, Spinal mobilization, Cryotherapy, Moist heat, scar mobilization, Splintting, Taping, Vasopneumatic device, Traction, Ultrasound, Ionotophoresis '4mg'$ /ml Dexamethasone, Manual therapy, and Re-evaluation  PLAN FOR NEXT SESSION: PROM per protocol, gait training   Daleen Bo, PT 08/09/2022, 2:27 PM

## 2022-08-10 ENCOUNTER — Encounter (HOSPITAL_BASED_OUTPATIENT_CLINIC_OR_DEPARTMENT_OTHER): Payer: Self-pay | Admitting: Physical Therapy

## 2022-08-13 ENCOUNTER — Ambulatory Visit (INDEPENDENT_AMBULATORY_CARE_PROVIDER_SITE_OTHER): Payer: BC Managed Care – PPO | Admitting: Orthopaedic Surgery

## 2022-08-13 ENCOUNTER — Other Ambulatory Visit (HOSPITAL_BASED_OUTPATIENT_CLINIC_OR_DEPARTMENT_OTHER): Payer: Self-pay

## 2022-08-13 DIAGNOSIS — S76012A Strain of muscle, fascia and tendon of left hip, initial encounter: Secondary | ICD-10-CM

## 2022-08-13 MED ORDER — IBUPROFEN 800 MG PO TABS
800.0000 mg | ORAL_TABLET | Freq: Three times a day (TID) | ORAL | 0 refills | Status: AC
  Filled 2022-08-13: qty 30, 10d supply, fill #0

## 2022-08-13 NOTE — Progress Notes (Signed)
Post Operative Evaluation    Procedure/Date of Surgery: Left hip gluteus medius repair 07/30/22  Interval History:   Presents today 2 weeks status post the above procedure.  Overall she is doing very well.  She has been compliant with aspirin.  She has begun PT.  She has been nonweightbearing over the left leg.  She states that the lateral based hip pain has not completely resolved although there is muscular soreness.  She is using a walker for ambulation.   PMH/PSH/Family History/Social History/Meds/Allergies:    Past Medical History:  Diagnosis Date   Diverticula of colon    Ganglion cyst    Gastritis    History of colonic polyps    Hypertension    on 03/08/15 pt denies high bp in over 10 yrs    IBS (irritable bowel syndrome)    Melanoma ([place]) 10/29/2004   seasonal allergies    Past Surgical History:  Procedure Laterality Date   ABDOMINAL HYSTERECTOMY  10/29/1997   CARPAL TUNNEL RELEASE Right    COLONOSCOPY  03/08/2015   FOOT SURGERY Bilateral    surg x 3 right, surg x 1 left   GLUTEUS MINIMUS REPAIR Left 07/30/2022   Procedure: LEFT GLUTEUS MEDIUS REPAIR;  Surgeon: Vanetta Mulders, MD;  Location: [facility];  Service: Orthopedics;  Laterality: Left;   KNEE ARTHROSCOPY Right 10/30/2011   MELANOMA EXCISION  10/29/2004   left shoulder   SPINAL FUSION     UPPER GASTROINTESTINAL ENDOSCOPY  03/08/2015   Dr.Pyrtle   WISDOM TOOTH EXTRACTION     Social History   Socioeconomic History   Marital status: Married    Spouse name: Not on file   Number of children: 2   Years of education: Not on file   Highest education level: Not on file  Occupational History   Occupation: Freight forwarder: [employer]  Tobacco Use   Smoking status: Every Day    Packs/day: 1.00    Years: 34.00    Total pack years: 34.00    Types: Cigarettes   Smokeless tobacco: Never   Tobacco comments:    Quit at age 71yr Vaping Use   Vaping Use: Never used   Substance and Sexual Activity   Alcohol use: Not Currently    Comment: once a month per pt   Drug use: No   Sexual activity: Yes    Birth control/protection: Surgical    Comment: Hysterectomy  Other Topics Concern   Not on file  Social History Narrative   Not on file   Social Determinants of Health   Financial Resource Strain: Not on file  Food Insecurity: Not on file  Transportation Needs: Not on file  Physical Activity: Not on file  Stress: Not on file  Social Connections: Not on file   Family History  Problem Relation Age of Onset   Colon polyps Mother    Hypertension Mother    Leukemia Maternal Grandmother    Lymphoma Maternal Grandmother    Colon cancer Neg Hx    Esophageal cancer Neg Hx    Rectal cancer Neg Hx    Stomach cancer Neg Hx    Allergies  Allergen Reactions   Cetirizine Hcl Other (See Comments)    Not effective   Hydrocodone Itching    Have  hypersensitivity to most pain meds   Other    Cephalexin Rash   Penicillins Rash   Sulfa Antibiotics Rash   Current Outpatient Medications  Medication Sig Dispense Refill   ibuprofen (ADVIL) 800 MG tablet Take 1 tablet (800 mg total) by mouth every 8 (eight) hours for 10 days. Please take with food, please alternate with acetaminophen 30 tablet 0   Ascorbic Acid (VITAMIN C PO) Take 500 mg by mouth daily.     aspirin EC 325 MG tablet Take 1 tablet (325 mg total) by mouth daily. 30 tablet 0   Cholecalciferol (VITAMIN D-3) 125 MCG (5000 UT) TABS Take by mouth.     dicyclomine (BENTYL) 20 MG tablet TAKE (1) TABLET BY MOUTH (3) TIMES DAILY. (Patient taking differently: Take 20 mg by mouth daily as needed for spasms.) 90 tablet 3   ELDERBERRY PO Take 50 mg by mouth daily. Gummy     fluticasone (FLONASE) 50 MCG/ACT nasal spray Place 1 spray into both nostrils daily as needed for allergies.     hydrochlorothiazide (MICROZIDE) 12.5 MG capsule Take 12.5 mg by mouth daily.  1   HYDROmorphone (DILAUDID) 2 MG tablet Take 1  tablet (2 mg total) by mouth every 4 (four) hours as needed for severe pain. 30 tablet 0   levocetirizine (XYZAL) 5 MG tablet Take 5 mg by mouth every evening.     losartan (COZAAR) 50 MG tablet Take 50 mg by mouth daily.     saccharomyces boulardii (FLORASTOR) 250 MG capsule Take 500 mg by mouth daily.     vitamin E 180 MG (400 UNITS) capsule Take 400 Units by mouth daily.     No current facility-administered medications for this visit.   No results found.  Review of Systems:   A ROS was performed including pertinent positives and negatives as documented in the HPI.   Musculoskeletal Exam:    There were no vitals taken for this visit.  Left hip is well-appearing.  No erythema or drainage about the wound.  Sensation is intact in all distributions.  Passive range of motion is to 90 degrees of flexion with 10 degrees internal/external rotation of the hip pain.  Distal neurosensory exam is intact with 2+ dorsalis pedis pulse  Imaging:    None  I personally reviewed and interpreted the radiographs.   Assessment:   2 weeks status post left hip gluteus medius repair overall doing extremely well.  At this time she will progress her weightbearing through the gluteus medius rehab repair protocol.  All restrictions and limitations were specifically discussed.  I will plan to see her back in 4 weeks for reassessment  Plan :    -Return to clinic in 4 weeks for reassessment      I personally saw and evaluated the patient, and participated in the management and treatment plan.  Vanetta Mulders, MD Attending Physician, Orthopedic Surgery  This document was dictated using Dragon voice recognition software. A reasonable attempt at proof reading has been made to minimize errors.

## 2022-08-17 ENCOUNTER — Ambulatory Visit (HOSPITAL_BASED_OUTPATIENT_CLINIC_OR_DEPARTMENT_OTHER): Payer: BC Managed Care – PPO | Admitting: Physical Therapy

## 2022-08-17 ENCOUNTER — Encounter (HOSPITAL_BASED_OUTPATIENT_CLINIC_OR_DEPARTMENT_OTHER): Payer: Self-pay | Admitting: Physical Therapy

## 2022-08-17 DIAGNOSIS — M6281 Muscle weakness (generalized): Secondary | ICD-10-CM

## 2022-08-17 DIAGNOSIS — R600 Localized edema: Secondary | ICD-10-CM

## 2022-08-17 DIAGNOSIS — M25552 Pain in left hip: Secondary | ICD-10-CM | POA: Diagnosis not present

## 2022-08-17 DIAGNOSIS — R262 Difficulty in walking, not elsewhere classified: Secondary | ICD-10-CM

## 2022-08-17 NOTE — Therapy (Signed)
OUTPATIENT PHYSICAL THERAPY LOWER EXTREMITY   Patient Name: Rhonda Porter MRN: [id number] DOB:[birth year], 51 y.o., female Today's Date: 08/17/2022   PT End of Session - 08/17/22 1051     Visit Number 3    Number of Visits 21    Date for PT Re-Evaluation 11/01/22    Authorization Type BCBS    PT Start Time 1015    PT Stop Time 1048    PT Time Calculation (min) 33 min    Activity Tolerance Patient tolerated treatment well;Patient limited by pain    Behavior During Therapy [facility] for tasks assessed/performed                Past Medical History:  Diagnosis Date   Diverticula of colon    Ganglion cyst    Gastritis    History of colonic polyps    Hypertension    on 03/08/15 pt denies high bp in over 10 yrs    IBS (irritable bowel syndrome)    Melanoma ([place]) 10/29/2004   seasonal allergies    Past Surgical History:  Procedure Laterality Date   ABDOMINAL HYSTERECTOMY  10/29/1997   CARPAL TUNNEL RELEASE Right    COLONOSCOPY  03/08/2015   FOOT SURGERY Bilateral    surg x 3 right, surg x 1 left   GLUTEUS MINIMUS REPAIR Left 07/30/2022   Procedure: LEFT GLUTEUS MEDIUS REPAIR;  Surgeon: Vanetta Mulders, MD;  Location: [facility];  Service: Orthopedics;  Laterality: Left;   KNEE ARTHROSCOPY Right 10/30/2011   MELANOMA EXCISION  10/29/2004   left shoulder   SPINAL FUSION     UPPER GASTROINTESTINAL ENDOSCOPY  03/08/2015   Dr.Pyrtle   WISDOM TOOTH EXTRACTION     Patient Active Problem List   Diagnosis Date Noted   Tear of left gluteus medius tendon    Lower abdominal pain 04/11/2017   Menopausal symptom 04/11/2017   Pain in pelvis 04/11/2017   LUQ abdominal pain 02/10/2015   Bloating 02/08/2015   Irritable bowel syndrome (IBS) 02/08/2015   Abdominal cramps 02/08/2015   DIVERTICULAR BLEEDING, HX OF 01/18/2009   HYPERTENSION 01/17/2009   COLONIC POLYPS, HYPERPLASTIC, HX OF 01/17/2009    PCP: Lanelle Bal, PA-C  REFERRING PROVIDER: Vanetta Mulders, MD  REFERRING DIAG:  [phone] (ICD-10-CM) - Tear of left gluteus medius tendon, initial encounter  THERAPY DIAG:  Pain in left hip  Difficulty walking  Muscle weakness (generalized)  Localized edema  Rationale for Evaluation and Treatment Rehabilitation  ONSET DATE: Days since surgery: 18  07/30/2022  1.  Left hip gluteus medius minimus repair 2.  Left hip open trochanteric bursectomy  SUBJECTIVE:   SUBJECTIVE STATEMENT:  Pt states she is able to start bearing weight through that limb. She has soreness with walking. She has some minor swelling near the incision site. She has been able to shower now at home without assist. She was able to stand to take a shower.    Eval:  Pt presents today follow surgical repair. Pt states the pain is bad today. She had a better day yesterday. The first two days were very painful. She has been sleeping in the recliner. She has been using the RW at home but has crutches as well. Pt presents with husband today for assistance. Pt has been compliant with aspirin usage and has been compliant with TTWB precautions. Pt denies signs of infection.    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 Pt states the  hip has been hurting off and on since that time. Pt initially thought it was bursitis. Pt states she has had a limp on the L side for several years now. She has had 3 steroid injections into the hip at this time that have offered very little relief. Pt has had issues with carrying her grandson due to the pain. Pt is a R side sleeper and the pain will wake her up at night despite using a pillow between knees.  PERTINENT HISTORY: R fibularis tendon rupture, 2012 melanoma; history of surgeries for R ankle fibularis tendons  PAIN:  Are you having pain? Yes: NPRS scale: 6/10 Pain location: L hip incision site Pain description: dull Aggravating factors: moving Relieving factors: resting  PRECAUTIONS: TTWB for 2 wks  WEIGHT  BEARING RESTRICTIONS No  FALLS:  Has patient fallen in last 6 months? No  LIVING ENVIRONMENT: Lives with: lives with their family and lives with their spouse Lives in: House/apartment Stairs: No, only stairs on back deck Has following equipment at home: None  OCCUPATION: [employer], sitting 8 hours  PLOF: Independent with basic ADLs  PATIENT GOALS : Improve strength; return to normal    OBJECTIVE:   DIAGNOSTIC FINDINGS:  MRI  IMPRESSION: 1. Mild tendinosis of the left gluteus minimus tendon. Small partial-thickness tear of the left gluteus medius tendon insertion. 2. Limited evaluation of the labrum secondary lack of intra-articular fluid. Degeneration of the left labrum. 3. 4 cm right ovarian simple-appearing cyst, not adequately characterized. Recommend prompt follow-up with pelvic US. Reference: JACR 2020 Feb;17(2):248-254    PATIENT SURVEYS:  FOTO 1 48 @ DC 19 MCII   TODAY'S TREATMENT: PROM flexion to 90, no passive ADD or IR, Abd to 30 deg  Exercises - Supine Posterior Pelvic Tilt  - 2 x daily - 7 x weekly - 2 sets - 10 reps - 2 hold - Sitting Knee Extension with Resistance  - 2 x daily - 7 x weekly - 2 sets - 10 reps - Seated Hamstring Curl with Anchored Resistance  - 2 x daily - 7 x weekly - 2 sets - 10 reps - Heel Raises with Counter Support  - 2 x daily - 7 x weekly - 2 sets - 10 reps - Supine Heel Slide  - 2 x daily - 7 x weekly - 2 sets - 10 reps  PATIENT EDUCATION:  Education details: surgical precautions, thermotherapy, diagnosis, prognosis, anatomy, exercise progression, DOMS expectations, muscle firing,  envelope of function, HEP, POC  Person educated: Patient Education method: Explanation, Demonstration, Tactile cues, Verbal cues, and Handouts Education comprehension: verbalized understanding, returned demonstration, verbal cues required, and tactile cues required   HOME EXERCISE PROGRAM:  Access Code: [email or web address] URL:  [email or web address] Date: 06/28/2022 Prepared by: Daleen Bo  ASSESSMENT:  CLINICAL IMPRESSION: Per protocol, pt able to start progressing WB on L LE. Rush protocol will progress towards FWB by 6-8 wks. Pt advised to continue using RW for ambulation at this time as she feels uncomfortable with axillary crutches. Pt able to continue with PROM to protocol limits today and HEP updated to improve HS and quad activation. No pain noted during session. Plan to continue per protocol. After 4 wks, pt may go past 90 deg of hip flexion.  Pt would benefit from continued skilled therapy in order to reach goals and maximize functional R LE strength and ROM for prevention of further functional decline.     OBJECTIVE IMPAIRMENTS Abnormal gait, decreased activity tolerance, decreased balance, decreased endurance, decreased  mobility, difficulty walking, decreased ROM, decreased strength, hypomobility, increased fascial restrictions, increased muscle spasms, impaired flexibility, improper body mechanics, postural dysfunction, and pain.   ACTIVITY LIMITATIONS carrying, lifting, bending, sitting, standing, squatting, stairs, transfers, and locomotion level  PARTICIPATION LIMITATIONS: cleaning, laundry, interpersonal relationship, driving, shopping, community activity, occupation, and exercise  PERSONAL FACTORS Age, Behavior pattern, Fitness, Past/current experiences, Time since onset of injury/illness/exacerbation, and 1-2 comorbidities:    are also affecting patient's functional outcome.   REHAB POTENTIAL: Fair    CLINICAL DECISION MAKING: Stable/uncomplicated  EVALUATION COMPLEXITY: Low   GOALS:   SHORT TERM GOALS: Target date: 09/28/2022   Pt will become independent with HEP in order to demonstrate synthesis of PT education.   Goal status: INITIAL  2.  Pt will report at least 2 pt reduction on NPRS scale for pain in order to demonstrate functional improvement with household activity,  self care, and ADL.   Goal status: INITIAL  3.  Pt will be able to demonstrate ability to descend stairs with reciprocal pattern and single UE in order to demonstrate functional improvement in LE function for self-care and house hold duties.   Goal status: INITIAL  4.  Pt will score at least 19 pt increase on FOTO to demonstrate functional improvement in MCII and pt perceived function.   Baseline:  Goal status: INITIAL    LONG TERM GOALS:  11/09/2022  Pt  will become independent with final HEP in order to demonstrate synthesis of PT education. Goal status: INITIAL  2.  Pt will score >/= 48 on FOTO to demonstrate improvement in perceived L hip function. .   Goal status: INITIAL  3.  Pt will be able to demonstrate  full depth DL squat in order to demonstrate functional improvement in bilat LE strength for return to PLOF and exercise.   Goal status: INITIAL  4.  Pt will be able to demonstrate/report ability to walk >30 mins without pain or need for assistance in order to demonstrate functional improvement and tolerance to exercise and community mobility.  Goal status: INITIAL   PLAN: PT FREQUENCY: 1-2x/week  PT DURATION: 12 wks  PLANNED INTERVENTIONS: Therapeutic exercises, Therapeutic activity, Neuromuscular re-education, Balance training, Gait training, Patient/Family education, Self Care, Joint mobilization, Joint manipulation, Stair training, Orthotic/Fit training, DME instructions, Aquatic Therapy, Dry Needling, Electrical stimulation, Spinal manipulation, Spinal mobilization, Cryotherapy, Moist heat, scar mobilization, Splintting, Taping, Vasopneumatic device, Traction, Ultrasound, Ionotophoresis '4mg'$ /ml Dexamethasone, Manual therapy, and Re-evaluation  PLAN FOR NEXT SESSION: PROM per protocol, gait training   Daleen Bo, PT 08/17/2022, 10:54 AM

## 2022-08-24 ENCOUNTER — Encounter (HOSPITAL_BASED_OUTPATIENT_CLINIC_OR_DEPARTMENT_OTHER): Payer: Self-pay | Admitting: Physical Therapy

## 2022-08-24 ENCOUNTER — Ambulatory Visit (HOSPITAL_BASED_OUTPATIENT_CLINIC_OR_DEPARTMENT_OTHER): Payer: BC Managed Care – PPO | Admitting: Physical Therapy

## 2022-08-24 DIAGNOSIS — M25552 Pain in left hip: Secondary | ICD-10-CM

## 2022-08-24 DIAGNOSIS — R600 Localized edema: Secondary | ICD-10-CM

## 2022-08-24 DIAGNOSIS — R262 Difficulty in walking, not elsewhere classified: Secondary | ICD-10-CM

## 2022-08-24 DIAGNOSIS — M6281 Muscle weakness (generalized): Secondary | ICD-10-CM

## 2022-08-24 NOTE — Therapy (Signed)
OUTPATIENT PHYSICAL THERAPY LOWER EXTREMITY   Patient Name: NEVE BRANSCOMB MRN: [id number] DOB:Dec 12, 1970, 51 y.o., female Today's Date: 08/24/2022   PT End of Session - 08/24/22 1058     Visit Number 4    Number of Visits 21    Date for PT Re-Evaluation 11/01/22    Authorization Type BCBS    PT Start Time 1100    PT Stop Time 1130    PT Time Calculation (min) 30 min    Activity Tolerance Patient tolerated treatment well;Patient limited by pain    Behavior During Therapy [facility] for tasks assessed/performed                 Past Medical History:  Diagnosis Date   Diverticula of colon    Ganglion cyst    Gastritis    History of colonic polyps    Hypertension    on 03/08/15 pt denies high bp in over 10 yrs    IBS (irritable bowel syndrome)    Melanoma ([place]) 10/29/2004   seasonal allergies    Past Surgical History:  Procedure Laterality Date   ABDOMINAL HYSTERECTOMY  10/29/1997   CARPAL TUNNEL RELEASE Right    COLONOSCOPY  03/08/2015   FOOT SURGERY Bilateral    surg x 3 right, surg x 1 left   GLUTEUS MINIMUS REPAIR Left 07/30/2022   Procedure: LEFT GLUTEUS MEDIUS REPAIR;  Surgeon: Vanetta Mulders, MD;  Location: [facility];  Service: Orthopedics;  Laterality: Left;   KNEE ARTHROSCOPY Right 10/30/2011   MELANOMA EXCISION  10/29/2004   left shoulder   SPINAL FUSION     UPPER GASTROINTESTINAL ENDOSCOPY  03/08/2015   Dr.Pyrtle   WISDOM TOOTH EXTRACTION     Patient Active Problem List   Diagnosis Date Noted   Tear of left gluteus medius tendon    Lower abdominal pain 04/11/2017   Menopausal symptom 04/11/2017   Pain in pelvis 04/11/2017   LUQ abdominal pain 02/10/2015   Bloating 02/08/2015   Irritable bowel syndrome (IBS) 02/08/2015   Abdominal cramps 02/08/2015   DIVERTICULAR BLEEDING, HX OF 01/18/2009   HYPERTENSION 01/17/2009   COLONIC POLYPS, HYPERPLASTIC, HX OF 01/17/2009    PCP: Lanelle Bal, PA-C  REFERRING PROVIDER: Vanetta Mulders, MD  REFERRING DIAG:  [phone] (ICD-10-CM) - Tear of left gluteus medius tendon, initial encounter  THERAPY DIAG:  Pain in left hip  Difficulty walking  Muscle weakness (generalized)  Localized edema  Rationale for Evaluation and Treatment Rehabilitation  ONSET DATE: Days since surgery: 25  07/30/2022  1.  Left hip gluteus medius minimus repair 2.  Left hip open trochanteric bursectomy  SUBJECTIVE:   SUBJECTIVE STATEMENT:  Pt states she is able to sleep in bed all night now and is able to get up at night to use the bathroom. Pt states she has some swelling and some soreness. Pt states she has trouble stepping down out the shoulder.    Eval:  Pt presents today follow surgical repair. Pt states the pain is bad today. She had a better day yesterday. The first two days were very painful. She has been sleeping in the recliner. She has been using the RW at home but has crutches as well. Pt presents with husband today for assistance. Pt has been compliant with aspirin usage and has been compliant with TTWB precautions. Pt denies signs of infection.    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 Pt states the hip has  been hurting off and on since that time. Pt initially thought it was bursitis. Pt states she has had a limp on the L side for several years now. She has had 3 steroid injections into the hip at this time that have offered very little relief. Pt has had issues with carrying her grandson due to the pain. Pt is a R side sleeper and the pain will wake her up at night despite using a pillow between knees.  PERTINENT HISTORY: R fibularis tendon rupture, 2012 melanoma; history of surgeries for R ankle fibularis tendons  PAIN:  Are you having pain? Yes: NPRS scale: 6/10 Pain location: L hip incision site Pain description: dull Aggravating factors: moving Relieving factors: resting  PRECAUTIONS: TTWB for 2 wks  WEIGHT BEARING RESTRICTIONS  No  FALLS:  Has patient fallen in last 6 months? No  LIVING ENVIRONMENT: Lives with: lives with their family and lives with their spouse Lives in: House/apartment Stairs: No, only stairs on back deck Has following equipment at home: None  OCCUPATION: [employer], sitting 8 hours  PLOF: Independent with basic ADLs  PATIENT GOALS : Improve strength; return to normal    OBJECTIVE:   DIAGNOSTIC FINDINGS:  MRI  IMPRESSION: 1. Mild tendinosis of the left gluteus minimus tendon. Small partial-thickness tear of the left gluteus medius tendon insertion. 2. Limited evaluation of the labrum secondary lack of intra-articular fluid. Degeneration of the left labrum. 3. 4 cm right ovarian simple-appearing cyst, not adequately characterized. Recommend prompt follow-up with pelvic US. Reference: JACR 2020 Feb;17(2):248-254    PATIENT SURVEYS:  FOTO 1 48 @ DC 19 MCII   TODAY'S TREATMENT: PROM flexion to 90, no passive ADD or ER, Abd to 35 deg   Exercises -Prone quad stretch 30s 3x - Supine Posterior Pelvic Tilt  - 2 x daily - 7 x weekly - 2 sets - 10 reps - 2 hold - Supine Heel Slide  - 2 x daily - 7 x weekly - 2 sets - 10 reps  Gait: Per MD chat, able to progress to single axillary crutch today Started with bilat axillary crutch and progressed to single. VC and TC given for sequencing, step through pattern, and heel toe pattern; step to pattern edu given for management of up and down 2 steps 2x  PATIENT EDUCATION:  Education details: surgical precautions, thermotherapy, anatomy, exercise progression, DOMS expectations, muscle firing,  envelope of function, HEP, POC  Person educated: Patient Education method: Explanation, Demonstration, Tactile cues, Verbal cues, and Handouts Education comprehension: verbalized understanding, returned demonstration, verbal cues required, and tactile cues required   HOME EXERCISE PROGRAM:  Access Code: [email or web address] URL:  [email or web address] Date: 06/28/2022 Prepared by: Daleen Bo  ASSESSMENT:  CLINICAL IMPRESSION: Pt 3 wks today. After 4 wks, pt may go past 90 deg of hip flexion. Per discussion with MD, pt able to progress to single axillary crutch as pt is essentially bearing no weight through her UE with RW. Pt without pain during session and has met current protocol limits with ROM. Pt to start great degrees at next visit. Plan to continue with protocol at next and consider addition of exercise bike for warm up and addition of LAD/joint mobilizations. Pain is still very well controlled today. Pt would benefit from continued skilled therapy in order to reach goals and maximize functional R LE strength and ROM for prevention of further functional decline.     OBJECTIVE IMPAIRMENTS Abnormal gait, decreased activity tolerance, decreased balance, decreased endurance, decreased mobility, difficulty walking, decreased  ROM, decreased strength, hypomobility, increased fascial restrictions, increased muscle spasms, impaired flexibility, improper body mechanics, postural dysfunction, and pain.   ACTIVITY LIMITATIONS carrying, lifting, bending, sitting, standing, squatting, stairs, transfers, and locomotion level  PARTICIPATION LIMITATIONS: cleaning, laundry, interpersonal relationship, driving, shopping, community activity, occupation, and exercise  PERSONAL FACTORS Age, Behavior pattern, Fitness, Past/current experiences, Time since onset of injury/illness/exacerbation, and 1-2 comorbidities:    are also affecting patient's functional outcome.   REHAB POTENTIAL: Fair    CLINICAL DECISION MAKING: Stable/uncomplicated  EVALUATION COMPLEXITY: Low   GOALS:   SHORT TERM GOALS: Target date: 10/05/2022   Pt will become independent with HEP in order to demonstrate synthesis of PT education.   Goal status: INITIAL  2.  Pt will report at least 2 pt reduction on NPRS scale for pain in order to  demonstrate functional improvement with household activity, self care, and ADL.   Goal status: INITIAL  3.  Pt will be able to demonstrate ability to descend stairs with reciprocal pattern and single UE in order to demonstrate functional improvement in LE function for self-care and house hold duties.   Goal status: INITIAL  4.  Pt will score at least 19 pt increase on FOTO to demonstrate functional improvement in MCII and pt perceived function.   Baseline:  Goal status: INITIAL    LONG TERM GOALS:  11/16/2022  Pt  will become independent with final HEP in order to demonstrate synthesis of PT education. Goal status: INITIAL  2.  Pt will score >/= 48 on FOTO to demonstrate improvement in perceived L hip function. .   Goal status: INITIAL  3.  Pt will be able to demonstrate  full depth DL squat in order to demonstrate functional improvement in bilat LE strength for return to PLOF and exercise.   Goal status: INITIAL  4.  Pt will be able to demonstrate/report ability to walk >30 mins without pain or need for assistance in order to demonstrate functional improvement and tolerance to exercise and community mobility.  Goal status: INITIAL   PLAN: PT FREQUENCY: 1-2x/week  PT DURATION: 12 wks  PLANNED INTERVENTIONS: Therapeutic exercises, Therapeutic activity, Neuromuscular re-education, Balance training, Gait training, Patient/Family education, Self Care, Joint mobilization, Joint manipulation, Stair training, Orthotic/Fit training, DME instructions, Aquatic Therapy, Dry Needling, Electrical stimulation, Spinal manipulation, Spinal mobilization, Cryotherapy, Moist heat, scar mobilization, Splintting, Taping, Vasopneumatic device, Traction, Ultrasound, Ionotophoresis 100m/ml Dexamethasone, Manual therapy, and Re-evaluation  PLAN FOR NEXT SESSION: PROM per protocol, gait training   ADaleen Bo PT 08/24/2022, 11:54 AM

## 2022-08-27 ENCOUNTER — Encounter (HOSPITAL_BASED_OUTPATIENT_CLINIC_OR_DEPARTMENT_OTHER): Payer: Self-pay | Admitting: Orthopaedic Surgery

## 2022-08-29 ENCOUNTER — Encounter (HOSPITAL_BASED_OUTPATIENT_CLINIC_OR_DEPARTMENT_OTHER): Payer: Self-pay | Admitting: Physical Therapy

## 2022-08-29 ENCOUNTER — Ambulatory Visit (HOSPITAL_BASED_OUTPATIENT_CLINIC_OR_DEPARTMENT_OTHER): Payer: BC Managed Care – PPO | Attending: Orthopaedic Surgery | Admitting: Physical Therapy

## 2022-08-29 DIAGNOSIS — R600 Localized edema: Secondary | ICD-10-CM | POA: Diagnosis present

## 2022-08-29 DIAGNOSIS — M25552 Pain in left hip: Secondary | ICD-10-CM | POA: Diagnosis not present

## 2022-08-29 DIAGNOSIS — M6281 Muscle weakness (generalized): Secondary | ICD-10-CM | POA: Insufficient documentation

## 2022-08-29 DIAGNOSIS — R262 Difficulty in walking, not elsewhere classified: Secondary | ICD-10-CM | POA: Diagnosis present

## 2022-08-29 NOTE — Therapy (Signed)
OUTPATIENT PHYSICAL THERAPY LOWER EXTREMITY   Patient Name: Rhonda Porter MRN: [id number] DOB:1971/06/08, 51 y.o., female Today's Date: 08/29/2022   PT End of Session - 08/29/22 1400     Visit Number 5    Number of Visits 21    Date for PT Re-Evaluation 11/01/22    Authorization Type BCBS    PT Start Time 1300    PT Stop Time 1340    PT Time Calculation (min) 40 min    Activity Tolerance Patient tolerated treatment well;Patient limited by pain    Behavior During Therapy [facility] for tasks assessed/performed                  Past Medical History:  Diagnosis Date   Diverticula of colon    Ganglion cyst    Gastritis    History of colonic polyps    Hypertension    on 03/08/15 pt denies high bp in over 10 yrs    IBS (irritable bowel syndrome)    Melanoma ([place]) 10/29/2004   seasonal allergies    Past Surgical History:  Procedure Laterality Date   ABDOMINAL HYSTERECTOMY  10/29/1997   CARPAL TUNNEL RELEASE Right    COLONOSCOPY  03/08/2015   FOOT SURGERY Bilateral    surg x 3 right, surg x 1 left   GLUTEUS MINIMUS REPAIR Left 07/30/2022   Procedure: LEFT GLUTEUS MEDIUS REPAIR;  Surgeon: Vanetta Mulders, MD;  Location: [facility];  Service: Orthopedics;  Laterality: Left;   KNEE ARTHROSCOPY Right 10/30/2011   MELANOMA EXCISION  10/29/2004   left shoulder   SPINAL FUSION     UPPER GASTROINTESTINAL ENDOSCOPY  03/08/2015   Dr.Pyrtle   WISDOM TOOTH EXTRACTION     Patient Active Problem List   Diagnosis Date Noted   Tear of left gluteus medius tendon    Lower abdominal pain 04/11/2017   Menopausal symptom 04/11/2017   Pain in pelvis 04/11/2017   LUQ abdominal pain 02/10/2015   Bloating 02/08/2015   Irritable bowel syndrome (IBS) 02/08/2015   Abdominal cramps 02/08/2015   DIVERTICULAR BLEEDING, HX OF 01/18/2009   HYPERTENSION 01/17/2009   COLONIC POLYPS, HYPERPLASTIC, HX OF 01/17/2009    PCP: Lanelle Bal, PA-C  REFERRING PROVIDER: Vanetta Mulders, MD  REFERRING  DIAG: [phone] (ICD-10-CM) - Tear of left gluteus medius tendon, initial encounter  THERAPY DIAG:  Pain in left hip  Difficulty walking  Muscle weakness (generalized)  Localized edema  Rationale for Evaluation and Treatment Rehabilitation  ONSET DATE: Days since surgery: 30  07/30/2022  1.  Left hip gluteus medius minimus repair 2.  Left hip open trochanteric bursectomy  SUBJECTIVE:   SUBJECTIVE STATEMENT:  Pt states she is walking with and without the crutches now. She has had to use the crutches for half days due to the hip weakness. Pt now at 4 wks.    Eval:  Pt presents today follow surgical repair. Pt states the pain is bad today. She had a better day yesterday. The first two days were very painful. She has been sleeping in the recliner. She has been using the RW at home but has crutches as well. Pt presents with husband today for assistance. Pt has been compliant with aspirin usage and has been compliant with TTWB precautions. Pt denies signs of infection.    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 Pt states the hip has been hurting off and on since that time. Pt initially thought  it was bursitis. Pt states she has had a limp on the L side for several years now. She has had 3 steroid injections into the hip at this time that have offered very little relief. Pt has had issues with carrying her grandson due to the pain. Pt is a R side sleeper and the pain will wake her up at night despite using a pillow between knees.  PERTINENT HISTORY: R fibularis tendon rupture, 2012 melanoma; history of surgeries for R ankle fibularis tendons  PAIN:  Are you having pain? Yes: NPRS scale: 6/10 Pain location: L hip incision site Pain description: dull Aggravating factors: moving Relieving factors: resting  PRECAUTIONS: TTWB for 2 wks  WEIGHT BEARING RESTRICTIONS No  FALLS:  Has patient fallen in last 6 months? No  LIVING  ENVIRONMENT: Lives with: lives with their family and lives with their spouse Lives in: House/apartment Stairs: No, only stairs on back deck Has following equipment at home: None  OCCUPATION: [employer], sitting 8 hours  PLOF: Independent with basic ADLs  PATIENT GOALS : Improve strength; return to normal    OBJECTIVE:   DIAGNOSTIC FINDINGS:  MRI  IMPRESSION: 1. Mild tendinosis of the left gluteus minimus tendon. Small partial-thickness tear of the left gluteus medius tendon insertion. 2. Limited evaluation of the labrum secondary lack of intra-articular fluid. Degeneration of the left labrum. 3. 4 cm right ovarian simple-appearing cyst, not adequately characterized. Recommend prompt follow-up with pelvic US. Reference: JACR 2020 Feb;17(2):248-254    PATIENT SURVEYS:  FOTO 1 48 @ DC 19 MCII   TODAY'S TREATMENT: Recumbent bike L1 seat 4 5 min  Exercises - Supine Piriformis Stretch with Towel  - 1 x daily - 7 x weekly - 1 sets - 10 reps - 5 hold - Prone Quadriceps Stretch with Strap  - 2 x daily - 7 x weekly - 1 sets - 3 reps - 30 hold - Seated Hamstring Stretch  - 2 x daily - 7 x weekly - 1 sets - 3 reps - 30 hold - Supine Bridge  - 1 x daily - 4-5 x weekly - 2 sets - 10 reps - Sit to Stand with Armchair  - 1 x daily - 4-5 x weekly - 3 sets - 5 reps -self HS STM  PATIENT EDUCATION:  Education details: surgical precautions, thermotherapy, anatomy, exercise progression, DOMS expectations, muscle firing,  envelope of function, HEP, POC  Person educated: Patient Education method: Explanation, Demonstration, Tactile cues, Verbal cues, and Handouts Education comprehension: verbalized understanding, returned demonstration, verbal cues required, and tactile cues required   HOME EXERCISE PROGRAM:  Access Code: [email or web address] URL: [email or web address] Date: 06/28/2022 Prepared by: Daleen Bo  ASSESSMENT:  CLINICAL IMPRESSION: Pt 4 wks at this time and able to to  progress with ROM and light glute strength at today's session. Pt does have L quad and HS hypertonicity causing pain with knee flexion and extension during gait. Otherwise, pt with very well managed pain. HEP updated at this time. Plan to continue with progression of land HEP as pt prepares for aquatic therapy sessions. Pt would benefit from continued skilled therapy in order to reach goals and maximize functional R LE strength and ROM for prevention of further functional decline.     OBJECTIVE IMPAIRMENTS Abnormal gait, decreased activity tolerance, decreased balance, decreased endurance, decreased mobility, difficulty walking, decreased ROM, decreased strength, hypomobility, increased fascial restrictions, increased muscle spasms, impaired flexibility, improper body mechanics, postural dysfunction, and pain.   ACTIVITY LIMITATIONS carrying, lifting, bending, sitting,  standing, squatting, stairs, transfers, and locomotion level  PARTICIPATION LIMITATIONS: cleaning, laundry, interpersonal relationship, driving, shopping, community activity, occupation, and exercise  PERSONAL FACTORS Age, Behavior pattern, Fitness, Past/current experiences, Time since onset of injury/illness/exacerbation, and 1-2 comorbidities:    are also affecting patient's functional outcome.   REHAB POTENTIAL: Fair    CLINICAL DECISION MAKING: Stable/uncomplicated  EVALUATION COMPLEXITY: Low   GOALS:   SHORT TERM GOALS: Target date: 10/10/2022   Pt will become independent with HEP in order to demonstrate synthesis of PT education.   Goal status: INITIAL  2.  Pt will report at least 2 pt reduction on NPRS scale for pain in order to demonstrate functional improvement with household activity, self care, and ADL.   Goal status: INITIAL  3.  Pt will be able to demonstrate ability to descend stairs with reciprocal pattern and single UE in order to demonstrate functional improvement in LE function for self-care and house  hold duties.   Goal status: INITIAL  4.  Pt will score at least 19 pt increase on FOTO to demonstrate functional improvement in MCII and pt perceived function.   Baseline:  Goal status: INITIAL    LONG TERM GOALS:  11/21/2022  Pt  will become independent with final HEP in order to demonstrate synthesis of PT education. Goal status: INITIAL  2.  Pt will score >/= 48 on FOTO to demonstrate improvement in perceived L hip function. .   Goal status: INITIAL  3.  Pt will be able to demonstrate  full depth DL squat in order to demonstrate functional improvement in bilat LE strength for return to PLOF and exercise.   Goal status: INITIAL  4.  Pt will be able to demonstrate/report ability to walk >30 mins without pain or need for assistance in order to demonstrate functional improvement and tolerance to exercise and community mobility.  Goal status: INITIAL   PLAN: PT FREQUENCY: 1-2x/week  PT DURATION: 12 wks  PLANNED INTERVENTIONS: Therapeutic exercises, Therapeutic activity, Neuromuscular re-education, Balance training, Gait training, Patient/Family education, Self Care, Joint mobilization, Joint manipulation, Stair training, Orthotic/Fit training, DME instructions, Aquatic Therapy, Dry Needling, Electrical stimulation, Spinal manipulation, Spinal mobilization, Cryotherapy, Moist heat, scar mobilization, Splintting, Taping, Vasopneumatic device, Traction, Ultrasound, Ionotophoresis '4mg'$ /ml Dexamethasone, Manual therapy, and Re-evaluation  PLAN FOR NEXT SESSION: PROM per protocol, gait training   Daleen Bo, PT 08/29/2022, 2:19 PM

## 2022-09-05 ENCOUNTER — Ambulatory Visit (HOSPITAL_BASED_OUTPATIENT_CLINIC_OR_DEPARTMENT_OTHER): Payer: BC Managed Care – PPO | Admitting: Physical Therapy

## 2022-09-05 ENCOUNTER — Encounter (HOSPITAL_BASED_OUTPATIENT_CLINIC_OR_DEPARTMENT_OTHER): Payer: Self-pay | Admitting: Physical Therapy

## 2022-09-05 DIAGNOSIS — R262 Difficulty in walking, not elsewhere classified: Secondary | ICD-10-CM

## 2022-09-05 DIAGNOSIS — M25552 Pain in left hip: Secondary | ICD-10-CM | POA: Diagnosis not present

## 2022-09-05 DIAGNOSIS — M6281 Muscle weakness (generalized): Secondary | ICD-10-CM

## 2022-09-05 DIAGNOSIS — R600 Localized edema: Secondary | ICD-10-CM

## 2022-09-05 NOTE — Therapy (Signed)
OUTPATIENT PHYSICAL THERAPY LOWER EXTREMITY   Patient Name: Rhonda Porter MRN: [id number] DOB:1971-09-04, 51 y.o., female Today's Date: 09/05/2022   PT End of Session - 09/05/22 1345     Visit Number 6    Number of Visits 21    Date for PT Re-Evaluation 11/01/22    Authorization Type BCBS    PT Start Time 1300    PT Stop Time 1340    PT Time Calculation (min) 40 min    Activity Tolerance Patient tolerated treatment well;Patient limited by pain    Behavior During Therapy [facility] for tasks assessed/performed                   Past Medical History:  Diagnosis Date   Diverticula of colon    Ganglion cyst    Gastritis    History of colonic polyps    Hypertension    on 03/08/15 pt denies high bp in over 10 yrs    IBS (irritable bowel syndrome)    Melanoma ([place]) 10/29/2004   seasonal allergies    Past Surgical History:  Procedure Laterality Date   ABDOMINAL HYSTERECTOMY  10/29/1997   CARPAL TUNNEL RELEASE Right    COLONOSCOPY  03/08/2015   FOOT SURGERY Bilateral    surg x 3 right, surg x 1 left   GLUTEUS MINIMUS REPAIR Left 07/30/2022   Procedure: LEFT GLUTEUS MEDIUS REPAIR;  Surgeon: Vanetta Mulders, MD;  Location: [place];  Service: Orthopedics;  Laterality: Left;   KNEE ARTHROSCOPY Right 10/30/2011   MELANOMA EXCISION  10/29/2004   left shoulder   SPINAL FUSION     UPPER GASTROINTESTINAL ENDOSCOPY  03/08/2015   Dr.Pyrtle   WISDOM TOOTH EXTRACTION     Patient Active Problem List   Diagnosis Date Noted   Tear of left gluteus medius tendon    Lower abdominal pain 04/11/2017   Menopausal symptom 04/11/2017   Pain in pelvis 04/11/2017   LUQ abdominal pain 02/10/2015   Bloating 02/08/2015   Irritable bowel syndrome (IBS) 02/08/2015   Abdominal cramps 02/08/2015   DIVERTICULAR BLEEDING, HX OF 01/18/2009   HYPERTENSION 01/17/2009   COLONIC POLYPS, HYPERPLASTIC, HX OF 01/17/2009    PCP: Lanelle Bal, PA-C  REFERRING PROVIDER: Vanetta Mulders, MD  REFERRING  DIAG: [phone] (ICD-10-CM) - Tear of left gluteus medius tendon, initial encounter  THERAPY DIAG:  Pain in left hip  Difficulty walking  Muscle weakness (generalized)  Localized edema  Rationale for Evaluation and Treatment Rehabilitation  ONSET DATE: Days since surgery: 37  07/30/2022  1.  Left hip gluteus medius minimus repair 2.  Left hip open trochanteric bursectomy  SUBJECTIVE:   SUBJECTIVE STATEMENT:  Pt is 5 wks. Pt states the hip no longer hurts. She is walking without an AD but still has L HS and ant knee pain after exercise.    Eval:  Pt presents today follow surgical repair. Pt states the pain is bad today. She had a better day yesterday. The first two days were very painful. She has been sleeping in the recliner. She has been using the RW at home but has crutches as well. Pt presents with husband today for assistance. Pt has been compliant with aspirin usage and has been compliant with TTWB precautions. Pt denies signs of infection.    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 Pt states the hip has been hurting off and on since that time. Pt initially thought it was  bursitis. Pt states she has had a limp on the L side for several years now. She has had 3 steroid injections into the hip at this time that have offered very little relief. Pt has had issues with carrying her grandson due to the pain. Pt is a R side sleeper and the pain will wake her up at night despite using a pillow between knees.  PERTINENT HISTORY: R fibularis tendon rupture, 2012 melanoma; history of surgeries for R ankle fibularis tendons  PAIN:  Are you having pain? no: NPRS scale: 0/10 Pain location: L hip incision site Pain description: dull Aggravating factors: moving Relieving factors: resting PRECAUTIONS: TTWB for 2 wks  WEIGHT BEARING RESTRICTIONS No  FALLS:  Has patient fallen in last 6 months? No  LIVING ENVIRONMENT: Lives with:  lives with their family and lives with their spouse Lives in: House/apartment Stairs: No, only stairs on back deck Has following equipment at home: None  OCCUPATION: Short, sitting 8 hours  PLOF: Independent with basic ADLs  PATIENT GOALS : Improve strength; return to normal    OBJECTIVE:   DIAGNOSTIC FINDINGS:  MRI  IMPRESSION: 1. Mild tendinosis of the left gluteus minimus tendon. Small partial-thickness tear of the left gluteus medius tendon insertion. 2. Limited evaluation of the labrum secondary lack of intra-articular fluid. Degeneration of the left labrum. 3. 4 cm right ovarian simple-appearing cyst, not adequately characterized. Recommend prompt follow-up with pelvic US. Reference: JACR 2020 Feb;17(2):248-254    PATIENT SURVEYS:  FOTO 1 48 @ DC 19 MCII   TODAY'S TREATMENT:  11/8 Recumbent bike L1 seat 4 5 min  Exercises  STM to L adductor group and VMO, rec fem  -seated RTB hip ABD 3s  holds (start with small ROM) 10x  - Supine Piriformis Stretch with Towel  - 1 x daily - 7 x weekly - 1 sets - 10 reps - 5 hold -sidestepping at counter no band 60f x3 laps - Supine Bridge  - 1 x daily - 4-5 x weekly - 2 sets - 10 reps - Sit to Stand with RTB at knees  - 1 x daily - 4-5 x weekly - 3 sets - 5 reps   PATIENT EDUCATION:  Education details:  anatomy, walking/exercise progression, DOMS expectations, muscle firing,  envelope of function, HEP, POC  Person educated: Patient Education method: Explanation, Demonstration, Tactile cues, Verbal cues, and Handouts Education comprehension: verbalized understanding, returned demonstration, verbal cues required, and tactile cues required   HOME EXERCISE PROGRAM:  Access Code: [email or web address]: [email or web address] Date: 06/28/2022 Prepared by: ADaleen Bo ASSESSMENT:  CLINICAL IMPRESSION: Pt 5 wks at this time and able to to progress with ROM and light glute strength at today's session. Pt able to  perform progressive hip strengthening today without hip pain but does have ant knee pain that is consistent with soft tissue irritation of the L adductor group and quad. Pt had improved pain following STM. Pt advised on self STM technique and advised to start slow and gradual with return to walking. HEP frequency reduced in order to facilitate recovery as pt attempts ADL/home care duties. Plan to start aquatic therapy for progressive hip strengthening and ROM as tolerated. Pt would benefit from continued skilled therapy in order to reach goals and maximize functional R LE strength and ROM for prevention of further functional decline.     OBJECTIVE IMPAIRMENTS Abnormal gait, decreased activity tolerance, decreased balance, decreased endurance, decreased mobility, difficulty walking, decreased ROM, decreased strength, hypomobility, increased fascial restrictions,  increased muscle spasms, impaired flexibility, improper body mechanics, postural dysfunction, and pain.   ACTIVITY LIMITATIONS carrying, lifting, bending, sitting, standing, squatting, stairs, transfers, and locomotion level  PARTICIPATION LIMITATIONS: cleaning, laundry, interpersonal relationship, driving, shopping, community activity, occupation, and exercise  PERSONAL FACTORS Age, Behavior pattern, Fitness, Past/current experiences, Time since onset of injury/illness/exacerbation, and 1-2 comorbidities:    are also affecting patient's functional outcome.   REHAB POTENTIAL: Fair    CLINICAL DECISION MAKING: Stable/uncomplicated  EVALUATION COMPLEXITY: Low   GOALS:   SHORT TERM GOALS: Target date: 10/17/2022   Pt will become independent with HEP in order to demonstrate synthesis of PT education.   Goal status: INITIAL  2.  Pt will report at least 2 pt reduction on NPRS scale for pain in order to demonstrate functional improvement with household activity, self care, and ADL.   Goal status: INITIAL  3.  Pt will be able to  demonstrate ability to descend stairs with reciprocal pattern and single UE in order to demonstrate functional improvement in LE function for self-care and house hold duties.   Goal status: INITIAL  4.  Pt will score at least 19 pt increase on FOTO to demonstrate functional improvement in MCII and pt perceived function.   Baseline:  Goal status: INITIAL    LONG TERM GOALS:  11/28/2022  Pt  will become independent with final HEP in order to demonstrate synthesis of PT education. Goal status: INITIAL  2.  Pt will score >/= 48 on FOTO to demonstrate improvement in perceived L hip function. .   Goal status: INITIAL  3.  Pt will be able to demonstrate  full depth DL squat in order to demonstrate functional improvement in bilat LE strength for return to PLOF and exercise.   Goal status: INITIAL  4.  Pt will be able to demonstrate/report ability to walk >30 mins without pain or need for assistance in order to demonstrate functional improvement and tolerance to exercise and community mobility.  Goal status: INITIAL   PLAN: PT FREQUENCY: 1-2x/week  PT DURATION: 12 wks  PLANNED INTERVENTIONS: Therapeutic exercises, Therapeutic activity, Neuromuscular re-education, Balance training, Gait training, Patient/Family education, Self Care, Joint mobilization, Joint manipulation, Stair training, Orthotic/Fit training, DME instructions, Aquatic Therapy, Dry Needling, Electrical stimulation, Spinal manipulation, Spinal mobilization, Cryotherapy, Moist heat, scar mobilization, Splintting, Taping, Vasopneumatic device, Traction, Ultrasound, Ionotophoresis '4mg'$ /ml Dexamethasone, Manual therapy, and Re-evaluation  PLAN FOR NEXT SESSION: aquatic intro, progress per glute med repair protocol, no ROM restrictions   Daleen Bo, PT 09/05/2022, 1:49 PM

## 2022-09-10 ENCOUNTER — Ambulatory Visit (INDEPENDENT_AMBULATORY_CARE_PROVIDER_SITE_OTHER): Payer: BC Managed Care – PPO | Admitting: Orthopaedic Surgery

## 2022-09-10 ENCOUNTER — Encounter (HOSPITAL_BASED_OUTPATIENT_CLINIC_OR_DEPARTMENT_OTHER): Payer: Self-pay | Admitting: Orthopaedic Surgery

## 2022-09-10 DIAGNOSIS — S76012A Strain of muscle, fascia and tendon of left hip, initial encounter: Secondary | ICD-10-CM

## 2022-09-10 NOTE — Progress Notes (Signed)
Post Operative Evaluation    Procedure/Date of Surgery: Left hip gluteus medius repair 07/30/22  Interval History:   Presents today 6 weeks status post the above procedure.  Overall she is doing very well.  She is not able to stand longer and go to the grocery store.  She overall is continuing to improve.  At this point she would like to return to work.  She is complaining of some weakness about the anterior groin but otherwise doing quite well.   PMH/PSH/Family History/Social History/Meds/Allergies:    Past Medical History:  Diagnosis Date   Diverticula of colon    Ganglion cyst    Gastritis    History of colonic polyps    Hypertension    on 03/08/15 pt denies high bp in over 10 yrs    IBS (irritable bowel syndrome)    Melanoma ([place]) 10/29/2004   seasonal allergies    Past Surgical History:  Procedure Laterality Date   ABDOMINAL HYSTERECTOMY  10/29/1997   CARPAL TUNNEL RELEASE Right    COLONOSCOPY  03/08/2015   FOOT SURGERY Bilateral    surg x 3 right, surg x 1 left   GLUTEUS MINIMUS REPAIR Left 07/30/2022   Procedure: LEFT GLUTEUS MEDIUS REPAIR;  Surgeon: Vanetta Mulders, MD;  Location: [place];  Service: Orthopedics;  Laterality: Left;   KNEE ARTHROSCOPY Right 10/30/2011   MELANOMA EXCISION  10/29/2004   left shoulder   SPINAL FUSION     UPPER GASTROINTESTINAL ENDOSCOPY  03/08/2015   Dr.Pyrtle   WISDOM TOOTH EXTRACTION     Social History   Socioeconomic History   Marital status: Married    Spouse name: Not on file   Number of children: 2   Years of education: Not on file   Highest education level: Not on file  Occupational History   Occupation: Freight forwarder: [employer]  Tobacco Use   Smoking status: Every Day    Packs/day: 1.00    Years: 34.00    Total pack years: 34.00    Types: Cigarettes   Smokeless tobacco: Never   Tobacco comments:    Quit at age 56yr Vaping Use   Vaping Use: Never used  Substance  and Sexual Activity   Alcohol use: Not Currently    Comment: once a month per pt   Drug use: No   Sexual activity: Yes    Birth control/protection: Surgical    Comment: Hysterectomy  Other Topics Concern   Not on file  Social History Narrative   Not on file   Social Determinants of Health   Financial Resource Strain: Not on file  Food Insecurity: Not on file  Transportation Needs: Not on file  Physical Activity: Not on file  Stress: Not on file  Social Connections: Not on file   Family History  Problem Relation Age of Onset   Colon polyps Mother    Hypertension Mother    Leukemia Maternal Grandmother    Lymphoma Maternal Grandmother    Colon cancer Neg Hx    Esophageal cancer Neg Hx    Rectal cancer Neg Hx    Stomach cancer Neg Hx    Allergies  Allergen Reactions   Cetirizine Hcl Other (See Comments)    Not effective   Hydrocodone Itching  Have hypersensitivity to most pain meds   Other    Cephalexin Rash   Penicillins Rash   Sulfa Antibiotics Rash   Current Outpatient Medications  Medication Sig Dispense Refill   Ascorbic Acid (VITAMIN C PO) Take 500 mg by mouth daily.     aspirin EC 325 MG tablet Take 1 tablet (325 mg total) by mouth daily. 30 tablet 0   Cholecalciferol (VITAMIN D-3) 125 MCG (5000 UT) TABS Take by mouth.     dicyclomine (BENTYL) 20 MG tablet TAKE (1) TABLET BY MOUTH (3) TIMES DAILY. (Patient taking differently: Take 20 mg by mouth daily as needed for spasms.) 90 tablet 3   ELDERBERRY PO Take 50 mg by mouth daily. Gummy     fluticasone (FLONASE) 50 MCG/ACT nasal spray Place 1 spray into both nostrils daily as needed for allergies.     hydrochlorothiazide (MICROZIDE) 12.5 MG capsule Take 12.5 mg by mouth daily.  1   HYDROmorphone (DILAUDID) 2 MG tablet Take 1 tablet (2 mg total) by mouth every 4 (four) hours as needed for severe pain. 30 tablet 0   levocetirizine (XYZAL) 5 MG tablet Take 5 mg by mouth every evening.     losartan (COZAAR) 50 MG  tablet Take 50 mg by mouth daily.     saccharomyces boulardii (FLORASTOR) 250 MG capsule Take 500 mg by mouth daily.     vitamin E 180 MG (400 UNITS) capsule Take 400 Units by mouth daily.     No current facility-administered medications for this visit.   No results found.  Review of Systems:   A ROS was performed including pertinent positives and negatives as documented in the HPI.   Musculoskeletal Exam:    There were no vitals taken for this visit.  Left hip is well-healed.  Sensation is intact in all distributions.  Active range of motion of the left hip is 120 degrees of flexion with 20 degrees internal and external rotation..  Distal neurosensory exam is intact with 2+ dorsalis pedis pulse  Imaging:    None  I personally reviewed and interpreted the radiographs.   Assessment:   6 weeks status post left hip gluteus medius repair overall doing extremely well.  At this time she will begin the strengthening portion of the protocol.  I will plan to see her back in 6 weeks for reassessment.  All limitations and restrictions were discussed  Plan :    -Return to clinic in 6 weeks for reassessment      I personally saw and evaluated the patient, and participated in the management and treatment plan.  Vanetta Mulders, MD Attending Physician, Orthopedic Surgery  This document was dictated using Dragon voice recognition software. A reasonable attempt at proof reading has been made to minimize errors.

## 2022-09-14 ENCOUNTER — Ambulatory Visit (INDEPENDENT_AMBULATORY_CARE_PROVIDER_SITE_OTHER): Payer: BC Managed Care – PPO | Admitting: Orthopaedic Surgery

## 2022-09-14 DIAGNOSIS — M25552 Pain in left hip: Secondary | ICD-10-CM | POA: Diagnosis not present

## 2022-09-14 MED ORDER — LIDOCAINE HCL 1 % IJ SOLN
4.0000 mL | INTRAMUSCULAR | Status: AC | PRN
  Administered 2022-09-14: 4 mL

## 2022-09-14 MED ORDER — TRIAMCINOLONE ACETONIDE 40 MG/ML IJ SUSP
80.0000 mg | INTRAMUSCULAR | Status: AC | PRN
  Administered 2022-09-14: 80 mg via INTRA_ARTICULAR

## 2022-09-14 NOTE — Progress Notes (Signed)
Post Operative Evaluation    Procedure/Date of Surgery: Left hip gluteus medius repair 07/30/22  Interval History:   Presents today for follow-up.  Overall she has having some groin pain at today's visit.  She did experience occasional pop in this.  She is hoping to pursue a left ultrasound-guided femoral acetabular injection.  PMH/PSH/Family History/Social History/Meds/Allergies:    Past Medical History:  Diagnosis Date  . Diverticula of colon   . Ganglion cyst   . Gastritis   . History of colonic polyps   . Hypertension    on 03/08/15 pt denies high bp in over 10 yrs   . IBS (irritable bowel syndrome)   . Melanoma ([place]) 10/29/2004  . seasonal allergies    Past Surgical History:  Procedure Laterality Date  . ABDOMINAL HYSTERECTOMY  10/29/1997  . CARPAL TUNNEL RELEASE Right   . COLONOSCOPY  03/08/2015  . FOOT SURGERY Bilateral    surg x 3 right, surg x 1 left  . GLUTEUS MINIMUS REPAIR Left 07/30/2022   Procedure: LEFT GLUTEUS MEDIUS REPAIR;  Surgeon: Vanetta Mulders, MD;  Location: [place];  Service: Orthopedics;  Laterality: Left;  . KNEE ARTHROSCOPY Right 10/30/2011  . MELANOMA EXCISION  10/29/2004   left shoulder  . SPINAL FUSION    . UPPER GASTROINTESTINAL ENDOSCOPY  03/08/2015   Dr.Pyrtle  . WISDOM TOOTH EXTRACTION     Social History   Socioeconomic History  . Marital status: Married    Spouse name: Not on file  . Number of children: 2  . Years of education: Not on file  . Highest education level: Not on file  Occupational History  . Occupation: Freight forwarder: [employer]  Tobacco Use  . Smoking status: Every Day    Packs/day: 1.00    Years: 34.00    Total pack years: 34.00    Types: Cigarettes  . Smokeless tobacco: Never  . Tobacco comments:    Quit at age 73yr Vaping Use  . Vaping Use: Never used  Substance and Sexual Activity  . Alcohol use: Not Currently    Comment: once a month per pt  . Drug  use: No  . Sexual activity: Yes    Birth control/protection: Surgical    Comment: Hysterectomy  Other Topics Concern  . Not on file  Social History Narrative  . Not on file   Social Determinants of Health   Financial Resource Strain: Not on file  Food Insecurity: Not on file  Transportation Needs: Not on file  Physical Activity: Not on file  Stress: Not on file  Social Connections: Not on file   Family History  Problem Relation Age of Onset  . Colon polyps Mother   . Hypertension Mother   . Leukemia Maternal Grandmother   . Lymphoma Maternal Grandmother   . Colon cancer Neg Hx   . Esophageal cancer Neg Hx   . Rectal cancer Neg Hx   . Stomach cancer Neg Hx    Allergies  Allergen Reactions  . Cetirizine Hcl Other (See Comments)    Not effective  . Hydrocodone Itching    Have hypersensitivity to most pain meds  . Other   . Cephalexin Rash  . Penicillins Rash  . Sulfa Antibiotics Rash   Current Outpatient Medications  Medication  Sig Dispense Refill  . Ascorbic Acid (VITAMIN C PO) Take 500 mg by mouth daily.    Marland Kitchen aspirin EC 325 MG tablet Take 1 tablet (325 mg total) by mouth daily. 30 tablet 0  . Cholecalciferol (VITAMIN D-3) 125 MCG (5000 UT) TABS Take by mouth.    . dicyclomine (BENTYL) 20 MG tablet TAKE (1) TABLET BY MOUTH (3) TIMES DAILY. (Patient taking differently: Take 20 mg by mouth daily as needed for spasms.) 90 tablet 3  . ELDERBERRY PO Take 50 mg by mouth daily. Gummy    . fluticasone (FLONASE) 50 MCG/ACT nasal spray Place 1 spray into both nostrils daily as needed for allergies.    . hydrochlorothiazide (MICROZIDE) 12.5 MG capsule Take 12.5 mg by mouth daily.  1  . HYDROmorphone (DILAUDID) 2 MG tablet Take 1 tablet (2 mg total) by mouth every 4 (four) hours as needed for severe pain. 30 tablet 0  . levocetirizine (XYZAL) 5 MG tablet Take 5 mg by mouth every evening.    Marland Kitchen losartan (COZAAR) 50 MG tablet Take 50 mg by mouth daily.    Marland Kitchen saccharomyces boulardii  (FLORASTOR) 250 MG capsule Take 500 mg by mouth daily.    . vitamin E 180 MG (400 UNITS) capsule Take 400 Units by mouth daily.     No current facility-administered medications for this visit.   No results found.  Review of Systems:   A ROS was performed including pertinent positives and negatives as documented in the HPI.   Musculoskeletal Exam:    There were no vitals taken for this visit.  Left hip is well-healed.  Sensation is intact in all distributions.  Active range of motion of the left hip is 120 degrees of flexion with 20 degrees internal and external rotation..  Distal neurosensory exam is intact with 2+ dorsalis pedis pulse  Imaging:    None  I personally reviewed and interpreted the radiographs.   Assessment:   6 weeks status post left hip gluteus medius repair overall doing extremely well.  At this time I do that she is having some femoral acetabular type symptoms as result of the glue rehab.  To that effect we will plan for an ultrasound-guided femoral acetabular injection  Plan :    -Return to clinic in 6 weeks for reassessment     Procedure Note  Patient: Rhonda Porter             Date of Birth: 05/02/1971           MRN: [id number]             Visit Date: 09/14/2022  Procedures: Visit Diagnoses: No diagnosis found.  Large Joint Inj: L hip joint on 09/14/2022 12:10 PM Indications: pain Details: 22 G 3.5 in needle, ultrasound-guided anterolateral approach  Arthrogram: No  Medications: 4 mL lidocaine 1 %; 80 mg triamcinolone acetonide 40 MG/ML Outcome: tolerated well, no immediate complications Procedure, treatment alternatives, risks and benefits explained, specific risks discussed. Consent was given by the patient. Immediately prior to procedure a time out was called to verify the correct patient, procedure, equipment, support staff and site/side marked as required. Patient was prepped and draped in the usual sterile fashion.        I personally  saw and evaluated the patient, and participated in the management and treatment plan.  Vanetta Mulders, MD Attending Physician, Orthopedic Surgery  This document was dictated using Dragon voice recognition software. A reasonable attempt at proof reading has been  made to minimize errors.

## 2022-09-18 ENCOUNTER — Encounter (HOSPITAL_BASED_OUTPATIENT_CLINIC_OR_DEPARTMENT_OTHER): Payer: Self-pay | Admitting: Physical Therapy

## 2022-09-18 ENCOUNTER — Ambulatory Visit (HOSPITAL_BASED_OUTPATIENT_CLINIC_OR_DEPARTMENT_OTHER): Payer: BC Managed Care – PPO | Admitting: Physical Therapy

## 2022-09-18 DIAGNOSIS — M25552 Pain in left hip: Secondary | ICD-10-CM | POA: Diagnosis not present

## 2022-09-18 DIAGNOSIS — R262 Difficulty in walking, not elsewhere classified: Secondary | ICD-10-CM

## 2022-09-18 DIAGNOSIS — M6281 Muscle weakness (generalized): Secondary | ICD-10-CM

## 2022-09-18 NOTE — Therapy (Signed)
OUTPATIENT PHYSICAL THERAPY LOWER EXTREMITY   Patient Name: Rhonda Porter MRN: [id number] DOB:January 19, 1971, 51 y.o., female Today's Date: 09/18/2022   PT End of Session - 09/18/22 1444     Visit Number 7    Number of Visits 21    Date for PT Re-Evaluation 11/01/22    Authorization Type BCBS    PT Start Time 1444    PT Stop Time 1528    PT Time Calculation (min) 44 min    Activity Tolerance Patient tolerated treatment well;Patient limited by pain    Behavior During Therapy [facility] for tasks assessed/performed                   Past Medical History:  Diagnosis Date   Diverticula of colon    Ganglion cyst    Gastritis    History of colonic polyps    Hypertension    on 03/08/15 pt denies high bp in over 10 yrs    IBS (irritable bowel syndrome)    Melanoma ([place]) 10/29/2004   seasonal allergies    Past Surgical History:  Procedure Laterality Date   ABDOMINAL HYSTERECTOMY  10/29/1997   CARPAL TUNNEL RELEASE Right    COLONOSCOPY  03/08/2015   FOOT SURGERY Bilateral    surg x 3 right, surg x 1 left   GLUTEUS MINIMUS REPAIR Left 07/30/2022   Procedure: LEFT GLUTEUS MEDIUS REPAIR;  Surgeon: Vanetta Mulders, MD;  Location: [facility];  Service: Orthopedics;  Laterality: Left;   KNEE ARTHROSCOPY Right 10/30/2011   MELANOMA EXCISION  10/29/2004   left shoulder   SPINAL FUSION     UPPER GASTROINTESTINAL ENDOSCOPY  03/08/2015   Dr.Pyrtle   WISDOM TOOTH EXTRACTION     Patient Active Problem List   Diagnosis Date Noted   Tear of left gluteus medius tendon    Lower abdominal pain 04/11/2017   Menopausal symptom 04/11/2017   Pain in pelvis 04/11/2017   LUQ abdominal pain 02/10/2015   Bloating 02/08/2015   Irritable bowel syndrome (IBS) 02/08/2015   Abdominal cramps 02/08/2015   DIVERTICULAR BLEEDING, HX OF 01/18/2009   HYPERTENSION 01/17/2009   COLONIC POLYPS, HYPERPLASTIC, HX OF 01/17/2009    PCP: Lanelle Bal, PA-C  REFERRING PROVIDER: Vanetta Mulders, MD  REFERRING  DIAG: [phone] (ICD-10-CM) - Tear of left gluteus medius tendon, initial encounter  THERAPY DIAG:  Pain in left hip  Difficulty walking  Muscle weakness (generalized)  Rationale for Evaluation and Treatment Rehabilitation  ONSET DATE: Days since surgery: 50  07/30/2022  1.  Left hip gluteus medius minimus repair 2.  Left hip open trochanteric bursectomy  SUBJECTIVE:   SUBJECTIVE STATEMENT:  Pt reports she returned to work 1/2 days.  She states she knew she should have gotten up more frequently, but sat more than 1.5 hrs before getting up.  She is sore.  She still has trouble crossing LLE to don her sock.   She had an injection in Lt hip 11/17; "hip isn't catching as much"  She states she is thinking of joining gym.     Eval:  Pt presents today follow surgical repair. Pt states the pain is bad today. She had a better day yesterday. The first two days were very painful. She has been sleeping in the recliner. She has been using the RW at home but has crutches as well. Pt presents with husband today for assistance. Pt has been compliant with aspirin usage and has been compliant with TTWB precautions. Pt denies signs of infection.  PERTINENT HISTORY: R fibularis tendon rupture, 2012 melanoma; history of surgeries for R ankle fibularis tendons  PAIN:  Are you having pain? yes: NPRS scale: 3/10 Pain location: Lt glute, superior to ischial tuberosity Pain description: dull, sore Aggravating factors: moving Relieving factors: resting PRECAUTIONS: TTWB for 2 wks  WEIGHT BEARING RESTRICTIONS No  FALLS:  Has patient fallen in last 6 months? No  LIVING ENVIRONMENT: Lives with: lives with their family and lives with their spouse Lives in: House/apartment Stairs: No, only stairs on back deck Has following equipment at home: None  OCCUPATION: [employer], sitting 8 hours  PLOF: Independent with basic ADLs  PATIENT GOALS : Improve strength; return to normal    OBJECTIVE:    DIAGNOSTIC FINDINGS:  MRI  IMPRESSION: 1. Mild tendinosis of the left gluteus minimus tendon. Small partial-thickness tear of the left gluteus medius tendon insertion. 2. Limited evaluation of the labrum secondary lack of intra-articular fluid. Degeneration of the left labrum. 3. 4 cm right ovarian simple-appearing cyst, not adequately characterized. Recommend prompt follow-up with pelvic US. Reference: JACR 2020 Feb;17(2):248-254    PATIENT SURVEYS:  FOTO 1 48 @ DC 19 MCII   TODAY'S TREATMENT: 11/21 Pt seen for aquatic therapy today.  Treatment took place in water 3.25-4.5 ft in depth at the [facility]. Temp of water was 91.  Pt entered/exited the pool via stairs independently with bilat rail.  In 4 ft of water:  Unsupported - walking forward, backwards.  Side stepping with knees straight, and with increased step height.  Marching backwards; forward walking kicks (hip flex to LAQ)  Holding wall:  heel/toe raises x 10; hip ext x 5 x 2, hip abdct/ addct x 5 x 2; single leg clams x 10 Seated at bench in water with feet on blue step:  STS x10  At first step in water (60% submerged) forward step ups with LLE x 8 Rt forward step ups to exit pool with bilat rail  Pt requires the buoyancy and hydrostatic pressure of water for support, and to offload joints by unweighting joint load by at least 50 % in navel deep water and by at least 75-80% in chest to neck deep water.  Viscosity of the water is needed for resistance of strengthening. Water current perturbations provides challenge to standing balance requiring increased core activation.  * once dried off: applied reg Rock tape in x pattern over her Lt hip incision.  Tape not touching distal incision where scab is present.  Educated pt on safe tape removal technique.   11/8 Recumbent bike L1 seat 4 5 min  Exercises  STM to L adductor group and VMO, rec fem  -seated RTB hip ABD 3s  holds (start with small ROM) 10x   - Supine Piriformis Stretch with Towel  - 1 x daily - 7 x weekly - 1 sets - 10 reps - 5 hold -sidestepping at counter no band 61f x3 laps - Supine Bridge  - 1 x daily - 4-5 x weekly - 2 sets - 10 reps - Sit to Stand with RTB at knees  - 1 x daily - 4-5 x weekly - 3 sets - 5 reps   PATIENT EDUCATION:  Education details: aCareers adviser modifications  Person educated: Patient Education method: Explanation, Demonstration, Tactile cues, Verbal cues Education comprehension: verbalized understanding, returned demonstration, verbal cues required, and tactile cues required   HOME EXERCISE PROGRAM:  Access Code: [email or web address]: [email or web address] Date: 06/28/2022 Prepared by: ADaleen Bo ASSESSMENT:  CLINICAL IMPRESSION: Pt 7 wks at this time and able to begin aquatic exercises.  Most of exercises completed while pt 80% submerged with good tolerance and without pain.  Pt able to complete forward step ups with LLE while submerged 60% - became easier and she had more confidence with increased reps. Trial of reg Rock tape applied to incision to assist with desensitizing and scar mobilization.  Pt would benefit from continued skilled therapy in order to reach goals and maximize functional R LE strength and ROM for prevention of further functional decline.  Goals are ongoing.    OBJECTIVE IMPAIRMENTS Abnormal gait, decreased activity tolerance, decreased balance, decreased endurance, decreased mobility, difficulty walking, decreased ROM, decreased strength, hypomobility, increased fascial restrictions, increased muscle spasms, impaired flexibility, improper body mechanics, postural dysfunction, and pain.   ACTIVITY LIMITATIONS carrying, lifting, bending, sitting, standing, squatting, stairs, transfers, and locomotion level  PARTICIPATION LIMITATIONS: cleaning, laundry, interpersonal relationship, driving, shopping, community activity, occupation, and exercise  PERSONAL FACTORS  Age, Behavior pattern, Fitness, Past/current experiences, Time since onset of injury/illness/exacerbation, and 1-2 comorbidities:    are also affecting patient's functional outcome.   REHAB POTENTIAL: Fair    CLINICAL DECISION MAKING: Stable/uncomplicated  EVALUATION COMPLEXITY: Low   GOALS:   SHORT TERM GOALS: Target date: 09/14/22   Pt will become independent with HEP in order to demonstrate synthesis of PT education.   Goal status: INITIAL  2.  Pt will report at least 2 pt reduction on NPRS scale for pain in order to demonstrate functional improvement with household activity, self care, and ADL.   Goal status: Achieved - 09/18/22  3.  Pt will be able to demonstrate ability to descend stairs with reciprocal pattern and single UE in order to demonstrate functional improvement in LE function for self-care and house hold duties.   Goal status: Ongoing; trial into water with bilat rail with hesitation 09/18/22   4.  Pt will score at least 19 pt increase on FOTO to demonstrate functional improvement in MCII and pt perceived function.   Baseline:  Goal status: Ongoing    LONG TERM GOALS: 10/26/22 Pt  will become independent with final HEP in order to demonstrate synthesis of PT education. Goal status: INITIAL  2.  Pt will score >/= 48 on FOTO to demonstrate improvement in perceived L hip function. .   Goal status: INITIAL  3.  Pt will be able to demonstrate  full depth DL squat in order to demonstrate functional improvement in bilat LE strength for return to PLOF and exercise.   Goal status: INITIAL  4.  Pt will be able to demonstrate/report ability to walk >30 mins without pain or need for assistance in order to demonstrate functional improvement and tolerance to exercise and community mobility.  Goal status: INITIAL   PLAN: PT FREQUENCY: 1-2x/week  PT DURATION: 12 wks  PLANNED INTERVENTIONS: Therapeutic exercises, Therapeutic activity, Neuromuscular  re-education, Balance training, Gait training, Patient/Family education, Self Care, Joint mobilization, Joint manipulation, Stair training, Orthotic/Fit training, DME instructions, Aquatic Therapy, Dry Needling, Electrical stimulation, Spinal manipulation, Spinal mobilization, Cryotherapy, Moist heat, scar mobilization, Splintting, Taping, Vasopneumatic device, Traction, Ultrasound, Ionotophoresis '4mg'$ /ml Dexamethasone, Manual therapy, and Re-evaluation  PLAN FOR NEXT SESSION: aquatic intro, progress per glute med repair protocol, no ROM restrictions  - FOTO.   Kerin Perna, PTA 09/18/22 3:54 PM [facility] [address] [place], [place], [address] Phone: [phone]   Fax:  [phone]

## 2022-09-19 ENCOUNTER — Ambulatory Visit (HOSPITAL_BASED_OUTPATIENT_CLINIC_OR_DEPARTMENT_OTHER): Payer: BC Managed Care – PPO | Admitting: Physical Therapy

## 2022-10-01 ENCOUNTER — Ambulatory Visit (HOSPITAL_BASED_OUTPATIENT_CLINIC_OR_DEPARTMENT_OTHER): Payer: BC Managed Care – PPO | Attending: Orthopaedic Surgery | Admitting: Physical Therapy

## 2022-10-01 ENCOUNTER — Encounter (HOSPITAL_BASED_OUTPATIENT_CLINIC_OR_DEPARTMENT_OTHER): Payer: Self-pay | Admitting: Physical Therapy

## 2022-10-01 DIAGNOSIS — R262 Difficulty in walking, not elsewhere classified: Secondary | ICD-10-CM | POA: Insufficient documentation

## 2022-10-01 DIAGNOSIS — M25552 Pain in left hip: Secondary | ICD-10-CM | POA: Diagnosis present

## 2022-10-01 DIAGNOSIS — M6281 Muscle weakness (generalized): Secondary | ICD-10-CM | POA: Diagnosis present

## 2022-10-01 DIAGNOSIS — R600 Localized edema: Secondary | ICD-10-CM | POA: Insufficient documentation

## 2022-10-01 NOTE — Therapy (Signed)
OUTPATIENT PHYSICAL THERAPY LOWER EXTREMITY   Patient Name: Rhonda Porter MRN: [id number] DOB:Jan 21, 1971, 51 y.o., female Today's Date: 10/01/2022   PT End of Session - 10/01/22 1405     Visit Number 8    Number of Visits 21    Date for PT Re-Evaluation 11/01/22    Authorization Type BCBS    PT Start Time 1115    PT Stop Time 1200    PT Time Calculation (min) 45 min    Activity Tolerance Patient tolerated treatment well;Patient limited by pain    Behavior During Therapy [facility] for tasks assessed/performed                   Past Medical History:  Diagnosis Date   Diverticula of colon    Ganglion cyst    Gastritis    History of colonic polyps    Hypertension    on 03/08/15 pt denies high bp in over 10 yrs    IBS (irritable bowel syndrome)    Melanoma ([place]) 10/29/2004   seasonal allergies    Past Surgical History:  Procedure Laterality Date   ABDOMINAL HYSTERECTOMY  10/29/1997   CARPAL TUNNEL RELEASE Right    COLONOSCOPY  03/08/2015   FOOT SURGERY Bilateral    surg x 3 right, surg x 1 left   GLUTEUS MINIMUS REPAIR Left 07/30/2022   Procedure: LEFT GLUTEUS MEDIUS REPAIR;  Surgeon: Vanetta Mulders, MD;  Location: [facility];  Service: Orthopedics;  Laterality: Left;   KNEE ARTHROSCOPY Right 10/30/2011   MELANOMA EXCISION  10/29/2004   left shoulder   SPINAL FUSION     UPPER GASTROINTESTINAL ENDOSCOPY  03/08/2015   Dr.Pyrtle   WISDOM TOOTH EXTRACTION     Patient Active Problem List   Diagnosis Date Noted   Tear of left gluteus medius tendon    Lower abdominal pain 04/11/2017   Menopausal symptom 04/11/2017   Pain in pelvis 04/11/2017   LUQ abdominal pain 02/10/2015   Bloating 02/08/2015   Irritable bowel syndrome (IBS) 02/08/2015   Abdominal cramps 02/08/2015   DIVERTICULAR BLEEDING, HX OF 01/18/2009   HYPERTENSION 01/17/2009   COLONIC POLYPS, HYPERPLASTIC, HX OF 01/17/2009    PCP: Lanelle Bal, PA-C  REFERRING PROVIDER: Vanetta Mulders, MD  REFERRING  DIAG: [phone] (ICD-10-CM) - Tear of left gluteus medius tendon, initial encounter  THERAPY DIAG:  Pain in left hip  Difficulty walking  Muscle weakness (generalized)  Rationale for Evaluation and Treatment Rehabilitation  ONSET DATE: Days since surgery: 63  07/30/2022  1.  Left hip gluteus medius minimus repair 2.  Left hip open trochanteric bursectomy  SUBJECTIVE:   SUBJECTIVE STATEMENT: Pt reports increase in right buttocck pain at ischial tuberocity with extended sitting working full days. Pain impeding sleep. 7/10 after working/sitting all day    Eval:  Pt presents today follow surgical repair. Pt states the pain is bad today. She had a better day yesterday. The first two days were very painful. She has been sleeping in the recliner. She has been using the RW at home but has crutches as well. Pt presents with husband today for assistance. Pt has been compliant with aspirin usage and has been compliant with TTWB precautions. Pt denies signs of infection.    PERTINENT HISTORY: R fibularis tendon rupture, 2012 melanoma; history of surgeries for R ankle fibularis tendons  PAIN:  Are you having pain? yes: NPRS scale: 4/10 Pain location: Lt glute, superior to ischial tuberosity Pain description: dull, sore Aggravating factors: moving Relieving  factors: resting PRECAUTIONS: TTWB for 2 wks  WEIGHT BEARING RESTRICTIONS No  FALLS:  Has patient fallen in last 6 months? No  LIVING ENVIRONMENT: Lives with: lives with their family and lives with their spouse Lives in: House/apartment Stairs: No, only stairs on back deck Has following equipment at home: None  OCCUPATION: [employer], sitting 8 hours  PLOF: Independent with basic ADLs  PATIENT GOALS : Improve strength; return to normal    OBJECTIVE:   DIAGNOSTIC FINDINGS:  MRI  IMPRESSION: 1. Mild tendinosis of the left gluteus minimus tendon. Small partial-thickness tear of the left gluteus medius tendon insertion. 2.  Limited evaluation of the labrum secondary lack of intra-articular fluid. Degeneration of the left labrum. 3. 4 cm right ovarian simple-appearing cyst, not adequately characterized. Recommend prompt follow-up with pelvic US. Reference: JACR 2020 Feb;17(2):248-254    PATIENT SURVEYS:  FOTO 1 48 @ DC 19 MCII   TODAY'S TREATMENT: 12/4 Pt seen for aquatic therapy today.  Treatment took place in water 3.25-4.5 ft in depth at the [facility]. Temp of water was 91.  Pt entered/exited the pool via stairs independently with bilat rail.  In 4 ft of water:  Unsupported - walking forward, backwards. Long leg kicking  Gastroc stretch Holding wall:  squats 2 x10 with isometric glut tightening; heel/toe raises x 10; hip ext x 10, hip abdct/ addct x 10, single leg clams x 10 -groin stretch SLS left unsupported - with pink fin cuff on rle for left glut firing movement in frontal and sagittal plane Standing bottom step hip hiking R/L x10    Pt requires the buoyancy and hydrostatic pressure of water for support, and to offload joints by unweighting joint load by at least 50 % in navel deep water and by at least 75-80% in chest to neck deep water.  Viscosity of the water is needed for resistance of strengthening. Water current perturbations provides challenge to standing balance requiring increased core activation.  * once dried off: applied reg Rock tape in x pattern over her Lt hip incision.  Tape not touching distal incision where scab is present.  Educated pt on safe tape removal technique.   11/8 Recumbent bike L1 seat 4 5 min  Exercises  STM to L adductor group and VMO, rec fem  -seated RTB hip ABD 3s  holds (start with small ROM) 10x  - Supine Piriformis Stretch with Towel  - 1 x daily - 7 x weekly - 1 sets - 10 reps - 5 hold -sidestepping at counter no band 57f x3 laps - Supine Bridge  - 1 x daily - 4-5 x weekly - 2 sets - 10 reps - Sit to Stand with RTB at knees  - 1  x daily - 4-5 x weekly - 3 sets - 5 reps   PATIENT EDUCATION:  Education details: aCareers adviser modifications  Person educated: Patient Education method: Explanation, Demonstration, Tactile cues, Verbal cues Education comprehension: verbalized understanding, returned demonstration, verbal cues required, and tactile cues required   HOME EXERCISE PROGRAM:  Access Code: [email or web address]: [email or web address] Date: 06/28/2022 Prepared by: ADaleen Bo ASSESSMENT:  CLINICAL IMPRESSION: Pt with increased discomfort in groin and ischial tuberosity area due to increased time sitting and working.  She is instructed to get up hourly to relieve pressure and work standing if able. She will also try to fashion some sort of donut/pressure relief cushoin to see if that helps.  Focus today on glut med strengthening, increasing proprioceptive input and  increased core and glut firing.  She tolerates fair.    OBJECTIVE IMPAIRMENTS Abnormal gait, decreased activity tolerance, decreased balance, decreased endurance, decreased mobility, difficulty walking, decreased ROM, decreased strength, hypomobility, increased fascial restrictions, increased muscle spasms, impaired flexibility, improper body mechanics, postural dysfunction, and pain.   ACTIVITY LIMITATIONS carrying, lifting, bending, sitting, standing, squatting, stairs, transfers, and locomotion level  PARTICIPATION LIMITATIONS: cleaning, laundry, interpersonal relationship, driving, shopping, community activity, occupation, and exercise  PERSONAL FACTORS Age, Behavior pattern, Fitness, Past/current experiences, Time since onset of injury/illness/exacerbation, and 1-2 comorbidities:    are also affecting patient's functional outcome.   REHAB POTENTIAL: Fair    CLINICAL DECISION MAKING: Stable/uncomplicated  EVALUATION COMPLEXITY: Low   GOALS:   SHORT TERM GOALS: Target date: 09/14/22   Pt will become independent with HEP in  order to demonstrate synthesis of PT education.   Goal status: INITIAL  2.  Pt will report at least 2 pt reduction on NPRS scale for pain in order to demonstrate functional improvement with household activity, self care, and ADL.   Goal status: Achieved - 09/18/22  3.  Pt will be able to demonstrate ability to descend stairs with reciprocal pattern and single UE in order to demonstrate functional improvement in LE function for self-care and house hold duties.   Goal status: Ongoing; trial into water with bilat rail with hesitation 09/18/22   4.  Pt will score at least 19 pt increase on FOTO to demonstrate functional improvement in MCII and pt perceived function.   Baseline:  Goal status: Ongoing    LONG TERM GOALS: 10/26/22 Pt  will become independent with final HEP in order to demonstrate synthesis of PT education. Goal status: INITIAL  2.  Pt will score >/= 48 on FOTO to demonstrate improvement in perceived L hip function. .   Goal status: INITIAL  3.  Pt will be able to demonstrate  full depth DL squat in order to demonstrate functional improvement in bilat LE strength for return to PLOF and exercise.   Goal status: INITIAL  4.  Pt will be able to demonstrate/report ability to walk >30 mins without pain or need for assistance in order to demonstrate functional improvement and tolerance to exercise and community mobility.  Goal status: INITIAL   PLAN: PT FREQUENCY: 1-2x/week  PT DURATION: 12 wks  PLANNED INTERVENTIONS: Therapeutic exercises, Therapeutic activity, Neuromuscular re-education, Balance training, Gait training, Patient/Family education, Self Care, Joint mobilization, Joint manipulation, Stair training, Orthotic/Fit training, DME instructions, Aquatic Therapy, Dry Needling, Electrical stimulation, Spinal manipulation, Spinal mobilization, Cryotherapy, Moist heat, scar mobilization, Splintting, Taping, Vasopneumatic device, Traction, Ultrasound, Ionotophoresis  '4mg'$ /ml Dexamethasone, Manual therapy, and Re-evaluation  PLAN FOR NEXT SESSION: aquatic intro, progress per glute med repair protocol, no ROM restrictions  - FOTO.   Stanton Kidney [place]) Taylormarie Register MPT 10/01/22 2:32 PM [facility] [address] [place], [place], [address] Phone: [phone]   Fax:  [phone]

## 2022-10-03 ENCOUNTER — Ambulatory Visit (HOSPITAL_BASED_OUTPATIENT_CLINIC_OR_DEPARTMENT_OTHER): Payer: BC Managed Care – PPO | Admitting: Physical Therapy

## 2022-10-05 ENCOUNTER — Encounter (HOSPITAL_BASED_OUTPATIENT_CLINIC_OR_DEPARTMENT_OTHER): Payer: Self-pay | Admitting: Physical Therapy

## 2022-10-05 ENCOUNTER — Ambulatory Visit (HOSPITAL_BASED_OUTPATIENT_CLINIC_OR_DEPARTMENT_OTHER): Payer: BC Managed Care – PPO | Admitting: Physical Therapy

## 2022-10-05 DIAGNOSIS — R262 Difficulty in walking, not elsewhere classified: Secondary | ICD-10-CM

## 2022-10-05 DIAGNOSIS — M6281 Muscle weakness (generalized): Secondary | ICD-10-CM

## 2022-10-05 DIAGNOSIS — M25552 Pain in left hip: Secondary | ICD-10-CM

## 2022-10-05 NOTE — Therapy (Signed)
OUTPATIENT PHYSICAL THERAPY LOWER EXTREMITY   Patient Name: Rhonda Porter MRN: [id number] DOB:1971-07-29, 51 y.o., female Today's Date: 10/05/2022   PT End of Session - 10/05/22 0811     Visit Number 9    Number of Visits 21    Date for PT Re-Evaluation 11/01/22    Authorization Type BCBS    PT Start Time 0815    PT Stop Time 0900    PT Time Calculation (min) 45 min    Activity Tolerance Patient tolerated treatment well;Patient limited by pain    Behavior During Therapy [facility] for tasks assessed/performed                   Past Medical History:  Diagnosis Date   Diverticula of colon    Ganglion cyst    Gastritis    History of colonic polyps    Hypertension    on 03/08/15 pt denies high bp in over 10 yrs    IBS (irritable bowel syndrome)    Melanoma ([place]) 10/29/2004   seasonal allergies    Past Surgical History:  Procedure Laterality Date   ABDOMINAL HYSTERECTOMY  10/29/1997   CARPAL TUNNEL RELEASE Right    COLONOSCOPY  03/08/2015   FOOT SURGERY Bilateral    surg x 3 right, surg x 1 left   GLUTEUS MINIMUS REPAIR Left 07/30/2022   Procedure: LEFT GLUTEUS MEDIUS REPAIR;  Surgeon: Vanetta Mulders, MD;  Location: [place];  Service: Orthopedics;  Laterality: Left;   KNEE ARTHROSCOPY Right 10/30/2011   MELANOMA EXCISION  10/29/2004   left shoulder   SPINAL FUSION     UPPER GASTROINTESTINAL ENDOSCOPY  03/08/2015   Dr.Pyrtle   WISDOM TOOTH EXTRACTION     Patient Active Problem List   Diagnosis Date Noted   Tear of left gluteus medius tendon    Lower abdominal pain 04/11/2017   Menopausal symptom 04/11/2017   Pain in pelvis 04/11/2017   LUQ abdominal pain 02/10/2015   Bloating 02/08/2015   Irritable bowel syndrome (IBS) 02/08/2015   Abdominal cramps 02/08/2015   DIVERTICULAR BLEEDING, HX OF 01/18/2009   HYPERTENSION 01/17/2009   COLONIC POLYPS, HYPERPLASTIC, HX OF 01/17/2009    PCP: Lanelle Bal, PA-C  REFERRING PROVIDER: Vanetta Mulders, MD  REFERRING  DIAG: [phone] (ICD-10-CM) - Tear of left gluteus medius tendon, initial encounter  THERAPY DIAG:  Pain in left hip  Difficulty walking  Muscle weakness (generalized)  Rationale for Evaluation and Treatment Rehabilitation  ONSET DATE: Days since surgery: 63  07/30/2022  1.  Left hip gluteus medius minimus repair 2.  Left hip open trochanteric bursectomy  SUBJECTIVE:   SUBJECTIVE STATEMENT: Pt reports slight decrease in ischial pain after she has fashioned a cushion to sit on when working.  Felt good after last session    Eval:  Pt presents today follow surgical repair. Pt states the pain is bad today. She had a better day yesterday. The first two days were very painful. She has been sleeping in the recliner. She has been using the RW at home but has crutches as well. Pt presents with husband today for assistance. Pt has been compliant with aspirin usage and has been compliant with TTWB precautions. Pt denies signs of infection.    PERTINENT HISTORY: R fibularis tendon rupture, 2012 melanoma; history of surgeries for R ankle fibularis tendons  PAIN:  Are you having pain? yes: NPRS scale: 4/10 Pain location: Lt glute, superior to ischial tuberosity Pain description: dull, sore Aggravating factors: moving Relieving  factors: resting PRECAUTIONS: TTWB for 2 wks  WEIGHT BEARING RESTRICTIONS No  FALLS:  Has patient fallen in last 6 months? No  LIVING ENVIRONMENT: Lives with: lives with their family and lives with their spouse Lives in: House/apartment Stairs: No, only stairs on back deck Has following equipment at home: None  OCCUPATION: [place], sitting 8 hours  PLOF: Independent with basic ADLs  PATIENT GOALS : Improve strength; return to normal    OBJECTIVE:   DIAGNOSTIC FINDINGS:  MRI  IMPRESSION: 1. Mild tendinosis of the left gluteus minimus tendon. Small partial-thickness tear of the left gluteus medius tendon insertion. 2. Limited evaluation of the labrum  secondary lack of intra-articular fluid. Degeneration of the left labrum. 3. 4 cm right ovarian simple-appearing cyst, not adequately characterized. Recommend prompt follow-up with pelvic US. Reference: JACR 2020 Feb;17(2):248-254    PATIENT SURVEYS:  FOTO 1 48 @ DC 19 MCII   TODAY'S TREATMENT: 12/8  Land based: Manual: stretching and deep pressure massage   Pt seen for aquatic therapy today.  Treatment took place in water 3.25-4.5 ft in depth at the [facility]. Temp of water was 91.  Pt entered/exited the pool via stairs independently with bilat rail.  In 4 ft of water:  Unsupported - walking forward, backwards. Long leg kicking  Gastroc stretch Holding wall:  squats 2 x10, hip openers, hurdles SLS left supported by yellow hand buoys - right le oscillating in frontal and sagittal plane for left glut firing movement   Pt requires the buoyancy and hydrostatic pressure of water for support, and to offload joints by unweighting joint load by at least 50 % in navel deep water and by at least 75-80% in chest to neck deep water.  Viscosity of the water is needed for resistance of strengthening. Water current perturbations provides challenge to standing balance requiring increased core activation.    11/8 Recumbent bike L1 seat 4 5 min  Exercises  STM to L adductor group and VMO, rec fem  -seated RTB hip ABD 3s  holds (start with small ROM) 10x  - Supine Piriformis Stretch with Towel  - 1 x daily - 7 x weekly - 1 sets - 10 reps - 5 hold -sidestepping at counter no band 36f x3 laps - Supine Bridge  - 1 x daily - 4-5 x weekly - 2 sets - 10 reps - Sit to Stand with RTB at knees  - 1 x daily - 4-5 x weekly - 3 sets - 5 reps   PATIENT EDUCATION:  Education details: aCareers adviser modifications  Person educated: Patient Education method: Explanation, Demonstration, Tactile cues, Verbal cues Education comprehension: verbalized understanding, returned  demonstration, verbal cues required, and tactile cues required   HOME EXERCISE PROGRAM:  Access Code: [email or web address]: [email or web address] Date: 06/28/2022 Prepared by: ADaleen Bo 10/05/22 Pt instructed on self trigger point pressure .  Added hip flex stretching  ASSESSMENT:  CLINICAL IMPRESSION: Pt with complaints of sleep interruption due to pain in left groin and hip flex. Manual technique to decrease ms tightness and stretching land based relieves.  Pt instructed to complete stretches and give direct pressure to both areas at home. She demonstrates and VU.  Focues on glut firing for strength and balance water based.  She finishes in JBallwith water massage (unbilled)  She tolerates session well      OBJECTIVE IMPAIRMENTS Abnormal gait, decreased activity tolerance, decreased balance, decreased endurance, decreased mobility, difficulty walking, decreased ROM, decreased strength, hypomobility, increased fascial  restrictions, increased muscle spasms, impaired flexibility, improper body mechanics, postural dysfunction, and pain.   ACTIVITY LIMITATIONS carrying, lifting, bending, sitting, standing, squatting, stairs, transfers, and locomotion level  PARTICIPATION LIMITATIONS: cleaning, laundry, interpersonal relationship, driving, shopping, community activity, occupation, and exercise  PERSONAL FACTORS Age, Behavior pattern, Fitness, Past/current experiences, Time since onset of injury/illness/exacerbation, and 1-2 comorbidities:    are also affecting patient's functional outcome.   REHAB POTENTIAL: Fair    CLINICAL DECISION MAKING: Stable/uncomplicated  EVALUATION COMPLEXITY: Low   GOALS:   SHORT TERM GOALS: Target date: 09/14/22   Pt will become independent with HEP in order to demonstrate synthesis of PT education.   Goal status: INITIAL  2.  Pt will report at least 2 pt reduction on NPRS scale for pain in order to demonstrate functional improvement  with household activity, self care, and ADL.   Goal status: Achieved - 09/18/22  3.  Pt will be able to demonstrate ability to descend stairs with reciprocal pattern and single UE in order to demonstrate functional improvement in LE function for self-care and house hold duties.   Goal status: Ongoing; trial into water with bilat rail with hesitation 09/18/22   4.  Pt will score at least 19 pt increase on FOTO to demonstrate functional improvement in MCII and pt perceived function.   Baseline:  Goal status: Ongoing    LONG TERM GOALS: 10/26/22 Pt  will become independent with final HEP in order to demonstrate synthesis of PT education. Goal status: INITIAL  2.  Pt will score >/= 48 on FOTO to demonstrate improvement in perceived L hip function. .   Goal status: INITIAL  3.  Pt will be able to demonstrate  full depth DL squat in order to demonstrate functional improvement in bilat LE strength for return to PLOF and exercise.   Goal status: INITIAL  4.  Pt will be able to demonstrate/report ability to walk >30 mins without pain or need for assistance in order to demonstrate functional improvement and tolerance to exercise and community mobility.  Goal status: INITIAL   PLAN: PT FREQUENCY: 1-2x/week  PT DURATION: 12 wks  PLANNED INTERVENTIONS: Therapeutic exercises, Therapeutic activity, Neuromuscular re-education, Balance training, Gait training, Patient/Family education, Self Care, Joint mobilization, Joint manipulation, Stair training, Orthotic/Fit training, DME instructions, Aquatic Therapy, Dry Needling, Electrical stimulation, Spinal manipulation, Spinal mobilization, Cryotherapy, Moist heat, scar mobilization, Splintting, Taping, Vasopneumatic device, Traction, Ultrasound, Ionotophoresis '4mg'$ /ml Dexamethasone, Manual therapy, and Re-evaluation  PLAN FOR NEXT SESSION: aquatic intro, progress per glute med repair protocol, no ROM restrictions  - FOTO.   Stanton Kidney [place]) Zacharia Sowles  MPT 10/05/22 8:47 AM [facility] [address] [place], [place], [address] Phone: [phone]   Fax:  [phone]

## 2022-10-09 ENCOUNTER — Ambulatory Visit (HOSPITAL_BASED_OUTPATIENT_CLINIC_OR_DEPARTMENT_OTHER): Payer: BC Managed Care – PPO | Admitting: Physical Therapy

## 2022-10-11 ENCOUNTER — Encounter (HOSPITAL_BASED_OUTPATIENT_CLINIC_OR_DEPARTMENT_OTHER): Payer: Self-pay | Admitting: Physical Therapy

## 2022-10-11 ENCOUNTER — Ambulatory Visit (HOSPITAL_BASED_OUTPATIENT_CLINIC_OR_DEPARTMENT_OTHER): Payer: BC Managed Care – PPO | Admitting: Physical Therapy

## 2022-10-11 DIAGNOSIS — M25552 Pain in left hip: Secondary | ICD-10-CM | POA: Diagnosis not present

## 2022-10-11 DIAGNOSIS — M6281 Muscle weakness (generalized): Secondary | ICD-10-CM

## 2022-10-11 DIAGNOSIS — R262 Difficulty in walking, not elsewhere classified: Secondary | ICD-10-CM

## 2022-10-11 DIAGNOSIS — R600 Localized edema: Secondary | ICD-10-CM

## 2022-10-11 NOTE — Therapy (Signed)
OUTPATIENT PHYSICAL THERAPY LOWER EXTREMITY   Patient Name: Rhonda Porter MRN: [id number] DOB:1971/05/25, 51 y.o., female Today's Date: 10/11/2022   PT End of Session - 10/11/22 1050     Visit Number 10    Number of Visits 21    Date for PT Re-Evaluation 11/01/22    Authorization Type BCBS    PT Start Time 1031    PT Stop Time 1115    PT Time Calculation (min) 44 min    Activity Tolerance Patient tolerated treatment well;Patient limited by pain    Behavior During Therapy [facility] for tasks assessed/performed                    Past Medical History:  Diagnosis Date   Diverticula of colon    Ganglion cyst    Gastritis    History of colonic polyps    Hypertension    on 03/08/15 pt denies high bp in over 10 yrs    IBS (irritable bowel syndrome)    Melanoma ([place]) 10/29/2004   seasonal allergies    Past Surgical History:  Procedure Laterality Date   ABDOMINAL HYSTERECTOMY  10/29/1997   CARPAL TUNNEL RELEASE Right    COLONOSCOPY  03/08/2015   FOOT SURGERY Bilateral    surg x 3 right, surg x 1 left   GLUTEUS MINIMUS REPAIR Left 07/30/2022   Procedure: LEFT GLUTEUS MEDIUS REPAIR;  Surgeon: Vanetta Mulders, MD;  Location: [place];  Service: Orthopedics;  Laterality: Left;   KNEE ARTHROSCOPY Right 10/30/2011   MELANOMA EXCISION  10/29/2004   left shoulder   SPINAL FUSION     UPPER GASTROINTESTINAL ENDOSCOPY  03/08/2015   Dr.Pyrtle   WISDOM TOOTH EXTRACTION     Patient Active Problem List   Diagnosis Date Noted   Tear of left gluteus medius tendon    Lower abdominal pain 04/11/2017   Menopausal symptom 04/11/2017   Pain in pelvis 04/11/2017   LUQ abdominal pain 02/10/2015   Bloating 02/08/2015   Irritable bowel syndrome (IBS) 02/08/2015   Abdominal cramps 02/08/2015   DIVERTICULAR BLEEDING, HX OF 01/18/2009   HYPERTENSION 01/17/2009   COLONIC POLYPS, HYPERPLASTIC, HX OF 01/17/2009    PCP: Lanelle Bal, PA-C  REFERRING PROVIDER: Vanetta Mulders,  MD  REFERRING DIAG: [phone] (ICD-10-CM) - Tear of left gluteus medius tendon, initial encounter  THERAPY DIAG:  Pain in left hip  Difficulty walking  Muscle weakness (generalized)  Localized edema  Rationale for Evaluation and Treatment Rehabilitation  ONSET DATE: Days since surgery: 63  07/30/2022  1.  Left hip gluteus medius minimus repair 2.  Left hip open trochanteric bursectomy  SUBJECTIVE:   SUBJECTIVE STATEMENT: Pt reports groin feels better but hip flex continues to hurt.  Sat too long yesterday at work causing increased ischial pain. "My own fault"    Eval:  Pt presents today follow surgical repair. Pt states the pain is bad today. She had a better day yesterday. The first two days were very painful. She has been sleeping in the recliner. She has been using the RW at home but has crutches as well. Pt presents with husband today for assistance. Pt has been compliant with aspirin usage and has been compliant with TTWB precautions. Pt denies signs of infection.    PERTINENT HISTORY: R fibularis tendon rupture, 2012 melanoma; history of surgeries for R ankle fibularis tendons  PAIN:  Are you having pain? yes: NPRS scale: 4/10 Pain location: Lt glute, superior to ischial tuberosity Pain description: dull,  sore Aggravating factors: moving Relieving factors: resting PRECAUTIONS: TTWB for 2 wks  WEIGHT BEARING RESTRICTIONS No  FALLS:  Has patient fallen in last 6 months? No  LIVING ENVIRONMENT: Lives with: lives with their family and lives with their spouse Lives in: House/apartment Stairs: No, only stairs on back deck Has following equipment at home: None  OCCUPATION: [place], sitting 8 hours  PLOF: Independent with basic ADLs  PATIENT GOALS : Improve strength; return to normal    OBJECTIVE:   DIAGNOSTIC FINDINGS:  MRI  IMPRESSION: 1. Mild tendinosis of the left gluteus minimus tendon. Small partial-thickness tear of the left gluteus medius tendon  insertion. 2. Limited evaluation of the labrum secondary lack of intra-articular fluid. Degeneration of the left labrum. 3. 4 cm right ovarian simple-appearing cyst, not adequately characterized. Recommend prompt follow-up with pelvic US. Reference: JACR 2020 Feb;17(2):248-254    PATIENT SURVEYS:  FOTO 1 48 @ DC 19 MCII   TODAY'S TREATMENT:  Pt seen for aquatic therapy today.  Treatment took place in water 3.25-4.5 ft in depth at the [facility]. Temp of water was 91.  Pt entered/exited the pool via stairs independently with bilat rail.  In 4 ft of water:  Unsupported - walking forward, backwards. Long leg kicking  Gastroc stretch; hip ER and piriformis stretch Quad and hip flex stretch at bench Holding wall:  squats 2 x10, hip openers, hurdles SLS left supported by yellow hand buoys - right le oscillating in frontal and sagittal plane for left glut firing movement  UE supported on wall: squats; curtsy squats  Pt requires the buoyancy and hydrostatic pressure of water for support, and to offload joints by unweighting joint load by at least 50 % in navel deep water and by at least 75-80% in chest to neck deep water.  Viscosity of the water is needed for resistance of strengthening. Water current perturbations provides challenge to standing balance requiring increased core activation.    11/8 Recumbent bike L1 seat 4 5 min  Exercises  STM to L adductor group and VMO, rec fem  -seated RTB hip ABD 3s  holds (start with small ROM) 10x  - Supine Piriformis Stretch with Towel  - 1 x daily - 7 x weekly - 1 sets - 10 reps - 5 hold -sidestepping at counter no band 56f x3 laps - Supine Bridge  - 1 x daily - 4-5 x weekly - 2 sets - 10 reps - Sit to Stand with RTB at knees  - 1 x daily - 4-5 x weekly - 3 sets - 5 reps   PATIENT EDUCATION:  Education details: aCareers adviser modifications  Person educated: Patient Education method: Explanation, Demonstration,  Tactile cues, Verbal cues Education comprehension: verbalized understanding, returned demonstration, verbal cues required, and tactile cues required   HOME EXERCISE PROGRAM:  Access Code: [email or web address]: [email or web address] Date: 06/28/2022 Prepared by: ADaleen Bo 10/05/22 Pt instructed on self trigger point pressure .  Added hip flex stretching  ASSESSMENT:  CLINICAL IMPRESSION: Pt is 74 days post surgery. Less pain groin with completion of added direct pressure as last visit.  Sleeping better. Progressed pt as per protocol. She tolerates fair, is limited some by left hip flex discomfort.  Able to effectively stretch with reduction in pain in area. Goals ongoing   OBJECTIVE IMPAIRMENTS Abnormal gait, decreased activity tolerance, decreased balance, decreased endurance, decreased mobility, difficulty walking, decreased ROM, decreased strength, hypomobility, increased fascial restrictions, increased muscle spasms, impaired flexibility, improper body mechanics, postural  dysfunction, and pain.   ACTIVITY LIMITATIONS carrying, lifting, bending, sitting, standing, squatting, stairs, transfers, and locomotion level  PARTICIPATION LIMITATIONS: cleaning, laundry, interpersonal relationship, driving, shopping, community activity, occupation, and exercise  PERSONAL FACTORS Age, Behavior pattern, Fitness, Past/current experiences, Time since onset of injury/illness/exacerbation, and 1-2 comorbidities:    are also affecting patient's functional outcome.   REHAB POTENTIAL: Fair    CLINICAL DECISION MAKING: Stable/uncomplicated  EVALUATION COMPLEXITY: Low   GOALS:   SHORT TERM GOALS: Target date: 09/14/22   Pt will become independent with HEP in order to demonstrate synthesis of PT education.   Goal status: Met 10/11/22  2.  Pt will report at least 2 pt reduction on NPRS scale for pain in order to demonstrate functional improvement with household activity, self care, and  ADL.   Goal status: Achieved - 09/18/22  3.  Pt will be able to demonstrate ability to descend stairs with reciprocal pattern and single UE in order to demonstrate functional improvement in LE function for self-care and house hold duties.   Goal status: Ongoing; trial into water with bilat rail with hesitation 09/18/22   4.  Pt will score at least 19 pt increase on FOTO to demonstrate functional improvement in MCII and pt perceived function.   Baseline:  Goal status: Ongoing    LONG TERM GOALS: 10/26/22 Pt  will become independent with final HEP in order to demonstrate synthesis of PT education. Goal status: INITIAL  2.  Pt will score >/= 48 on FOTO to demonstrate improvement in perceived L hip function. .   Goal status: INITIAL  3.  Pt will be able to demonstrate  full depth DL squat in order to demonstrate functional improvement in bilat LE strength for return to PLOF and exercise.   Goal status: INITIAL  4.  Pt will be able to demonstrate/report ability to walk >30 mins without pain or need for assistance in order to demonstrate functional improvement and tolerance to exercise and community mobility.  Goal status: INITIAL   PLAN: PT FREQUENCY: 1-2x/week  PT DURATION: 12 wks  PLANNED INTERVENTIONS: Therapeutic exercises, Therapeutic activity, Neuromuscular re-education, Balance training, Gait training, Patient/Family education, Self Care, Joint mobilization, Joint manipulation, Stair training, Orthotic/Fit training, DME instructions, Aquatic Therapy, Dry Needling, Electrical stimulation, Spinal manipulation, Spinal mobilization, Cryotherapy, Moist heat, scar mobilization, Splintting, Taping, Vasopneumatic device, Traction, Ultrasound, Ionotophoresis 38m/ml Dexamethasone, Manual therapy, and Re-evaluation  PLAN FOR NEXT SESSION: aquatic intro, progress per glute med repair protocol, no ROM restrictions  - FOTO.   MStanton Kidney([place] Jamekia Gannett MPT 10/11/22 1:03 PM [facility] [address] [place] [address]Phone: [phone]  Fax:  [phone]

## 2022-10-14 NOTE — Therapy (Signed)
OUTPATIENT PHYSICAL THERAPY LOWER EXTREMITY   Patient Name: Rhonda Porter MRN: [id number] DOB:1971/05/25, 51 y.o., female Today's Date: 10/11/2022   PT End of Session - 10/11/22 1050     Visit Number 10    Number of Visits 21    Date for PT Re-Evaluation 11/01/22    Authorization Type BCBS    PT Start Time 1031    PT Stop Time 1115    PT Time Calculation (min) 44 min    Activity Tolerance Patient tolerated treatment well;Patient limited by pain    Behavior During Therapy [facility] for tasks assessed/performed                    Past Medical History:  Diagnosis Date   Diverticula of colon    Ganglion cyst    Gastritis    History of colonic polyps    Hypertension    on 03/08/15 pt denies high bp in over 10 yrs    IBS (irritable bowel syndrome)    Melanoma ([place]) 10/29/2004   seasonal allergies    Past Surgical History:  Procedure Laterality Date   ABDOMINAL HYSTERECTOMY  10/29/1997   CARPAL TUNNEL RELEASE Right    COLONOSCOPY  03/08/2015   FOOT SURGERY Bilateral    surg x 3 right, surg x 1 left   GLUTEUS MINIMUS REPAIR Left 07/30/2022   Procedure: LEFT GLUTEUS MEDIUS REPAIR;  Surgeon: Vanetta Mulders, MD;  Location: [place];  Service: Orthopedics;  Laterality: Left;   KNEE ARTHROSCOPY Right 10/30/2011   MELANOMA EXCISION  10/29/2004   left shoulder   SPINAL FUSION     UPPER GASTROINTESTINAL ENDOSCOPY  03/08/2015   Dr.Pyrtle   WISDOM TOOTH EXTRACTION     Patient Active Problem List   Diagnosis Date Noted   Tear of left gluteus medius tendon    Lower abdominal pain 04/11/2017   Menopausal symptom 04/11/2017   Pain in pelvis 04/11/2017   LUQ abdominal pain 02/10/2015   Bloating 02/08/2015   Irritable bowel syndrome (IBS) 02/08/2015   Abdominal cramps 02/08/2015   DIVERTICULAR BLEEDING, HX OF 01/18/2009   HYPERTENSION 01/17/2009   COLONIC POLYPS, HYPERPLASTIC, HX OF 01/17/2009    PCP: Lanelle Bal, PA-C  REFERRING PROVIDER: Vanetta Mulders,  MD  REFERRING DIAG: [phone] (ICD-10-CM) - Tear of left gluteus medius tendon, initial encounter  THERAPY DIAG:  Pain in left hip  Difficulty walking  Muscle weakness (generalized)  Localized edema  Rationale for Evaluation and Treatment Rehabilitation  ONSET DATE: Days since surgery: 63  07/30/2022  1.  Left hip gluteus medius minimus repair 2.  Left hip open trochanteric bursectomy  SUBJECTIVE:   SUBJECTIVE STATEMENT: Pt reports groin feels better but hip flex continues to hurt.  Sat too long yesterday at work causing increased ischial pain. "My own fault"    Eval:  Pt presents today follow surgical repair. Pt states the pain is bad today. She had a better day yesterday. The first two days were very painful. She has been sleeping in the recliner. She has been using the RW at home but has crutches as well. Pt presents with husband today for assistance. Pt has been compliant with aspirin usage and has been compliant with TTWB precautions. Pt denies signs of infection.    PERTINENT HISTORY: R fibularis tendon rupture, 2012 melanoma; history of surgeries for R ankle fibularis tendons  PAIN:  Are you having pain? yes: NPRS scale: 4/10 Pain location: Lt glute, superior to ischial tuberosity Pain description: dull,  sore Aggravating factors: moving Relieving factors: resting PRECAUTIONS: TTWB for 2 wks  WEIGHT BEARING RESTRICTIONS No  FALLS:  Has patient fallen in last 6 months? No  LIVING ENVIRONMENT: Lives with: lives with their family and lives with their spouse Lives in: House/apartment Stairs: No, only stairs on back deck Has following equipment at home: None  OCCUPATION: [employer], sitting 8 hours  PLOF: Independent with basic ADLs  PATIENT GOALS : Improve strength; return to normal    OBJECTIVE:   DIAGNOSTIC FINDINGS:  MRI  IMPRESSION: 1. Mild tendinosis of the left gluteus minimus tendon. Small partial-thickness tear of the left gluteus medius tendon  insertion. 2. Limited evaluation of the labrum secondary lack of intra-articular fluid. Degeneration of the left labrum. 3. 4 cm right ovarian simple-appearing cyst, not adequately characterized. Recommend prompt follow-up with pelvic US. Reference: JACR 2020 Feb;17(2):248-254    PATIENT SURVEYS:  FOTO 1 48 @ DC 19 MCII   TODAY'S TREATMENT:  FOTO Address goals: stair climbing/length of time ambulating tolerance Pt seen for aquatic therapy today.  Treatment took place in water 3.25-4.5 ft in depth at the [facility]. Temp of water was 91.  Pt entered/exited the pool via stairs independently with bilat rail.  In 4 ft of water:  Unsupported - walking forward, backwards. Long leg kicking  Gastroc stretch; hip ER and piriformis stretch Quad and hip flex stretch at bench Holding wall:  squats 2 x10, hip openers, hurdles SLS left supported by yellow hand buoys - right le oscillating in frontal and sagittal plane for left glut firing movement  UE supported on wall: squats; curtsy squats  Pt requires the buoyancy and hydrostatic pressure of water for support, and to offload joints by unweighting joint load by at least 50 % in navel deep water and by at least 75-80% in chest to neck deep water.  Viscosity of the water is needed for resistance of strengthening. Water current perturbations provides challenge to standing balance requiring increased core activation.    11/8 Recumbent bike L1 seat 4 5 min  Exercises  STM to L adductor group and VMO, rec fem  -seated RTB hip ABD 3s  holds (start with small ROM) 10x  - Supine Piriformis Stretch with Towel  - 1 x daily - 7 x weekly - 1 sets - 10 reps - 5 hold -sidestepping at counter no band 48f x3 laps - Supine Bridge  - 1 x daily - 4-5 x weekly - 2 sets - 10 reps - Sit to Stand with RTB at knees  - 1 x daily - 4-5 x weekly - 3 sets - 5 reps   PATIENT EDUCATION:  Education details: aCareers adviser  modifications  Person educated: Patient Education method: Explanation, Demonstration, Tactile cues, Verbal cues Education comprehension: verbalized understanding, returned demonstration, verbal cues required, and tactile cues required   HOME EXERCISE PROGRAM:  Access Code: [email or web address]: [email or web address] Date: 06/28/2022 Prepared by: ADaleen Bo 10/05/22 Pt instructed on self trigger point pressure .  Added hip flex stretching  ASSESSMENT:  CLINICAL IMPRESSION: Pt is 74 days post surgery. Less pain groin with completion of added direct pressure as last visit.  Sleeping better. Progressed pt as per protocol. She tolerates fair, is limited some by left hip flex discomfort.  Able to effectively stretch with reduction in pain in area. Goals ongoing   OBJECTIVE IMPAIRMENTS Abnormal gait, decreased activity tolerance, decreased balance, decreased endurance, decreased mobility, difficulty walking, decreased ROM, decreased strength, hypomobility, increased fascial restrictions,  increased muscle spasms, impaired flexibility, improper body mechanics, postural dysfunction, and pain.   ACTIVITY LIMITATIONS carrying, lifting, bending, sitting, standing, squatting, stairs, transfers, and locomotion level  PARTICIPATION LIMITATIONS: cleaning, laundry, interpersonal relationship, driving, shopping, community activity, occupation, and exercise  PERSONAL FACTORS Age, Behavior pattern, Fitness, Past/current experiences, Time since onset of injury/illness/exacerbation, and 1-2 comorbidities:    are also affecting patient's functional outcome.   REHAB POTENTIAL: Fair    CLINICAL DECISION MAKING: Stable/uncomplicated  EVALUATION COMPLEXITY: Low   GOALS:   SHORT TERM GOALS: Target date: 09/14/22   Pt will become independent with HEP in order to demonstrate synthesis of PT education.   Goal status: Met 10/11/22  2.  Pt will report at least 2 pt reduction on NPRS scale for pain  in order to demonstrate functional improvement with household activity, self care, and ADL.   Goal status: Achieved - 09/18/22  3.  Pt will be able to demonstrate ability to descend stairs with reciprocal pattern and single UE in order to demonstrate functional improvement in LE function for self-care and house hold duties.   Goal status: Ongoing; trial into water with bilat rail with hesitation 09/18/22   4.  Pt will score at least 19 pt increase on FOTO to demonstrate functional improvement in MCII and pt perceived function.   Baseline:  Goal status: Ongoing    LONG TERM GOALS: 10/26/22 Pt  will become independent with final HEP in order to demonstrate synthesis of PT education. Goal status: INITIAL  2.  Pt will score >/= 48 on FOTO to demonstrate improvement in perceived L hip function. .   Goal status: INITIAL  3.  Pt will be able to demonstrate  full depth DL squat in order to demonstrate functional improvement in bilat LE strength for return to PLOF and exercise.   Goal status: INITIAL  4.  Pt will be able to demonstrate/report ability to walk >30 mins without pain or need for assistance in order to demonstrate functional improvement and tolerance to exercise and community mobility.  Goal status: INITIAL   PLAN: PT FREQUENCY: 1-2x/week  PT DURATION: 12 wks  PLANNED INTERVENTIONS: Therapeutic exercises, Therapeutic activity, Neuromuscular re-education, Balance training, Gait training, Patient/Family education, Self Care, Joint mobilization, Joint manipulation, Stair training, Orthotic/Fit training, DME instructions, Aquatic Therapy, Dry Needling, Electrical stimulation, Spinal manipulation, Spinal mobilization, Cryotherapy, Moist heat, scar mobilization, Splintting, Taping, Vasopneumatic device, Traction, Ultrasound, Ionotophoresis 67m/ml Dexamethasone, Manual therapy, and Re-evaluation  PLAN FOR NEXT SESSION: aquatic intro, progress per glute med repair protocol, no ROM  restrictions  - FOTO.   MAnnamarie Major Faithlyn Recktenwald MPT   [facility] [address]Phone: [phone]  Fax:  [phone]

## 2022-10-15 ENCOUNTER — Ambulatory Visit (HOSPITAL_BASED_OUTPATIENT_CLINIC_OR_DEPARTMENT_OTHER): Payer: BC Managed Care – PPO | Admitting: Physical Therapy

## 2022-10-15 ENCOUNTER — Ambulatory Visit (INDEPENDENT_AMBULATORY_CARE_PROVIDER_SITE_OTHER): Payer: BC Managed Care – PPO | Admitting: Orthopaedic Surgery

## 2022-10-15 ENCOUNTER — Encounter (HOSPITAL_BASED_OUTPATIENT_CLINIC_OR_DEPARTMENT_OTHER): Payer: Self-pay | Admitting: Physical Therapy

## 2022-10-15 DIAGNOSIS — R600 Localized edema: Secondary | ICD-10-CM

## 2022-10-15 DIAGNOSIS — M6281 Muscle weakness (generalized): Secondary | ICD-10-CM

## 2022-10-15 DIAGNOSIS — S76012A Strain of muscle, fascia and tendon of left hip, initial encounter: Secondary | ICD-10-CM

## 2022-10-15 DIAGNOSIS — R262 Difficulty in walking, not elsewhere classified: Secondary | ICD-10-CM

## 2022-10-15 DIAGNOSIS — M25552 Pain in left hip: Secondary | ICD-10-CM | POA: Diagnosis not present

## 2022-10-15 NOTE — Progress Notes (Signed)
Post Operative Evaluation    Procedure/Date of Surgery: Left hip gluteus medius repair 07/30/22  Interval History:   Presents today for follow-up of her left hip.  Overall she is doing well.  She does state that she is still having some swelling and tenderness about the lateral aspect of the hip which she is working on with a scar roller.  She is walking better and continues to improve.  PMH/PSH/Family History/Social History/Meds/Allergies:    Past Medical History:  Diagnosis Date   Diverticula of colon    Ganglion cyst    Gastritis    History of colonic polyps    Hypertension    on 03/08/15 pt denies high bp in over 10 yrs    IBS (irritable bowel syndrome)    Melanoma ([place]) 10/29/2004   seasonal allergies    Past Surgical History:  Procedure Laterality Date   ABDOMINAL HYSTERECTOMY  10/29/1997   CARPAL TUNNEL RELEASE Right    COLONOSCOPY  03/08/2015   FOOT SURGERY Bilateral    surg x 3 right, surg x 1 left   GLUTEUS MINIMUS REPAIR Left 07/30/2022   Procedure: LEFT GLUTEUS MEDIUS REPAIR;  Surgeon: Vanetta Mulders, MD;  Location: [facility];  Service: Orthopedics;  Laterality: Left;   KNEE ARTHROSCOPY Right 10/30/2011   MELANOMA EXCISION  10/29/2004   left shoulder   SPINAL FUSION     UPPER GASTROINTESTINAL ENDOSCOPY  03/08/2015   Dr.Pyrtle   WISDOM TOOTH EXTRACTION     Social History   Socioeconomic History   Marital status: Married    Spouse name: Not on file   Number of children: 2   Years of education: Not on file   Highest education level: Not on file  Occupational History   Occupation: Freight forwarder: [employer]  Tobacco Use   Smoking status: Every Day    Packs/day: 1.00    Years: 34.00    Total pack years: 34.00    Types: Cigarettes   Smokeless tobacco: Never   Tobacco comments:    Quit at age 85yr Vaping Use   Vaping Use: Never used  Substance and Sexual Activity   Alcohol use: Not Currently    Comment:  once a month per pt   Drug use: No   Sexual activity: Yes    Birth control/protection: Surgical    Comment: Hysterectomy  Other Topics Concern   Not on file  Social History Narrative   Not on file   Social Determinants of Health   Financial Resource Strain: Not on file  Food Insecurity: Not on file  Transportation Needs: Not on file  Physical Activity: Not on file  Stress: Not on file  Social Connections: Not on file   Family History  Problem Relation Age of Onset   Colon polyps Mother    Hypertension Mother    Leukemia Maternal Grandmother    Lymphoma Maternal Grandmother    Colon cancer Neg Hx    Esophageal cancer Neg Hx    Rectal cancer Neg Hx    Stomach cancer Neg Hx    Allergies  Allergen Reactions   Cetirizine Hcl Other (See Comments)    Not effective   Hydrocodone Itching    Have hypersensitivity to most pain meds   Other    Cephalexin  Rash   Penicillins Rash   Sulfa Antibiotics Rash   Current Outpatient Medications  Medication Sig Dispense Refill   Ascorbic Acid (VITAMIN C PO) Take 500 mg by mouth daily.     aspirin EC 325 MG tablet Take 1 tablet (325 mg total) by mouth daily. 30 tablet 0   Cholecalciferol (VITAMIN D-3) 125 MCG (5000 UT) TABS Take by mouth.     dicyclomine (BENTYL) 20 MG tablet TAKE (1) TABLET BY MOUTH (3) TIMES DAILY. (Patient taking differently: Take 20 mg by mouth daily as needed for spasms.) 90 tablet 3   ELDERBERRY PO Take 50 mg by mouth daily. Gummy     fluticasone (FLONASE) 50 MCG/ACT nasal spray Place 1 spray into both nostrils daily as needed for allergies.     hydrochlorothiazide (MICROZIDE) 12.5 MG capsule Take 12.5 mg by mouth daily.  1   HYDROmorphone (DILAUDID) 2 MG tablet Take 1 tablet (2 mg total) by mouth every 4 (four) hours as needed for severe pain. 30 tablet 0   levocetirizine (XYZAL) 5 MG tablet Take 5 mg by mouth every evening.     losartan (COZAAR) 50 MG tablet Take 50 mg by mouth daily.     saccharomyces boulardii  (FLORASTOR) 250 MG capsule Take 500 mg by mouth daily.     vitamin E 180 MG (400 UNITS) capsule Take 400 Units by mouth daily.     No current facility-administered medications for this visit.   No results found.  Review of Systems:   A ROS was performed including pertinent positives and negatives as documented in the HPI.   Musculoskeletal Exam:    There were no vitals taken for this visit.  Left hip is well-healed.  Sensation is intact in all distributions.  Active range of motion of the left hip is 120 degrees of flexion with 20 degrees internal and external rotation.  Improved strength with abduction of the hip nearly equal to contralateral side.  Distal neurosensory exam is intact with 2+ dorsalis pedis pulse  Imaging:    None  I personally reviewed and interpreted the radiographs.   Assessment:   10 weeks status post left hip gluteus medius repair.  Overall she is continuing to improve.  I have specifically advised her on a percussion type Theragran device given her scar sensitivity and swelling about the lateral hip and quadriceps.  Would like her to work on this.  I will plan to see her back in 3 months for reassessment.  All limitations and restrictions were discussed  Plan :    -Return to clinic in 3 months for reassessment     I personally saw and evaluated the patient, and participated in the management and treatment plan.  Vanetta Mulders, MD Attending Physician, Orthopedic Surgery  This document was dictated using Dragon voice recognition software. A reasonable attempt at proof reading has been made to minimize errors.

## 2022-10-17 ENCOUNTER — Ambulatory Visit (HOSPITAL_BASED_OUTPATIENT_CLINIC_OR_DEPARTMENT_OTHER): Payer: BC Managed Care – PPO | Admitting: Physical Therapy

## 2022-11-01 ENCOUNTER — Encounter (HOSPITAL_BASED_OUTPATIENT_CLINIC_OR_DEPARTMENT_OTHER): Payer: Self-pay | Admitting: Physical Therapy

## 2022-11-01 ENCOUNTER — Ambulatory Visit (HOSPITAL_BASED_OUTPATIENT_CLINIC_OR_DEPARTMENT_OTHER): Payer: No Typology Code available for payment source | Attending: Orthopaedic Surgery | Admitting: Physical Therapy

## 2022-11-01 DIAGNOSIS — M25552 Pain in left hip: Secondary | ICD-10-CM

## 2022-11-01 DIAGNOSIS — R262 Difficulty in walking, not elsewhere classified: Secondary | ICD-10-CM | POA: Diagnosis present

## 2022-11-01 DIAGNOSIS — M6281 Muscle weakness (generalized): Secondary | ICD-10-CM

## 2022-11-01 NOTE — Therapy (Signed)
OUTPATIENT PHYSICAL THERAPY LOWER EXTREMITY   Patient Name: Rhonda Porter MRN: [id number] DOB:1970/11/26, 52 y.o., female Today's Date: 11/01/2022   PT End of Session - 11/01/22 0916     Visit Number 12    Number of Visits 21    Date for PT Re-Evaluation 11/01/22    Authorization Type BCBS    PT Start Time 0845    PT Stop Time 0930    PT Time Calculation (min) 45 min    Activity Tolerance Patient tolerated treatment well;Patient limited by pain    Behavior During Therapy [facility] for tasks assessed/performed                     Past Medical History:  Diagnosis Date   Diverticula of colon    Ganglion cyst    Gastritis    History of colonic polyps    Hypertension    on 03/08/15 pt denies high bp in over 10 yrs    IBS (irritable bowel syndrome)    Melanoma ([place]) 10/29/2004   seasonal allergies    Past Surgical History:  Procedure Laterality Date   ABDOMINAL HYSTERECTOMY  10/29/1997   CARPAL TUNNEL RELEASE Right    COLONOSCOPY  03/08/2015   FOOT SURGERY Bilateral    surg x 3 right, surg x 1 left   GLUTEUS MINIMUS REPAIR Left 07/30/2022   Procedure: LEFT GLUTEUS MEDIUS REPAIR;  Surgeon: Vanetta Mulders, MD;  Location: [facility];  Service: Orthopedics;  Laterality: Left;   KNEE ARTHROSCOPY Right 10/30/2011   MELANOMA EXCISION  10/29/2004   left shoulder   SPINAL FUSION     UPPER GASTROINTESTINAL ENDOSCOPY  03/08/2015   Dr.Pyrtle   WISDOM TOOTH EXTRACTION     Patient Active Problem List   Diagnosis Date Noted   Tear of left gluteus medius tendon    Lower abdominal pain 04/11/2017   Menopausal symptom 04/11/2017   Pain in pelvis 04/11/2017   LUQ abdominal pain 02/10/2015   Bloating 02/08/2015   Irritable bowel syndrome (IBS) 02/08/2015   Abdominal cramps 02/08/2015   DIVERTICULAR BLEEDING, HX OF 01/18/2009   HYPERTENSION 01/17/2009   COLONIC POLYPS, HYPERPLASTIC, HX OF 01/17/2009    PCP: Lanelle Bal, PA-C  REFERRING PROVIDER: Vanetta Mulders,  MD  REFERRING DIAG: [phone] (ICD-10-CM) - Tear of left gluteus medius tendon, initial encounter  THERAPY DIAG:  Pain in left hip  Muscle weakness (generalized)  Difficulty walking  Rationale for Evaluation and Treatment Rehabilitation  ONSET DATE: Days since surgery: 94   07/30/2022  1.  Left hip gluteus medius minimus repair 2.  Left hip open trochanteric bursectomy  SUBJECTIVE:   SUBJECTIVE STATEMENT: Pt states that the hip feels better and she is doing well. The only trouble she has is sitting at a computer chair. Pt has a massager that she has been using. She is able to sleep on her L side now.    Eval:  Pt presents today follow surgical repair. Pt states the pain is bad today. She had a better day yesterday. The first two days were very painful. She has been sleeping in the recliner. She has been using the RW at home but has crutches as well. Pt presents with husband today for assistance. Pt has been compliant with aspirin usage and has been compliant with TTWB precautions. Pt denies signs of infection.    PERTINENT HISTORY: R fibularis tendon rupture, 2012 melanoma; history of surgeries for R ankle fibularis tendons  PAIN:  Are you having  pain? yes: NPRS scale: 1/10 Pain location: Lt glute, superior to ischial tuberosity Pain description: dull, sore Aggravating factors: moving Relieving factors: resting PRECAUTIONS: TTWB for 2 wks  WEIGHT BEARING RESTRICTIONS No  FALLS:  Has patient fallen in last 6 months? No  LIVING ENVIRONMENT: Lives with: lives with their family and lives with their spouse Lives in: House/apartment Stairs: No, only stairs on back deck Has following equipment at home: None  OCCUPATION: [place], sitting 8 hours  PLOF: Independent with basic ADLs  PATIENT GOALS : Improve strength; return to normal    OBJECTIVE:   DIAGNOSTIC FINDINGS:  MRI  IMPRESSION: 1. Mild tendinosis of the left gluteus minimus tendon. Small partial-thickness  tear of the left gluteus medius tendon insertion. 2. Limited evaluation of the labrum secondary lack of intra-articular fluid. Degeneration of the left labrum. 3. 4 cm right ovarian simple-appearing cyst, not adequately characterized. Recommend prompt follow-up with pelvic US. Reference: JACR 2020 Feb;17(2):248-254    PATIENT SURVEYS:  FOTO 1 48 @ DC 19 MCII 10/15/22: 62%  LOWER EXTREMITY ROM:  AROM Left 1/4  Hip flexion 100  Hip extension 5  Hip abduction 30  Hip adduction    Hip internal rotation 20  Hip external rotation 35   (Blank rows = not tested)   LOWER EXTREMITY MMT: 4-/5 throughout L hip   TODAY'S TREATMENT:  1/2 Access Code: [email or web address] URL: [email or web address] Date: 11/01/2022 Prepared by: Daleen Bo  Exercises - Seated Piriformis Stretch  - 2 x daily - 7 x weekly - 1 sets - 3 reps - 30 hold - Standing Marching  - 1 x daily - 7 x weekly - 2 sets - 20 reps - Sit to Stand with Resistance Around Legs  - 1 x daily - 2-3 x weekly - 3 sets - 5 reps - Tandem Stance  - 1 x daily - 2-3 x weekly - 1 sets - 3 reps - 30 hold - Side Stepping with Resistance at Thighs  - 1 x daily - 2-3 x weekly - 1 sets - 3 reps - 16f hold - Full Leg Press  - 1 x daily - 2-3 x weekly - 3 sets - 8 reps - Hamstring Curl with Weight Machine  - 1 x daily - 2-3 x weekly - 3 sets - 8 reps - Knee Extension with Weight Machine  - 1 x daily - 2-3 x weekly - 3 sets - 8 reps  Previous:  FOTO completed scored 62%  Pt seen for aquatic therapy today.  Treatment took place in water 3.25-4.5 ft in depth at the [facility] Temp of water was 91.  Pt entered/exited the pool via stairs independently with bilat rail.  In 4 ft of water:  Unsupported - walking forward, backwards. Long leg kicking  Gastroc stretch; hip ER and piriformis stretch Quad and hip flex stretch at bench Step ups bottom step leading R/L x10; TKE lle bottom step x10; side step ups x10. Ue support of  2 fingers for balancw Hamstring, gastroc, ad and abd stretch using sm blue squoodle Noodle press lle hip in neutral then in ER 3 sets of 15s intervals ea position Unsupported: 3 ft squats  x10, bottom set x10, 2nd step up x10 UE supported on wall:curtsy squats  Pt requires the buoyancy and hydrostatic pressure of water for support, and to offload joints by unweighting joint load by at least 50 % in navel deep water and by at least 75-80% in chest to neck deep  water.  Viscosity of the water is needed for resistance of strengthening. Water current perturbations provides challenge to standing balance requiring increased core activation.    11/8 Recumbent bike L1 seat 4 5 min  Exercises  STM to L adductor group and VMO, rec fem  -seated RTB hip ABD 3s  holds (start with small ROM) 10x  - Supine Piriformis Stretch with Towel  - 1 x daily - 7 x weekly - 1 sets - 10 reps - 5 hold -sidestepping at counter no band 57f x3 laps - Supine Bridge  - 1 x daily - 4-5 x weekly - 2 sets - 10 reps - Sit to Stand with RTB at knees  - 1 x daily - 4-5 x weekly - 3 sets - 5 reps   PATIENT EDUCATION:  Education details: D/C plan, exercise progression, DOMS expectations, self progression, safety in the gym, slow graded progression, HEP, POC   Person educated: Patient Education method: Explanation, Demonstration, Tactile cues, Verbal cues Education comprehension: verbalized understanding, returned demonstration, verbal cues required, and tactile cues required   HOME EXERCISE PROGRAM:  Access Code: [email or web address]: [email or web address] Date: 06/28/2022 Prepared by: ADaleen Bo 10/05/22 Pt instructed on self trigger point pressure .  Added hip flex stretching  ASSESSMENT:  CLINICAL IMPRESSION: Patient has improved significantly with objective and subjective measures as demonstrated by Foto and hip strength and range of motion.  Although patient does continue to lack strength and range of  motion patient does appear to have good understanding of self progression and gave verbal understanding to education provided today about continuing exercise for left hip rehab.  Plan to discharge today in order to address patient concerns with insurance/finances as documented in previous note.  Plan to discharge this episode of care at this time.  All patient concerns and questions addressed.     OBJECTIVE IMPAIRMENTS Abnormal gait, decreased activity tolerance, decreased balance, decreased endurance, decreased mobility, difficulty walking, decreased ROM, decreased strength, hypomobility, increased fascial restrictions, increased muscle spasms, impaired flexibility, improper body mechanics, postural dysfunction, and pain.   ACTIVITY LIMITATIONS carrying, lifting, bending, sitting, standing, squatting, stairs, transfers, and locomotion level  PARTICIPATION LIMITATIONS: cleaning, laundry, interpersonal relationship, driving, shopping, community activity, occupation, and exercise  PERSONAL FACTORS Age, Behavior pattern, Fitness, Past/current experiences, Time since onset of injury/illness/exacerbation, and 1-2 comorbidities:    are also affecting patient's functional outcome.   REHAB POTENTIAL: Fair    CLINICAL DECISION MAKING: Stable/uncomplicated  EVALUATION COMPLEXITY: Low   GOALS:   SHORT TERM GOALS: Target date: 09/14/22   Pt will become independent with HEP in order to demonstrate synthesis of PT education.   Goal status: Met 10/11/22  2.  Pt will report at least 2 pt reduction on NPRS scale for pain in order to demonstrate functional improvement with household activity, self care, and ADL.   Goal status: Achieved - 09/18/22  3.  Pt will be able to demonstrate ability to descend stairs with reciprocal pattern and single UE in order to demonstrate functional improvement in LE function for self-care and house hold duties.   Goal status: MET; trial into water with bilat rail with  hesitation 09/18/22   4.  Pt will score at least 19 pt increase on FOTO to demonstrate functional improvement in MCII and pt perceived function.   Baseline:  Goal status: Achieved    LONG TERM GOALS: 10/26/22 Pt  will become independent with final HEP in order to demonstrate synthesis of PT education. Goal status: met  2.  Pt will score >/= 48 on FOTO to demonstrate improvement in perceived L hip function. .   Goal status: Surpasses Achieved 10/15/22  3.  Pt will be able to demonstrate  full depth DL squat in order to demonstrate functional improvement in bilat LE strength for return to PLOF and exercise.  10/15/22: able to complete 25% submerged Goal status: partially met  4.  Pt will be able to demonstrate/report ability to walk >30 mins without pain or need for assistance in order to demonstrate functional improvement and tolerance to exercise and community mobility.  Goal status: met  PLAN: PT FREQUENCY: 1-2x/week  PT DURATION: 12 wks  PLANNED INTERVENTIONS: Therapeutic exercises, Therapeutic activity, Neuromuscular re-education, Balance training, Gait training, Patient/Family education, Self Care, Joint mobilization, Joint manipulation, Stair training, Orthotic/Fit training, DME instructions, Aquatic Therapy, Dry Needling, Electrical stimulation, Spinal manipulation, Spinal mobilization, Cryotherapy, Moist heat, scar mobilization, Splintting, Taping, Vasopneumatic device, Traction, Ultrasound, Ionotophoresis 27m/ml Dexamethasone, Manual therapy, and Re-evaluation  ADaleen BoPT, DPT 11/01/22 9:33 AM

## 2022-12-12 ENCOUNTER — Ambulatory Visit (HOSPITAL_BASED_OUTPATIENT_CLINIC_OR_DEPARTMENT_OTHER): Payer: No Typology Code available for payment source | Admitting: Orthopaedic Surgery

## 2022-12-12 DIAGNOSIS — M25552 Pain in left hip: Secondary | ICD-10-CM | POA: Diagnosis not present

## 2022-12-12 MED ORDER — LIDOCAINE HCL 1 % IJ SOLN
4.0000 mL | INTRAMUSCULAR | Status: AC | PRN
  Administered 2022-12-12: 4 mL

## 2022-12-12 MED ORDER — TRIAMCINOLONE ACETONIDE 40 MG/ML IJ SUSP
80.0000 mg | INTRAMUSCULAR | Status: AC | PRN
  Administered 2022-12-12: 80 mg via INTRA_ARTICULAR

## 2022-12-12 NOTE — Progress Notes (Addendum)
Post Operative Evaluation    Procedure/Date of Surgery: Left hip gluteus medius repair 07/30/22  Interval History:   Presents today for follow-up of her left hip.  Overall her lateral based gluteal type pain is improved although she still is having persistent pain in several areas including the ischial tuberosity and the medial groin.  This is causing irritation when she walks.  PMH/PSH/Family History/Social History/Meds/Allergies:    Past Medical History:  Diagnosis Date  . Diverticula of colon   . Ganglion cyst   . Gastritis   . History of colonic polyps   . Hypertension    on 03/08/15 pt denies high bp in over 10 yrs   . IBS (irritable bowel syndrome)   . Melanoma ([place]) 10/29/2004  . seasonal allergies    Past Surgical History:  Procedure Laterality Date  . ABDOMINAL HYSTERECTOMY  10/29/1997  . CARPAL TUNNEL RELEASE Right   . COLONOSCOPY  03/08/2015  . FOOT SURGERY Bilateral    surg x 3 right, surg x 1 left  . GLUTEUS MINIMUS REPAIR Left 07/30/2022   Procedure: LEFT GLUTEUS MEDIUS REPAIR;  Surgeon: Vanetta Mulders, MD;  Location: [facility];  Service: Orthopedics;  Laterality: Left;  . KNEE ARTHROSCOPY Right 10/30/2011  . MELANOMA EXCISION  10/29/2004   left shoulder  . SPINAL FUSION    . UPPER GASTROINTESTINAL ENDOSCOPY  03/08/2015   Dr.Pyrtle  . WISDOM TOOTH EXTRACTION     Social History   Socioeconomic History  . Marital status: Married    Spouse name: Not on file  . Number of children: 2  . Years of education: Not on file  . Highest education level: Not on file  Occupational History  . Occupation: Freight forwarder: [employer]  Tobacco Use  . Smoking status: Every Day    Packs/day: 1.00    Years: 34.00    Total pack years: 34.00    Types: Cigarettes  . Smokeless tobacco: Never  . Tobacco comments:    Quit at age 64yr Vaping Use  . Vaping Use: Never used  Substance and Sexual Activity  . Alcohol use: Not  Currently    Comment: once a month per pt  . Drug use: No  . Sexual activity: Yes    Birth control/protection: Surgical    Comment: Hysterectomy  Other Topics Concern  . Not on file  Social History Narrative  . Not on file   Social Determinants of Health   Financial Resource Strain: Not on file  Food Insecurity: Not on file  Transportation Needs: Not on file  Physical Activity: Not on file  Stress: Not on file  Social Connections: Not on file   Family History  Problem Relation Age of Onset  . Colon polyps Mother   . Hypertension Mother   . Leukemia Maternal Grandmother   . Lymphoma Maternal Grandmother   . Colon cancer Neg Hx   . Esophageal cancer Neg Hx   . Rectal cancer Neg Hx   . Stomach cancer Neg Hx    Allergies  Allergen Reactions  . Cetirizine Hcl Other (See Comments)    Not effective  . Hydrocodone Itching    Have hypersensitivity to most pain meds  . Other   . Cephalexin Rash  . Penicillins Rash  . Sulfa  Antibiotics Rash   Current Outpatient Medications  Medication Sig Dispense Refill  . Ascorbic Acid (VITAMIN C PO) Take 500 mg by mouth daily.    Marland Kitchen aspirin EC 325 MG tablet Take 1 tablet (325 mg total) by mouth daily. 30 tablet 0  . Cholecalciferol (VITAMIN D-3) 125 MCG (5000 UT) TABS Take by mouth.    . dicyclomine (BENTYL) 20 MG tablet TAKE (1) TABLET BY MOUTH (3) TIMES DAILY. (Patient taking differently: Take 20 mg by mouth daily as needed for spasms.) 90 tablet 3  . ELDERBERRY PO Take 50 mg by mouth daily. Gummy    . fluticasone (FLONASE) 50 MCG/ACT nasal spray Place 1 spray into both nostrils daily as needed for allergies.    . hydrochlorothiazide (MICROZIDE) 12.5 MG capsule Take 12.5 mg by mouth daily.  1  . HYDROmorphone (DILAUDID) 2 MG tablet Take 1 tablet (2 mg total) by mouth every 4 (four) hours as needed for severe pain. 30 tablet 0  . levocetirizine (XYZAL) 5 MG tablet Take 5 mg by mouth every evening.    Marland Kitchen losartan (COZAAR) 50 MG tablet Take  50 mg by mouth daily.    Marland Kitchen saccharomyces boulardii (FLORASTOR) 250 MG capsule Take 500 mg by mouth daily.    . vitamin E 180 MG (400 UNITS) capsule Take 400 Units by mouth daily.     No current facility-administered medications for this visit.   No results found.  Review of Systems:   A ROS was performed including pertinent positives and negatives as documented in the HPI.   Musculoskeletal Exam:    There were no vitals taken for this visit.  Left hip is well-healed.  Sensation is intact in all distributions.  Active range of motion of the left hip is 120 degrees of flexion with 20 degrees internal and external rotation.  Improved strength with abduction of the hip nearly equal to contralateral side.  She does still have a mild Trendelenburg gait.  There is tenderness about the ischial tuberosity with deep palpation as well as the abductor tubercle.  Distal neurosensory exam is intact with 2+ dorsalis pedis pulse  Imaging:    None  I personally reviewed and interpreted the radiographs.   Assessment:   Status post left hip gluteus medius repair.  Overall she is continuing to improve.  Her lateral based hip pain is improved but she is still having persistent pain about the ischial tuberosity as well as abductor origin.  She does trace her groin area when asked to specify where her pain is and as result I did suspect that her known labral tear may be symptomatic.  That being said I have recommended diagnostic ultrasound guided left femoral acetabular injection at today's visit and given her 10 minutes to experience any symptom relief from the local anesthetic.  Following this she got little to no relief in her pinching type pain was very persistent.  To that effect I do believe that she is suffering from a proximal hamstring and abductor tendinopathy that is likely irritating her.  MRI does show some fluid around both these areas without any discrete tear.  At this time I do believe that she  would benefit from consultation with Dr. Rolena Infante for percussive treatment of the ischial tuberosity as well as abductor origins as I believe she is experiencing multiple sources of tendinopathy.  Will plan to begin with this.  I will see her back in 1 month as well to reassess clinical efficacy Plan :    -  Plan for referral to Dr. Rolena Infante for ischial as well as adductor origin shock therapy     Procedure Note  Patient: Rhonda Porter             Date of Birth: 08/31/1971           MRN: [id number]             Visit Date: 12/12/2022  Procedures: Visit Diagnoses:  1. Pain in left hip     Large Joint Inj: L hip joint on 12/12/2022 2:06 PM Indications: pain Details: 22 G 3.5 in needle, ultrasound-guided anterolateral approach  Arthrogram: No  Medications: 4 mL lidocaine 1 %; 80 mg triamcinolone acetonide 40 MG/ML Outcome: tolerated well, no immediate complications Procedure, treatment alternatives, risks and benefits explained, specific risks discussed. Consent was given by the patient. Immediately prior to procedure a time out was called to verify the correct patient, procedure, equipment, support staff and site/side marked as required. Patient was prepped and draped in the usual sterile fashion.       I personally saw and evaluated the patient, and participated in the management and treatment plan.  Vanetta Mulders, MD Attending Physician, Orthopedic Surgery  This document was dictated using Dragon voice recognition software. A reasonable attempt at proof reading has been made to minimize errors.

## 2022-12-18 ENCOUNTER — Other Ambulatory Visit: Payer: Self-pay

## 2022-12-18 ENCOUNTER — Telehealth: Payer: Self-pay | Admitting: Internal Medicine

## 2022-12-18 ENCOUNTER — Other Ambulatory Visit (INDEPENDENT_AMBULATORY_CARE_PROVIDER_SITE_OTHER): Payer: No Typology Code available for payment source

## 2022-12-18 DIAGNOSIS — K921 Melena: Secondary | ICD-10-CM

## 2022-12-18 LAB — CBC WITH DIFFERENTIAL/PLATELET
Basophils Absolute: 0.2 10*3/uL — ABNORMAL HIGH (ref 0.0–0.1)
Basophils Relative: 1.2 % (ref 0.0–3.0)
Eosinophils Absolute: 0.2 10*3/uL (ref 0.0–0.7)
Eosinophils Relative: 0.9 % (ref 0.0–5.0)
HCT: 46.5 % — ABNORMAL HIGH (ref 36.0–46.0)
Hemoglobin: 15.4 g/dL — ABNORMAL HIGH (ref 12.0–15.0)
Lymphocytes Relative: 19.6 % (ref 12.0–46.0)
Lymphs Abs: 3.5 10*3/uL (ref 0.7–4.0)
MCHC: 33 g/dL (ref 30.0–36.0)
MCV: 92.5 fl (ref 78.0–100.0)
Monocytes Absolute: 1.2 10*3/uL — ABNORMAL HIGH (ref 0.1–1.0)
Monocytes Relative: 6.7 % (ref 3.0–12.0)
Neutro Abs: 12.8 10*3/uL — ABNORMAL HIGH (ref 1.4–7.7)
Neutrophils Relative %: 71.6 % (ref 43.0–77.0)
Platelets: 494 10*3/uL — ABNORMAL HIGH (ref 150.0–400.0)
RBC: 5.03 Mil/uL (ref 3.87–5.11)
RDW: 14.1 % (ref 11.5–15.5)
WBC: 17.9 10*3/uL — ABNORMAL HIGH (ref 4.0–10.5)

## 2022-12-18 NOTE — Telephone Encounter (Signed)
This could be a diverticular bleed and very unlikely to be a tear in the colon. We need to have her come for CBC If she has numerous bloody bowel movements in the next 12 to 24 hours she will need to go to the ER for acute evaluation Otherwise lets see what her blood counts are and check on her tomorrow afternoon

## 2022-12-18 NOTE — Telephone Encounter (Signed)
Inbound call from patient requesting a call back from the nurse to discuss rectal bleeding she was having last night. Please advise.

## 2022-12-18 NOTE — Telephone Encounter (Signed)
Spoke with pt and she is aware of results and recommendations per Dr. Hilarie Fredrickson. Order in for labs, she will come today to the lab.

## 2022-12-18 NOTE — Telephone Encounter (Signed)
Pt states last night she had a BM and there was BRB in the toilet. This am she had another BM and she reports there was a lot of blood, not a lot of stool and there were clots present. She is concerned because she had a tear in her colon in the past and it reminds her of when that happened. There are no PA appts until 3/1. Please advise.

## 2022-12-19 ENCOUNTER — Ambulatory Visit (HOSPITAL_BASED_OUTPATIENT_CLINIC_OR_DEPARTMENT_OTHER): Payer: No Typology Code available for payment source | Admitting: Orthopaedic Surgery

## 2022-12-20 ENCOUNTER — Ambulatory Visit: Payer: No Typology Code available for payment source | Admitting: Sports Medicine

## 2022-12-20 ENCOUNTER — Other Ambulatory Visit: Payer: Self-pay

## 2022-12-20 DIAGNOSIS — K921 Melena: Secondary | ICD-10-CM

## 2022-12-20 DIAGNOSIS — D72829 Elevated white blood cell count, unspecified: Secondary | ICD-10-CM

## 2022-12-20 DIAGNOSIS — K625 Hemorrhage of anus and rectum: Secondary | ICD-10-CM

## 2022-12-24 ENCOUNTER — Ambulatory Visit: Payer: No Typology Code available for payment source | Admitting: Sports Medicine

## 2022-12-28 ENCOUNTER — Ambulatory Visit
Admission: RE | Admit: 2022-12-28 | Discharge: 2022-12-28 | Disposition: A | Discharge status: Home / Self Care | Payer: No Typology Code available for payment source | Source: Ambulatory Visit | Attending: Internal Medicine | Admitting: Internal Medicine

## 2022-12-28 DIAGNOSIS — K625 Hemorrhage of anus and rectum: Secondary | ICD-10-CM

## 2022-12-28 DIAGNOSIS — K921 Melena: Secondary | ICD-10-CM | POA: Diagnosis present

## 2022-12-28 DIAGNOSIS — D72829 Elevated white blood cell count, unspecified: Secondary | ICD-10-CM | POA: Insufficient documentation

## 2022-12-28 MED ORDER — IOHEXOL 300 MG/ML  SOLN
100.0000 mL | Freq: Once | INTRAMUSCULAR | Status: AC | PRN
  Administered 2022-12-28: 100 mL via ORAL

## 2023-01-02 MED ORDER — IOHEXOL 300 MG/ML  SOLN
100.0000 mL | Freq: Once | INTRAMUSCULAR | Status: AC | PRN
  Administered 2023-01-02: 100 mL via INTRAVENOUS

## 2023-01-03 ENCOUNTER — Ambulatory Visit: Payer: No Typology Code available for payment source | Admitting: Sports Medicine

## 2023-01-03 ENCOUNTER — Encounter: Payer: Self-pay | Admitting: Radiology

## 2023-01-04 ENCOUNTER — Ambulatory Visit: Payer: No Typology Code available for payment source | Admitting: Sports Medicine

## 2023-01-04 ENCOUNTER — Encounter: Payer: Self-pay | Admitting: Sports Medicine

## 2023-01-04 DIAGNOSIS — M25552 Pain in left hip: Secondary | ICD-10-CM | POA: Diagnosis not present

## 2023-01-04 DIAGNOSIS — S76012S Strain of muscle, fascia and tendon of left hip, sequela: Secondary | ICD-10-CM

## 2023-01-04 DIAGNOSIS — M76899 Other specified enthesopathies of unspecified lower limb, excluding foot: Secondary | ICD-10-CM

## 2023-01-04 NOTE — Progress Notes (Signed)
Rhonda Porter - 52 y.o. female MRN [id number]  Date of birth: [birth year]  Office Visit Note: Visit Date: 01/04/2023 PCP: Lanelle Bal, PA-C Referred by: Vanetta Mulders, MD  Subjective: No chief complaint on file.  HPI: Rhonda Porter is a pleasant 52 y.o. female who presents today for left hip and gluteal pain.  She is status post left hip gluteus medius repair on 07/30/2022 by my partner, Dr. Sammuel Hines.  She has had some improvement in strength with this, although still having some residual pain and swelling over the lateral hip.  Also has pain near the ischial tuberosity, was initially thought she had some proximal hamstring origin tendinopathy.  She had done formalized physical therapy but was discharged from this a little over a month ago.  She is not taking any medications consistently for this.  She also has had some pain near the inner groin. She did undergo 2 ultrasound-guided intra-articular hip injections without relief of her pain.  Pertinent ROS were reviewed with the patient and found to be negative unless otherwise specified above in HPI.   Assessment & Plan: Visit Diagnoses:  1. Tear of left gluteus medius tendon, sequela   2. Pain in left hip   3. Hamstring tendonitis at origin    Plan: Discussed with Rhonda Porter the nature of her lateral hip pain.  She does have some soft tissue swelling over the lateral hip.  Per my in Dr. Eddie Dibbles evaluation, I do feel like she is dealing with some tendinopathy of the hip abductors and proximal hamstring origin.  Through shared decision making, elected to proceed with trial of extracorporeal shockwave therapy.  Patient tolerated well.  I like to see how she does over the coming week and repeat treatment.  Given the swelling, we will plan for limited ultrasound evaluation at next visit before proceeding.  Will plan to address the adductors as well going forward.  After 2 sessions of ECSWT, we will reevaluate her improvement before deciding on  continued treatment.  Other options could be consideration of nitroglycerin patch protocol, oral medication therapy, ischial tuberosity injection.  Follow-up: Return in about 1 week (around 01/11/2023) for for [place] of hip and eval (30-min appt).   Meds & Orders: No orders of the defined types were placed in this encounter.  No orders of the defined types were placed in this encounter.    Procedures: Procedure: ECSWT Indications:  Lateral hip pain, proximal hamstring tendinopathy/itis   Procedure Details Consent: Risks of procedure as well as the alternatives and risks of each were explained to the patient.  Verbal consent for procedure obtained. Time Out: Verified patient identification, verified procedure, site was marked, verified correct patient position. The area was cleaned with alcohol swab.     The left lateral hip abductors and ischial tuberosity/proximal hamstring was targeted for Extracorporeal shockwave therapy.    Preset: Trochanteric bursitis Power Level: 60-90 mJ Frequency: 10 Hz Impulse/cycles: 2500 Head size: Regular   Patient tolerated procedure well without immediate complications.       Clinical History: No specialty comments available.  She reports that she has been smoking cigarettes. She has a 34.00 pack-year smoking history. She has never used smokeless tobacco. No results for input(s): "HGBA1C", "LABURIC" in the last 8760 hours.  Objective:    Physical Exam  Gen: Well-appearing, in no acute distress; non-toxic CV: Well-perfused. Warm.  Resp: Breathing unlabored on room air; no wheezing. Psych: Fluid speech in conversation; appropriate affect; normal thought process Neuro: Sensation intact  throughout. No gross coordination deficits.   Ortho Exam - Left hip: There is a well-healed incision over the lateral hip.  There is some tenderness around this area as well as palpating from both attachment sites of the gluteus medius and minimus tendon.  There is  also some mild TTP palpating on the ischial tuberosity.  The lateral hip does have some soft tissue swelling that is tender to the touch, no significant warmth or redness.  There were no blocks to internal or external rotation about the hip.  There is some very mild weakness with resisted hip abduction on that hip compared to the contralateral hip, pain with resisted hamstring curl. NVI.  Imaging: No results found.  Past Medical/Family/Surgical/Social History: Medications & Allergies reviewed per EMR, new medications updated. Patient Active Problem List   Diagnosis Date Noted   Tear of left gluteus medius tendon    Lower abdominal pain 04/11/2017   Menopausal symptom 04/11/2017   Pain in pelvis 04/11/2017   LUQ abdominal pain 02/10/2015   Bloating 02/08/2015   Irritable bowel syndrome (IBS) 02/08/2015   Abdominal cramps 02/08/2015   DIVERTICULAR BLEEDING, HX OF 01/18/2009   HYPERTENSION 01/17/2009   COLONIC POLYPS, HYPERPLASTIC, HX OF 01/17/2009   Past Medical History:  Diagnosis Date   Diverticula of colon    Ganglion cyst    Gastritis    History of colonic polyps    Hypertension    on 03/08/15 pt denies high bp in over 10 yrs    IBS (irritable bowel syndrome)    Melanoma ([place]) 10/29/2004   seasonal allergies    Family History  Problem Relation Age of Onset   Colon polyps Mother    Hypertension Mother    Leukemia Maternal Grandmother    Lymphoma Maternal Grandmother    Colon cancer Neg Hx    Esophageal cancer Neg Hx    Rectal cancer Neg Hx    Stomach cancer Neg Hx    Past Surgical History:  Procedure Laterality Date   ABDOMINAL HYSTERECTOMY  10/29/1997   CARPAL TUNNEL RELEASE Right    COLONOSCOPY  03/08/2015   FOOT SURGERY Bilateral    surg x 3 right, surg x 1 left   GLUTEUS MINIMUS REPAIR Left 07/30/2022   Procedure: LEFT GLUTEUS MEDIUS REPAIR;  Surgeon: Vanetta Mulders, MD;  Location: [facility];  Service: Orthopedics;  Laterality: Left;   KNEE ARTHROSCOPY Right  10/30/2011   MELANOMA EXCISION  10/29/2004   left shoulder   SPINAL FUSION     UPPER GASTROINTESTINAL ENDOSCOPY  03/08/2015   Dr.Pyrtle   WISDOM TOOTH EXTRACTION     Social History   Occupational History   Occupation: Freight forwarder: [employer]  Tobacco Use   Smoking status: Every Day    Packs/day: 1.00    Years: 34.00    Total pack years: 34.00    Types: Cigarettes   Smokeless tobacco: Never   Tobacco comments:    Quit at age 67yr Vaping Use   Vaping Use: Never used  Substance and Sexual Activity   Alcohol use: Not Currently    Comment: once a month per pt   Drug use: No   Sexual activity: Yes    Birth control/protection: Surgical    Comment: Hysterectomy

## 2023-01-10 ENCOUNTER — Ambulatory Visit (HOSPITAL_BASED_OUTPATIENT_CLINIC_OR_DEPARTMENT_OTHER): Payer: No Typology Code available for payment source | Admitting: Orthopaedic Surgery

## 2023-01-10 DIAGNOSIS — S76012S Strain of muscle, fascia and tendon of left hip, sequela: Secondary | ICD-10-CM

## 2023-01-10 DIAGNOSIS — R2689 Other abnormalities of gait and mobility: Secondary | ICD-10-CM | POA: Diagnosis not present

## 2023-01-10 NOTE — Progress Notes (Signed)
Post Operative Evaluation    Procedure/Date of Surgery: Left hip gluteus medius repair 07/30/22  Interval History:   Presents today for follow-up of her left hip.  She has undergone 1 shockwave therapy session with Elba Barman.  Her left groin is feeling much better after an injection with myself.  At this point she is having multiple sources of pain including the ischial tuberosity as well as swelling about the distal lateral incision  PMH/PSH/Family History/Social History/Meds/Allergies:    Past Medical History:  Diagnosis Date   Diverticula of colon    Ganglion cyst    Gastritis    History of colonic polyps    Hypertension    on 03/08/15 pt denies high bp in over 10 yrs    IBS (irritable bowel syndrome)    Melanoma ([place]) 10/29/2004   seasonal allergies    Past Surgical History:  Procedure Laterality Date   ABDOMINAL HYSTERECTOMY  10/29/1997   CARPAL TUNNEL RELEASE Right    COLONOSCOPY  03/08/2015   FOOT SURGERY Bilateral    surg x 3 right, surg x 1 left   GLUTEUS MINIMUS REPAIR Left 07/30/2022   Procedure: LEFT GLUTEUS MEDIUS REPAIR;  Surgeon: Vanetta Mulders, MD;  Location: [facility];  Service: Orthopedics;  Laterality: Left;   KNEE ARTHROSCOPY Right 10/30/2011   MELANOMA EXCISION  10/29/2004   left shoulder   SPINAL FUSION     UPPER GASTROINTESTINAL ENDOSCOPY  03/08/2015   Dr.Pyrtle   WISDOM TOOTH EXTRACTION     Social History   Socioeconomic History   Marital status: Married    Spouse name: Not on file   Number of children: 2   Years of education: Not on file   Highest education level: Not on file  Occupational History   Occupation: Freight forwarder: [employer]  Tobacco Use   Smoking status: Every Day    Packs/day: 1.00    Years: 34.00    Additional pack years: 0.00    Total pack years: 34.00    Types: Cigarettes   Smokeless tobacco: Never   Tobacco comments:    Quit at age 88yr Vaping Use   Vaping Use:  Never used  Substance and Sexual Activity   Alcohol use: Not Currently    Comment: once a month per pt   Drug use: No   Sexual activity: Yes    Birth control/protection: Surgical    Comment: Hysterectomy  Other Topics Concern   Not on file  Social History Narrative   Not on file   Social Determinants of Health   Financial Resource Strain: Not on file  Food Insecurity: Not on file  Transportation Needs: Not on file  Physical Activity: Not on file  Stress: Not on file  Social Connections: Not on file   Family History  Problem Relation Age of Onset   Colon polyps Mother    Hypertension Mother    Leukemia Maternal Grandmother    Lymphoma Maternal Grandmother    Colon cancer Neg Hx    Esophageal cancer Neg Hx    Rectal cancer Neg Hx    Stomach cancer Neg Hx    Allergies  Allergen Reactions   Cetirizine Hcl Other (See Comments)    Not effective   Hydrocodone Itching    Have hypersensitivity  to most pain meds   Other    Cephalexin Rash   Penicillins Rash   Sulfa Antibiotics Rash   Current Outpatient Medications  Medication Sig Dispense Refill   Ascorbic Acid (VITAMIN C PO) Take 500 mg by mouth daily.     aspirin EC 325 MG tablet Take 1 tablet (325 mg total) by mouth daily. 30 tablet 0   Cholecalciferol (VITAMIN D-3) 125 MCG (5000 UT) TABS Take by mouth.     dicyclomine (BENTYL) 20 MG tablet TAKE (1) TABLET BY MOUTH (3) TIMES DAILY. (Patient taking differently: Take 20 mg by mouth daily as needed for spasms.) 90 tablet 3   ELDERBERRY PO Take 50 mg by mouth daily. Gummy     fluticasone (FLONASE) 50 MCG/ACT nasal spray Place 1 spray into both nostrils daily as needed for allergies.     hydrochlorothiazide (MICROZIDE) 12.5 MG capsule Take 12.5 mg by mouth daily.  1   HYDROmorphone (DILAUDID) 2 MG tablet Take 1 tablet (2 mg total) by mouth every 4 (four) hours as needed for severe pain. 30 tablet 0   levocetirizine (XYZAL) 5 MG tablet Take 5 mg by mouth every evening.      losartan (COZAAR) 50 MG tablet Take 50 mg by mouth daily.     saccharomyces boulardii (FLORASTOR) 250 MG capsule Take 500 mg by mouth daily.     vitamin E 180 MG (400 UNITS) capsule Take 400 Units by mouth daily.     No current facility-administered medications for this visit.   No results found.  Review of Systems:   A ROS was performed including pertinent positives and negatives as documented in the HPI.   Musculoskeletal Exam:    There were no vitals taken for this visit.  Left hip is well-healed.  Sensation is intact in all distributions.  Active range of motion of the left hip is 120 degrees of flexion with 20 degrees internal and external rotation.  Improved strength with abduction of the hip nearly equal to contralateral side.  She does still have a mild Trendelenburg gait.  There is tenderness about the ischial tuberosity with deep palpation as well as the abductor tubercle.  Distal neurosensory exam is intact with 2+ dorsalis pedis pulse  Imaging:    None  I personally reviewed and interpreted the radiographs.   Assessment:   Status post left hip gluteus medius repair.  Overall she is continuing to improve.  Her lateral based hip pain is improved but she is still having persistent pain about the ischial tuberosity as well as abductor origin.  At today's visit I described that I do believe she has multiple foci of pain.  Specifically I do believe that she is having tenderness over the proximal hamstrings which I do believe would benefit from continued shock therapy.  I did discuss that PRP treatment for this may also be an additional option if the shock is not able to provide significant relief.  She is having some swelling about the distal lateral aspect of the thigh which at today's visit I did not detect any fluid based on my ultrasound examination.  That effect I do believe some prolotherapy would be ideal with physical therapy about the distal lateral aspect of the thigh.  I  will plan to order this. Plan :    -PT ordered for left hip prolotherapy, she had seen Zenia Resides before where she was hoping to follow back up with -Return to clinic early summer if getting no relief with  conservative measures with Dr. Rolena Infante   I personally saw and evaluated the patient, and participated in the management and treatment plan.  Vanetta Mulders, MD Attending Physician, Orthopedic Surgery  This document was dictated using Dragon voice recognition software. A reasonable attempt at proof reading has been made to minimize errors.

## 2023-01-11 ENCOUNTER — Encounter: Payer: Self-pay | Admitting: Sports Medicine

## 2023-01-11 ENCOUNTER — Ambulatory Visit: Payer: No Typology Code available for payment source | Admitting: Sports Medicine

## 2023-01-11 ENCOUNTER — Other Ambulatory Visit: Payer: Self-pay

## 2023-01-11 DIAGNOSIS — S76012S Strain of muscle, fascia and tendon of left hip, sequela: Secondary | ICD-10-CM

## 2023-01-11 DIAGNOSIS — M76899 Other specified enthesopathies of unspecified lower limb, excluding foot: Secondary | ICD-10-CM | POA: Diagnosis not present

## 2023-01-11 DIAGNOSIS — M25552 Pain in left hip: Secondary | ICD-10-CM | POA: Diagnosis not present

## 2023-01-11 MED ORDER — NITROGLYCERIN 0.2 MG/HR TD PT24: MEDICATED_PATCH | TRANSDERMAL | 12 refills | Status: DC

## 2023-01-11 NOTE — Patient Instructions (Addendum)
Nitroglycerin Protocol  Apply 1/4 nitroglycerin patch to affected area of the lateral and posterior hip daily. Change position of patch within the affected area every 24 hours. You may experience a headache during the first 1-2 weeks of using the patch, these should subside. If you experience headaches after beginning nitroglycerin patch treatment, you may take your preferred over the counter pain reliever. Another side effect of the nitroglycerin patch is skin irritation or rash related to patch adhesive. Please notify our office if you develop more severe headaches or rash, and stop the patch. Tendon healing with nitroglycerin patch may require 12 to 24 weeks depending on the extent of injury. Men should not use if taking Viagra, Cialis, or Levitra.  Do not use if you have migraines or rosacea.

## 2023-01-11 NOTE — Progress Notes (Signed)
[name] - 52 y.o. female MRN [id number]  Date of birth: [birth year]  Office Visit Note: Visit Date: 01/11/2023 PCP: Lanelle Bal, PA-C Referred by: Lanelle Bal, PA-C  Subjective: Chief Complaint  Patient presents with   Left Hip - Pain   HPI: Rhonda Porter is a pleasant 52 y.o. female who presents today for follow-up of left lateral hip pain and gluteal pain.  She is status post left hip gluteus medius repair on 07/30/2022 by my partner, Dr. Sammuel Hines. She has had some improvement in strength with this, although still having some residual pain and swelling over the lateral hip.   She did see Dr. Sammuel Hines yesterday, who recommended referral to physical therapy for left hip prolotherapy over her scar tissue.  We have performed 1 session of extracorporeal shockwave therapy.  He notes some soreness for the first day or 2, but feels like this has slightly improved her pain.  She has been able to walk up and down her driveway for about 1/2 mile with less pain.  Still gets pain with prolonged sitting, although feels this may be slightly improved as well.  Pertinent ROS were reviewed with the patient and found to be negative unless otherwise specified above in HPI.   Assessment & Plan: Visit Diagnoses:  1. Tear of left gluteus medius tendon, sequela   2. Pain in left hip   3. Hamstring tendonitis at origin    Plan: Discussed with J[place] today that I think her hip pain is multifactorial and that she is dealing from some of the sequelae from her previous gluteus medius tendon tear with subsequent repair.  I also feel like she is dealing with some proximal hamstring tendinopathy at the origin of the ischial tuberosity.  We did repeat another trial of extracorporeal shockwave therapy to both areas.  Per Dr. Eddie Dibbles request, she will undergo some prolotherapy at physical therapy for the lateral hip to help with scar tissue.  I do think given the chronicity of this and her attempt at tendon  healing, we will proceed with nitroglycerin patch protocol - begin Nitroglycerin patch 0.2mg /hr.  She will place one fourth patch over the affected area and changes every 24 hours.  Discussed risk/benefits/indications today.  She will present next week for reevaluation and consideration of additional ECSWT, she will continue her home rehab exercises for the hip as well.  Follow-up: Return in about 1 week (around 01/18/2023) for hip pain.   Meds & Orders:  Meds ordered this encounter  Medications   nitroGLYCERIN (NITRODUR - DOSED IN MG/24 HR) 0.2 mg/hr patch    Sig: Cut the patch into fourths. Place 1/4 patch over affected area of the hip, change every 24 hours.    Dispense:  30 patch    Refill:  12    No orders of the defined types were placed in this encounter.    Procedures: Procedure: ECSWT Indications:  Lateral hip pain, proximal hamstring tendinopathy/itis   Procedure Details Consent: Risks of procedure as well as the alternatives and risks of each were explained to the patient.  Verbal consent for procedure obtained. Time Out: Verified patient identification, verified procedure, site was marked, verified correct patient position. The area was cleaned with alcohol swab.     The left lateral hip abductors and ischial tuberosity/proximal hamstring was targeted for Extracorporeal shockwave therapy.    Preset: Trochanteric bursitis Power Level: 70 mJ Frequency: 10 Hz Impulse/cycles: 3000 Head size: Regular   Patient tolerated procedure well without  immediate complications.      Clinical History: No specialty comments available.  She reports that she has been smoking cigarettes. She has a 34.00 pack-year smoking history. She has never used smokeless tobacco. No results for input(s): "HGBA1C", "LABURIC" in the last 8760 hours.  Objective:    Physical Exam  Gen: Well-appearing, in no acute distress; non-toxic CV:  Well-perfused. Warm.  Resp: Breathing unlabored on room air; no  wheezing. Psych: Fluid speech in conversation; appropriate affect; normal thought process Neuro: Sensation intact throughout. No gross coordination deficits.   Ortho Exam - Left hip: There is a well-healed incision over the lateral hip.  There is some tenderness around this area as well as palpating from both attachment sites of the gluteus medius and minimus tendon.  There is also some mild TTP palpating on the ischial tuberosity.  The lateral hip does have some mild soft tissue swelling that is tender to the touch, but this is improved from last visit; no significant warmth or redness.  There were no blocks to internal or external rotation about the hip.  There is some very mild weakness with resisted hip abduction on that hip compared to the contralateral hip, pain with resisted hamstring curl. NVI.   Imaging: No results found.  Past Medical/Family/Surgical/Social History: Medications & Allergies reviewed per EMR, new medications updated. Patient Active Problem List   Diagnosis Date Noted   Tear of left gluteus medius tendon    Lower abdominal pain 04/11/2017   Menopausal symptom 04/11/2017   Pain in pelvis 04/11/2017   LUQ abdominal pain 02/10/2015   Bloating 02/08/2015   Irritable bowel syndrome (IBS) 02/08/2015   Abdominal cramps 02/08/2015   DIVERTICULAR BLEEDING, HX OF 01/18/2009   HYPERTENSION 01/17/2009   COLONIC POLYPS, HYPERPLASTIC, HX OF 01/17/2009   Past Medical History:  Diagnosis Date   Diverticula of colon    Ganglion cyst    Gastritis    History of colonic polyps    Hypertension    on 03/08/15 pt denies high bp in over 10 yrs    IBS (irritable bowel syndrome)    Melanoma ([place]) 10/29/2004   seasonal allergies    Family History  Problem Relation Age of Onset   Colon polyps Mother    Hypertension Mother    Leukemia Maternal Grandmother    Lymphoma Maternal Grandmother    Colon cancer Neg Hx    Esophageal cancer Neg Hx    Rectal cancer Neg Hx    Stomach  cancer Neg Hx    Past Surgical History:  Procedure Laterality Date   ABDOMINAL HYSTERECTOMY  10/29/1997   CARPAL TUNNEL RELEASE Right    COLONOSCOPY  03/08/2015   FOOT SURGERY Bilateral    surg x 3 right, surg x 1 left   GLUTEUS MINIMUS REPAIR Left 07/30/2022   Procedure: LEFT GLUTEUS MEDIUS REPAIR;  Surgeon: Vanetta Mulders, MD;  Location: [facility];  Service: Orthopedics;  Laterality: Left;   KNEE ARTHROSCOPY Right 10/30/2011   MELANOMA EXCISION  10/29/2004   left shoulder   SPINAL FUSION     UPPER GASTROINTESTINAL ENDOSCOPY  03/08/2015   Dr.Pyrtle   WISDOM TOOTH EXTRACTION     Social History   Occupational History   Occupation: Freight forwarder: [employer]  Tobacco Use   Smoking status: Every Day    Packs/day: 1.00    Years: 34.00    Additional pack years: 0.00    Total pack years: 34.00  Types: Cigarettes   Smokeless tobacco: Never   Tobacco comments:    Quit at age 17yr  Vaping Use   Vaping Use: Never used  Substance and Sexual Activity   Alcohol use: Not Currently    Comment: once a month per pt   Drug use: No   Sexual activity: Yes    Birth control/protection: Surgical    Comment: Hysterectomy

## 2023-01-17 ENCOUNTER — Ambulatory Visit: Payer: No Typology Code available for payment source | Admitting: Sports Medicine

## 2023-01-17 ENCOUNTER — Encounter: Payer: Self-pay | Admitting: Sports Medicine

## 2023-01-17 DIAGNOSIS — M76899 Other specified enthesopathies of unspecified lower limb, excluding foot: Secondary | ICD-10-CM

## 2023-01-17 DIAGNOSIS — M25552 Pain in left hip: Secondary | ICD-10-CM | POA: Diagnosis not present

## 2023-01-17 DIAGNOSIS — S76012S Strain of muscle, fascia and tendon of left hip, sequela: Secondary | ICD-10-CM

## 2023-01-17 NOTE — Progress Notes (Signed)
Doing ok; unsure if treatments are helping States she is sore for 2 days following treatment that she is unsure how much benefit its doing

## 2023-01-17 NOTE — Progress Notes (Signed)
Rhonda Porter - 52 y.o. female MRN [id number]  Date of birth: 08/12/1971  Office Visit Note: Visit Date: 01/17/2023 PCP: Lanelle Bal, PA-C Referred by: Lanelle Bal, PA-C  Subjective: Chief Complaint  Patient presents with   Left Hip - Follow-up   HPI: Rhonda Porter is a pleasant 52 y.o. female who presents today for follow-up of left lateral hip pain and gluteal pain.  She is status post left hip gluteus medius repair on 07/30/2022 by my partner, Dr. Sammuel Hines.   We have performed 2 sessions of ECSWT to the lateral and posterior hip/glute. Had some soreness following this for the first 2 days or so, but then noticed mild improvement. Still having pain with walking any significant distance. Overall, feels like her pain is still similar. Most bothersome pain today is within the glute, ischial tuberosity region.   Has started nitroglycerin patch after last visit.  Pertinent ROS were reviewed with the patient and found to be negative unless otherwise specified above in HPI.   Assessment & Plan: Visit Diagnoses:  1. Tear of left gluteus medius tendon, sequela   2. Pain in left hip   3. Hamstring tendonitis at origin    Plan: Discussed with Sharifah today her left hip pain and associated treatment options. I do think she is making improvements with strength and function of the gluteus med/min s/p her repair. Still has some residual swelling that should get better with time. She is going to call her PT for the trial of prolotherapy/DN over this region. She will continue Nitroglycerin 0.2mg /hr patch q 24 hrs. At the 2 week mark, if tolerating well she will increase to 1/2 patch (instead of 1/4 patch). She unfortunately did not get great improvement from ECSWT, so we will hold from future treatments at this time. Discussed I do think she has ischial tuberosity/hamstring origin tendinitis - offered US-guided CSI, but declined for now which is reasonable. Will see how she does with HEP and formal PT  and keep both Dr. Sammuel Hines and myself updated over the coming months.  Follow-up: Return if symptoms worsen or fail to improve.   Meds & Orders: No orders of the defined types were placed in this encounter.  No orders of the defined types were placed in this encounter.   Procedures: N/a     Clinical History: No specialty comments available.  She reports that she has been smoking cigarettes. She has a 34.00 pack-year smoking history. She has never used smokeless tobacco. No results for input(s): "HGBA1C", "LABURIC" in the last 8760 hours.  Objective:   Vital Signs: There were no vitals taken for this visit.  Physical Exam  Gen: Well-appearing, in no acute distress; non-toxic CV: Well-perfused. Warm.  Resp: Breathing unlabored on room air; no wheezing. Psych: Fluid speech in conversation; appropriate affect; normal thought process Neuro: Sensation intact throughout. No gross coordination deficits.   Ortho Exam - Left hip: + TTP over the lateral hip around GT as well as most prominently within the ischial tuberosity on the left side and insertional hamstring origin. No SI joint TTP. No blocks to internal or external rotation about the hip.  There is some mild weakness with resisted hip abduction on that hip compared to the contralateral hip, pain with resisted hamstring curl. NVI.   Imaging: No results found.  Past Medical/Family/Surgical/Social History: Medications & Allergies reviewed per EMR, new medications updated. Patient Active Problem List   Diagnosis Date Noted   Tear of left gluteus medius tendon  Lower abdominal pain 04/11/2017   Menopausal symptom 04/11/2017   Pain in pelvis 04/11/2017   LUQ abdominal pain 02/10/2015   Bloating 02/08/2015   Irritable bowel syndrome (IBS) 02/08/2015   Abdominal cramps 02/08/2015   DIVERTICULAR BLEEDING, HX OF 01/18/2009   HYPERTENSION 01/17/2009   COLONIC POLYPS, HYPERPLASTIC, HX OF 01/17/2009   Past Medical History:   Diagnosis Date   Diverticula of colon    Ganglion cyst    Gastritis    History of colonic polyps    Hypertension    on 03/08/15 pt denies high bp in over 10 yrs    IBS (irritable bowel syndrome)    Melanoma ([place]) 10/29/2004   seasonal allergies    Family History  Problem Relation Age of Onset   Colon polyps Mother    Hypertension Mother    Leukemia Maternal Grandmother    Lymphoma Maternal Grandmother    Colon cancer Neg Hx    Esophageal cancer Neg Hx    Rectal cancer Neg Hx    Stomach cancer Neg Hx    Past Surgical History:  Procedure Laterality Date   ABDOMINAL HYSTERECTOMY  10/29/1997   CARPAL TUNNEL RELEASE Right    COLONOSCOPY  03/08/2015   FOOT SURGERY Bilateral    surg x 3 right, surg x 1 left   GLUTEUS MINIMUS REPAIR Left 07/30/2022   Procedure: LEFT GLUTEUS MEDIUS REPAIR;  Surgeon: Vanetta Mulders, MD;  Location: [facility];  Service: Orthopedics;  Laterality: Left;   KNEE ARTHROSCOPY Right 10/30/2011   MELANOMA EXCISION  10/29/2004   left shoulder   SPINAL FUSION     UPPER GASTROINTESTINAL ENDOSCOPY  03/08/2015   Dr.Pyrtle   WISDOM TOOTH EXTRACTION     Social History   Occupational History   Occupation: Freight forwarder: [employer]  Tobacco Use   Smoking status: Every Day    Packs/day: 1.00    Years: 34.00    Additional pack years: 0.00    Total pack years: 34.00    Types: Cigarettes   Smokeless tobacco: Never   Tobacco comments:    Quit at age 51yr  Vaping Use   Vaping Use: Never used  Substance and Sexual Activity   Alcohol use: Not Currently    Comment: once a month per pt   Drug use: No   Sexual activity: Yes    Birth control/protection: Surgical    Comment: Hysterectomy

## 2023-03-03 ENCOUNTER — Other Ambulatory Visit: Payer: Self-pay

## 2023-03-03 ENCOUNTER — Emergency Department (HOSPITAL_COMMUNITY): Payer: No Typology Code available for payment source

## 2023-03-03 ENCOUNTER — Emergency Department (HOSPITAL_COMMUNITY)
Admission: EM | Admit: 2023-03-03 | Discharge: 2023-03-03 | Disposition: A | Discharge status: Home / Self Care | Payer: No Typology Code available for payment source | Attending: Emergency Medicine | Admitting: Emergency Medicine

## 2023-03-03 ENCOUNTER — Encounter (HOSPITAL_COMMUNITY): Payer: Self-pay

## 2023-03-03 DIAGNOSIS — R030 Elevated blood-pressure reading, without diagnosis of hypertension: Secondary | ICD-10-CM

## 2023-03-03 DIAGNOSIS — Z79899 Other long term (current) drug therapy: Secondary | ICD-10-CM | POA: Diagnosis not present

## 2023-03-03 DIAGNOSIS — Z7982 Long term (current) use of aspirin: Secondary | ICD-10-CM | POA: Diagnosis not present

## 2023-03-03 DIAGNOSIS — R079 Chest pain, unspecified: Secondary | ICD-10-CM | POA: Diagnosis present

## 2023-03-03 DIAGNOSIS — R072 Precordial pain: Secondary | ICD-10-CM

## 2023-03-03 DIAGNOSIS — I10 Essential (primary) hypertension: Secondary | ICD-10-CM

## 2023-03-03 LAB — BASIC METABOLIC PANEL
Anion gap: 10 (ref 5–15)
BUN: 10 mg/dL (ref 6–20)
CO2: 25 mmol/L (ref 22–32)
Calcium: 9.7 mg/dL (ref 8.9–10.3)
Chloride: 103 mmol/L (ref 98–111)
Creatinine, Ser: 0.76 mg/dL (ref 0.44–1.00)
GFR, Estimated: >60 mL/min (ref >60)
Glucose, Bld: 134 mg/dL — ABNORMAL HIGH (ref 70–99)
Potassium: 3.5 mmol/L (ref 3.5–5.1)
Sodium: 138 mmol/L (ref 135–145)

## 2023-03-03 LAB — CBC
HCT: 45.7 % (ref 36.0–46.0)
Hemoglobin: 15.2 g/dL — ABNORMAL HIGH (ref 12.0–15.0)
MCH: 30.3 pg (ref 26.0–34.0)
MCHC: 33.3 g/dL (ref 30.0–36.0)
MCV: 91.2 fL (ref 80.0–100.0)
Platelets: 471 10*3/uL — ABNORMAL HIGH (ref 150–400)
RBC: 5.01 MIL/uL (ref 3.87–5.11)
RDW: 14 % (ref 11.5–15.5)
WBC: 17 10*3/uL — ABNORMAL HIGH (ref 4.0–10.5)
nRBC: 0 % (ref 0.0–0.2)

## 2023-03-03 LAB — TROPONIN I (HIGH SENSITIVITY)
Troponin I (High Sensitivity): 3 ng/L (ref <18)
Troponin I (High Sensitivity): 3 ng/L (ref <18)

## 2023-03-03 MED ORDER — FAMOTIDINE 20 MG PO TABS
20.0000 mg | ORAL_TABLET | Freq: Once | ORAL | Status: AC
  Administered 2023-03-03: 20 mg via ORAL
  Filled 2023-03-03: qty 1

## 2023-03-03 MED ORDER — ACETAMINOPHEN 500 MG PO TABS
1000.0000 mg | ORAL_TABLET | Freq: Once | ORAL | Status: AC
  Administered 2023-03-03: 1000 mg via ORAL
  Filled 2023-03-03: qty 2

## 2023-03-03 MED ORDER — ALUM & MAG HYDROXIDE-SIMETH 200-200-20 MG/5ML PO SUSP
30.0000 mL | Freq: Once | ORAL | Status: AC
  Administered 2023-03-03: 30 mL via ORAL
  Filled 2023-03-03: qty 30

## 2023-03-03 NOTE — ED Provider Notes (Signed)
[place] EMERGENCY DEPARTMENT AT [facility] Provider Note   CSN: [id number] Arrival date & time: 03/03/23  0865     History  Chief Complaint  Patient presents with   Chest Pain    Rhonda Porter is a 52 y.o. female.  Patient with c/o mid chest pain in past couple days. Occurs at rest, dull/pressure midline, mid to lower chest, non radiating, not pleuritic. No associated sob, nv or diaphoresis. ?hx gerd. No hx cad, or fam hx cad. Denies any exertional chest pain or discomfort. No leg pain or swelling. No severe cough, sore throat, fever or uri symptoms.   The history is provided by the patient, the EMS personnel and medical records.  Chest Pain Associated symptoms: no abdominal pain, no back pain, no fever, no headache, no nausea, no palpitations, no shortness of breath and no vomiting        Home Medications Prior to Admission medications   Medication Sig Start Date End Date Taking? Authorizing Provider  Ascorbic Acid (VITAMIN C PO) Take 500 mg by mouth daily.    [provider]  aspirin EC 325 MG tablet Take 1 tablet (325 mg total) by mouth daily. 07/30/22   Huel Cote, MD  Cholecalciferol (VITAMIN D-3) 125 MCG (5000 UT) TABS Take by mouth.    [provider]  dicyclomine (BENTYL) 20 MG tablet TAKE (1) TABLET BY MOUTH (3) TIMES DAILY. Patient taking differently: Take 20 mg by mouth daily as needed for spasms. 12/14/21   Pyrtle, Carie Caddy, MD  ELDERBERRY PO Take 50 mg by mouth daily. Gummy    [provider]  fluticasone (FLONASE) 50 MCG/ACT nasal spray Place 1 spray into both nostrils daily as needed for allergies.    [provider]  hydrochlorothiazide (MICROZIDE) 12.5 MG capsule Take 12.5 mg by mouth daily. 08/20/18   [provider]  HYDROmorphone (DILAUDID) 2 MG tablet Take 1 tablet (2 mg total) by mouth every 4 (four) hours as needed for severe pain. 07/30/22   Huel Cote, MD  levocetirizine (XYZAL) 5 MG tablet  Take 5 mg by mouth every evening.    [provider]  losartan (COZAAR) 50 MG tablet Take 50 mg by mouth daily. 09/16/21   [provider]  nitroGLYCERIN (NITRODUR - DOSED IN MG/24 HR) 0.2 mg/hr patch Cut the patch into fourths. Place 1/4 patch over affected area of the hip, change every 24 hours. 01/11/23   Madelyn Brunner, DO  saccharomyces boulardii (FLORASTOR) 250 MG capsule Take 500 mg by mouth daily.    [provider]  vitamin E 180 MG (400 UNITS) capsule Take 400 Units by mouth daily.    [provider]      Allergies    Cetirizine hcl, Hydrocodone, Other, Cephalexin, Penicillins, and Sulfa antibiotics    Review of Systems   Review of Systems  Constitutional:  Negative for fever.  HENT:  Negative for sore throat.   Eyes:  Negative for redness.  Respiratory:  Negative for shortness of breath.   Cardiovascular:  Positive for chest pain. Negative for palpitations and leg swelling.  Gastrointestinal:  Negative for abdominal pain, nausea and vomiting.  Genitourinary:  Negative for flank pain.  Musculoskeletal:  Negative for back pain and neck pain.  Skin:  Negative for rash.  Neurological:  Negative for headaches.    Physical Exam Updated Vital Signs BP (!) 145/95   Pulse 98   Resp 20   SpO2 99%  Physical Exam Vitals  and nursing note reviewed.  Constitutional:      Appearance: Normal appearance. She is well-developed.  HENT:     Head: Atraumatic.     Nose: Nose normal.     Mouth/Throat:     Mouth: Mucous membranes are moist.  Eyes:     General: No scleral icterus.    Conjunctiva/sclera: Conjunctivae normal.  Neck:     Trachea: No tracheal deviation.  Cardiovascular:     Rate and Rhythm: Normal rate and regular rhythm.     Pulses: Normal pulses.     Heart sounds: Normal heart sounds. No murmur heard.    No friction rub. No gallop.  Pulmonary:     Effort: Pulmonary effort is normal. No respiratory distress.     Breath sounds: Normal  breath sounds.  Abdominal:     General: Bowel sounds are normal. There is no distension.     Palpations: Abdomen is soft.     Tenderness: There is no abdominal tenderness.  Musculoskeletal:        General: No swelling or tenderness.     Cervical back: Normal range of motion and neck supple. No rigidity. No muscular tenderness.     Right lower leg: No edema.     Left lower leg: No edema.  Skin:    General: Skin is warm and dry.     Findings: No rash.  Neurological:     Mental Status: She is alert.     Comments: Alert, speech normal.   Psychiatric:        Mood and Affect: Mood normal.     ED Results / Procedures / Treatments   Labs (all labs ordered are listed, but only abnormal results are displayed) Results for orders placed or performed during the hospital encounter of 03/03/23  Basic metabolic panel  Result Value Ref Range   Sodium 138 135 - 145 mmol/L   Potassium 3.5 3.5 - 5.1 mmol/L   Chloride 103 98 - 111 mmol/L   CO2 25 22 - 32 mmol/L   Glucose, Bld 134 (H) 70 - 99 mg/dL   BUN 10 6 - 20 mg/dL   Creatinine, Ser 1.61 0.44 - 1.00 mg/dL   Calcium 9.7 8.9 - 09.6 mg/dL   GFR, Estimated >04 >54 mL/min   Anion gap 10 5 - 15  CBC  Result Value Ref Range   WBC 17.0 (H) 4.0 - 10.5 K/uL   RBC 5.01 3.87 - 5.11 MIL/uL   Hemoglobin 15.2 (H) 12.0 - 15.0 g/dL   HCT 09.8 11.9 - 14.7 %   MCV 91.2 80.0 - 100.0 fL   MCH 30.3 26.0 - 34.0 pg   MCHC 33.3 30.0 - 36.0 g/dL   RDW 82.9 56.2 - 13.0 %   Platelets 471 (H) 150 - 400 K/uL   nRBC 0.0 0.0 - 0.2 %  Troponin I (High Sensitivity)  Result Value Ref Range   Troponin I (High Sensitivity) 3 <18 ng/L  Troponin I (High Sensitivity)  Result Value Ref Range   Troponin I (High Sensitivity) 3 <18 ng/L   DG Chest Port 1 View  Result Date: 03/03/2023 CLINICAL DATA:  Chest pain/pressure for several days that comes and goes. Also short of breath. EXAM: PORTABLE CHEST 1 VIEW COMPARISON:  01/03/2009. FINDINGS: Cardiac silhouette is normal  in size. No mediastinal or hilar masses. Clear lungs.  No pleural effusion or pneumothorax. Skeletal structures are grossly intact. IMPRESSION: No active disease. Electronically Signed   By: Amie Portland  M.D.   On: 03/03/2023 08:53     EKG EKG Interpretation  Date/Time:  Sunday Mar 03 2023 08:40:45 EDT Ventricular Rate:  100 PR Interval:  129 QRS Duration: 88 QT Interval:  335 QTC Calculation: 432 R Axis:   50 Text Interpretation: Sinus tachycardia Low voltage, precordial leads Confirmed by Cathren Laine ([id number]) on 03/03/2023 11:03:33 AM  Radiology DG Chest Port 1 View  Result Date: 03/03/2023 CLINICAL DATA:  Chest pain/pressure for several days that comes and goes. Also short of breath. EXAM: PORTABLE CHEST 1 VIEW COMPARISON:  01/03/2009. FINDINGS: Cardiac silhouette is normal in size. No mediastinal or hilar masses. Clear lungs.  No pleural effusion or pneumothorax. Skeletal structures are grossly intact. IMPRESSION: No active disease. Electronically Signed   By: Amie Portland M.D.   On: 03/03/2023 08:53    Procedures Procedures    Medications Ordered in ED Medications - No data to display  ED Course/ Medical Decision Making/ A&P                             Medical Decision Making Problems Addressed: Elevated blood pressure reading: acute illness or injury Essential hypertension: chronic illness or injury with exacerbation, progression, or side effects of treatment that poses a threat to life or bodily functions Precordial chest pain: acute illness or injury with systemic symptoms that poses a threat to life or bodily functions  Amount and/or Complexity of Data Reviewed Independent Historian: EMS    Details: hx External Data Reviewed: notes. Labs: ordered. Decision-making details documented in ED Course. Radiology: ordered and independent interpretation performed. Decision-making details documented in ED Course. ECG/medicine tests: ordered and independent interpretation  performed. Decision-making details documented in ED Course.  Risk OTC drugs. Decision regarding hospitalization.   Iv ns. Continuous pulse ox and cardiac monitoring. Labs ordered/sent. Imaging ordered.   Differential diagnosis includes acs, msk cp, gi cp, etc. Dispo decision including potential need for admission considered - will get labs and imaging and reassess.   Reviewed nursing notes and prior charts for additional history. External reports reviewed. Additional history from: EMS/fam.  Cardiac monitor: sinus rhythm, rate 88.  Labs reviewed/interpreted by me - trop normal.   Xrays reviewed/interpreted by me - no pna.   Acetaminophen po, maalox/pepcid po for symptom relief.   Additional labs reviewed/interpreted by me - delta trop normal/not increased, felt not c/w acs.   Recheck no cp or sob. Pt appears stable for d/c.   Rec close pcp f/u.  Return precautions provided.          Final Clinical Impression(s) / ED Diagnoses Final diagnoses:  None    Rx / DC Orders ED Discharge Orders     None         Cathren Laine, MD 03/03/23 1104

## 2023-03-03 NOTE — ED Triage Notes (Signed)
[facility] reports pt coming from home. Pt c/o chest pain/pressure for the past few days that comes and goes. Also c/o sob. Pt took 1 adult ASA prior to EMS arrival.

## 2023-03-03 NOTE — Discharge Instructions (Addendum)
It was our pleasure to provide your ER care today - we hope that you feel better.  If GI/reflux symptoms, try maalox and pepcid as need for symptom relief.   Follow up closely with primary care doctor/cardiologist in the coming week - also have your blood pressure rechecked then, as it is high today.  Return to ER if worse, new symptoms, recurrent or persistent chest pain, increased trouble breathing, or other emergency concern.

## 2023-03-15 ENCOUNTER — Encounter: Payer: Self-pay | Admitting: Cardiology

## 2023-03-15 ENCOUNTER — Ambulatory Visit: Payer: No Typology Code available for payment source | Attending: Cardiology | Admitting: Cardiology

## 2023-03-15 ENCOUNTER — Ambulatory Visit: Payer: No Typology Code available for payment source | Admitting: Internal Medicine

## 2023-03-15 ENCOUNTER — Telehealth: Payer: Self-pay | Admitting: Internal Medicine

## 2023-03-15 VITALS — BP 102/68 | HR 86 | Ht 60.0 in | Wt 180.0 lb

## 2023-03-15 DIAGNOSIS — R0789 Other chest pain: Secondary | ICD-10-CM | POA: Diagnosis not present

## 2023-03-15 NOTE — Telephone Encounter (Signed)
Spoke with pt and let her know it is fine for her to continue taking pepcid until her OV.

## 2023-03-15 NOTE — Patient Instructions (Signed)
Medication Instructions:  Your physician recommends that you continue on your current medications as directed. Please refer to the Current Medication list given to you today.  *If you need a refill on your cardiac medications before your next appointment, please call your pharmacy*   Lab Work: None If you have labs (blood work) drawn today and your tests are completely normal, you will receive your results only by: MyChart Message (if you have MyChart) OR A paper copy in the mail If you have any lab test that is abnormal or we need to change your treatment, we will call you to review the results.   Testing/Procedures: None   Follow-Up: At [facility], you and your health needs are our priority.  As part of our continuing mission to provide you with exceptional heart care, we have created designated Provider Care Teams.  These Care Teams include your primary Cardiologist (physician) and Advanced Practice Providers (APPs -  Physician Assistants and Nurse Practitioners) who all work together to provide you with the care you need, when you need it.  We recommend signing up for the patient portal called "MyChart".  Sign up information is provided on this After Visit Summary.  MyChart is used to connect with patients for Virtual Visits (Telemedicine).  Patients are able to view lab/test results, encounter notes, upcoming appointments, etc.  Non-urgent messages can be sent to your provider as well.   To learn more about what you can do with MyChart, go to [email or web address].    Your next appointment:    Follow up as needed.   Provider:   Jonathan Branch, MD    Other Instructions    

## 2023-03-15 NOTE — Telephone Encounter (Signed)
Patient was supposed to have an appointment with Dr. Rhea Belton this morning. She rescheduled to 06/04/23.   She states that she had to go to the ED and was diagnosed with indigestion. She says that she is now taking pepcid 20 mg, one in the morning and one at night and is wanting to know if that is okay until she see's Dr. Rhea Belton.

## 2023-03-15 NOTE — Progress Notes (Signed)
Clinical Summary Ms. Jungling is a 52 y.o.female seen today as a new consult, referred by PA Noralyn Pick for the following medical problems   1.Chest pain - ER visit 03/03/23 -trop neg x2, EKG SR no ischemi changes. CXR no acute process  - pain started few days prior - she had mowed the yard on riding mower -1 hour after eating dinner chest started hurting while sitting watching tv - soreness midchest, 4/10 in severity. No other symptoms. Better with sitting up.  - after 30 minutes took one of her husbands omeprazoles, did not improve. Went to bed, elevated head of bed. Went to bed 10 to 1030pm, pain had started around 9pm.  - slept off and on. Woke up around 6AM, still with some discomfort but not resolved.  - took some tylenol - 930AM and pain resolved. Felt good rest of day. That night symptoms recurred after eating dinner.  - took tums did not help, pain progressed. 10/10 in severity. Constant overnight pain. Woke up Sunday and went to ER. Troubles swallowing coffeen in AM.  - has been taking pepcid bid without recurrence of symptoms     CAD risk factors:HTN, +smoker x 36 year      Past Medical History:  Diagnosis Date   Diverticula of colon    Ganglion cyst    Gastritis    History of colonic polyps    Hypertension    on 03/08/15 pt denies high bp in over 10 yrs    IBS (irritable bowel syndrome)    Melanoma ([facility]) 10/29/2004   seasonal allergies      Allergies  Allergen Reactions   Cetirizine Hcl Other (See Comments)    Not effective   Hydrocodone Itching    Have hypersensitivity to most pain meds   Other    Cephalexin Rash   Penicillins Rash   Sulfa Antibiotics Rash     Current Outpatient Medications  Medication Sig Dispense Refill   Ascorbic Acid (VITAMIN C PO) Take 500 mg by mouth daily.     aspirin EC 325 MG tablet Take 1 tablet (325 mg total) by mouth daily. 30 tablet 0   Cholecalciferol (VITAMIN D-3) 125 MCG (5000 UT) TABS Take by mouth.      dicyclomine (BENTYL) 20 MG tablet TAKE (1) TABLET BY MOUTH (3) TIMES DAILY. (Patient taking differently: Take 20 mg by mouth daily as needed for spasms.) 90 tablet 3   ELDERBERRY PO Take 50 mg by mouth daily. Gummy     fluticasone (FLONASE) 50 MCG/ACT nasal spray Place 1 spray into both nostrils daily as needed for allergies.     hydrochlorothiazide (MICROZIDE) 12.5 MG capsule Take 12.5 mg by mouth daily.  1   HYDROmorphone (DILAUDID) 2 MG tablet Take 1 tablet (2 mg total) by mouth every 4 (four) hours as needed for severe pain. 30 tablet 0   levocetirizine (XYZAL) 5 MG tablet Take 5 mg by mouth every evening.     losartan (COZAAR) 50 MG tablet Take 50 mg by mouth daily.     nitroGLYCERIN (NITRODUR - DOSED IN MG/24 HR) 0.2 mg/hr patch Cut the patch into fourths. Place 1/4 patch over affected area of the hip, change every 24 hours. 30 patch 12   saccharomyces boulardii (FLORASTOR) 250 MG capsule Take 500 mg by mouth daily.     vitamin E 180 MG (400 UNITS) capsule Take 400 Units by mouth daily.     No current facility-administered medications for this visit.  Past Surgical History:  Procedure Laterality Date   ABDOMINAL HYSTERECTOMY  10/29/1997   CARPAL TUNNEL RELEASE Right    COLONOSCOPY  03/08/2015   FOOT SURGERY Bilateral    surg x 3 right, surg x 1 left   GLUTEUS MINIMUS REPAIR Left 07/30/2022   Procedure: LEFT GLUTEUS MEDIUS REPAIR;  Surgeon: Huel Cote, MD;  Location: [facility];  Service: Orthopedics;  Laterality: Left;   KNEE ARTHROSCOPY Right 10/30/2011   MELANOMA EXCISION  10/29/2004   left shoulder   SPINAL FUSION     UPPER GASTROINTESTINAL ENDOSCOPY  03/08/2015   Dr.Pyrtle   WISDOM TOOTH EXTRACTION       Allergies  Allergen Reactions   Cetirizine Hcl Other (See Comments)    Not effective   Hydrocodone Itching    Have hypersensitivity to most pain meds   Other    Cephalexin Rash   Penicillins Rash   Sulfa Antibiotics Rash      Family History  Problem  Relation Age of Onset   Colon polyps Mother    Hypertension Mother    Leukemia Maternal Grandmother    Lymphoma Maternal Grandmother    Colon cancer Neg Hx    Esophageal cancer Neg Hx    Rectal cancer Neg Hx    Stomach cancer Neg Hx      Social History Ms. Braly reports that she has been smoking cigarettes. She has a 34.00 pack-year smoking history. She has never used smokeless tobacco. Ms. Sabatini reports that she does not currently use alcohol.   Review of Systems CONSTITUTIONAL: No weight loss, fever, chills, weakness or fatigue.  HEENT: Eyes: No visual loss, blurred vision, double vision or yellow sclerae.No hearing loss, sneezing, congestion, runny nose or sore throat.  SKIN: No rash or itching.  CARDIOVASCULAR: per hpi RESPIRATORY: No shortness of breath, cough or sputum.  GASTROINTESTINAL: No anorexia, nausea, vomiting or diarrhea. No abdominal pain or blood.  GENITOURINARY: No burning on urination, no polyuria NEUROLOGICAL: No headache, dizziness, syncope, paralysis, ataxia, numbness or tingling in the extremities. No change in bowel or bladder control.  MUSCULOSKELETAL: No muscle, back pain, joint pain or stiffness.  LYMPHATICS: No enlarged nodes. No history of splenectomy.  PSYCHIATRIC: No history of depression or anxiety.  ENDOCRINOLOGIC: No reports of sweating, cold or heat intolerance. No polyuria or polydipsia.  Marland Kitchen   Physical Examination Today's Vitals   03/15/23 1313  BP: 102/68  Pulse: 86  SpO2: 97%  Weight: 180 lb (81.6 kg)  Height: 5' (1.524 m)   Body mass index is 35.15 kg/m.  Gen: resting comfortably, no acute distress HEENT: no scleral icterus, pupils equal round and reactive, no palptable cervical adenopathy,  CV: RRR, no m/rg, no jvd Resp: Clear to auscultation bilaterally GI: abdomen is soft, non-tender, non-distended, normal bowel sounds, no hepatosplenomegaly MSK: extremities are warm, no edema.  Skin: warm, no rash Neuro:  no focal  deficits Psych: appropriate affect   Diagnostic Studies     Assessment and Plan  1.Noncardiac chest pain - symptoms are noncardiac, most consistent with GI etiology. Resolved with bid pepcid - no further cardiac testing is indicated. If recurrence may require further evaluation with her GI physician   F/u just as needed.       Antoine Poche, M.D.

## 2023-06-04 ENCOUNTER — Encounter: Payer: Self-pay | Admitting: Internal Medicine

## 2023-06-04 ENCOUNTER — Ambulatory Visit: Payer: No Typology Code available for payment source | Admitting: Internal Medicine

## 2023-06-04 VITALS — BP 110/80 | HR 81 | Ht 60.0 in | Wt 179.0 lb

## 2023-06-04 DIAGNOSIS — K2100 Gastro-esophageal reflux disease with esophagitis, without bleeding: Secondary | ICD-10-CM

## 2023-06-04 DIAGNOSIS — K589 Irritable bowel syndrome without diarrhea: Secondary | ICD-10-CM

## 2023-06-04 MED ORDER — FAMOTIDINE 20 MG PO TABS: 20.0000 mg | ORAL_TABLET | Freq: Every evening | ORAL | 3 refills | Status: AC | PRN

## 2023-06-04 MED ORDER — DICYCLOMINE HCL 20 MG PO TABS: 20.0000 mg | ORAL_TABLET | Freq: Every day | ORAL | 3 refills | Status: AC | PRN

## 2023-06-04 MED ORDER — PANTOPRAZOLE SODIUM 40 MG PO TBEC: 40.0000 mg | DELAYED_RELEASE_TABLET | Freq: Every day | ORAL | 3 refills | Status: DC

## 2023-06-04 NOTE — Progress Notes (Signed)
   Subjective:    Patient ID: Rhonda Porter, female    DOB: 10/13/1971, 52 y.o.   MRN: [id number]  HPI Rajene Quinter is a 52 year old female with a history of IBS with intermittent loose stools and abdominal cramping, history of diverticular hemorrhage, GERD who is here for follow-up.  She is here alone today and was last here in July 2022 for her colonoscopy.  She reports she has been doing well.  She did have issues with recurrent indigestion and heartburn.  This became severe in May of this year and she went to the ER to ensure she was not having a heart attack.  Cardiac etiology was excluded and she responded very favorably to a GI cocktail.  Since that time she has been using famotidine 20 mg twice daily.  Even with this she still has intermittent sour brash and she will use Mylanta with benefit.  Prior to starting famotidine she was having the sensation that food would not always go down easily.  From a diet perspective she avoids peanuts, almonds and broccoli.  She has had no further blood in stool.  Bowel movements have been regular.  She does occasionally use Bentyl with success   Review of Systems As per HPI, otherwise negative  Current Medications, Allergies, Past Medical History, Past Surgical History, Family History and Social History were reviewed in [employer] record.    Objective:   Physical Exam BP 110/80   Pulse 81   Ht 5' (1.524 m)   Wt 179 lb (81.2 kg)   BMI 34.96 kg/m  Gen: awake, alert, NAD HEENT: anicteric  Neuro: nonfocal     Assessment & Plan:  52 year old female with a history of IBS with intermittent loose stools and abdominal cramping, history of diverticular hemorrhage, GERD who is here for follow-up.   GERD --we discussed that in 2016 she had an upper endoscopy which showed mild esophagitis.  She definitively has acid reflux.  She is using twice daily H2 blocker with some breakthrough symptoms now.  We will retry PPI --Pantoprazole 40  mg daily --She can use famotidine 20 mg in the evening on an as-needed basis or liquid over-the-counter Gaviscon per bottle instruction  2.  IBS --doing well overall.  She has certain trigger food she avoids.  She does continue Florastor 250 mg daily which she finds helpful --Bentyl 20 mg 3 times a day as needed for crampy abdominal pain  3.  Colon cancer screening --distal hyperplastic polyps only a colonoscopy in July 2022 --Repeat screening colonoscopy recommended in July 2032  Follow-up in 1 to 2 years, sooner if needed  30 minutes total spent today including patient facing time, coordination of care, reviewing medical history/procedures/pertinent radiology studies, and documentation of the encounter.

## 2023-06-04 NOTE — Patient Instructions (Signed)
We have sent the following medications to your pharmacy for you to pick up at your convenience: pantoprazole 40 mg daily and pepcid 20 mg at bedtime as needed.   You can also take over the counter Gaviscon as needed for breakthrough symptoms.   _______________________________________________________  If your blood pressure at your visit was 140/90 or greater, please contact your primary care physician to follow up on this.  _______________________________________________________  If you are age 95 or older, your body mass index should be between 23-30. Your Body mass index is 34.96 kg/m. If this is out of the aforementioned range listed, please consider follow up with your Primary Care Provider.  If you are age 79 or younger, your body mass index should be between 19-25. Your Body mass index is 34.96 kg/m. If this is out of the aformentioned range listed, please consider follow up with your Primary Care Provider.   ________________________________________________________  The [facility] providers would like to encourage you to use [facility] to communicate with providers for non-urgent requests or questions.  Due to long hold times on the telephone, sending your provider a message by [facility] may be a faster and more efficient way to get a response.  Please allow 48 business hours for a response.  Please remember that this is for non-urgent requests.  _______________________________________________________

## 2023-07-05 ENCOUNTER — Ambulatory Visit (HOSPITAL_BASED_OUTPATIENT_CLINIC_OR_DEPARTMENT_OTHER): Payer: No Typology Code available for payment source | Admitting: Orthopaedic Surgery

## 2023-07-05 DIAGNOSIS — S73192A Other sprain of left hip, initial encounter: Secondary | ICD-10-CM | POA: Diagnosis not present

## 2023-07-05 NOTE — Progress Notes (Signed)
Post Operative Evaluation    Procedure/Date of Surgery: Left hip gluteus medius repair 07/30/22  Interval History:   Presents today for follow-up of her left hip.  At today's visit her pain is predominantly in the groin area.  This is rating to the back of the thigh.  She did previously have an injection in the groin with significant relief.  She is here today for further discussion.  She has been trialing physical therapy in the past but little relief.  PMH/PSH/Family History/Social History/Meds/Allergies:    Past Medical History:  Diagnosis Date   Diverticula of colon    Ganglion cyst    Gastritis    History of colonic polyps    Hypertension    on 03/08/15 pt denies high bp in over 10 yrs    IBS (irritable bowel syndrome)    Melanoma (HCC) 10/29/2004   seasonal allergies    Past Surgical History:  Procedure Laterality Date   ABDOMINAL HYSTERECTOMY  10/29/1997   CARPAL TUNNEL RELEASE Right    COLONOSCOPY  03/08/2015   FOOT SURGERY Bilateral    surg x 3 right, surg x 1 left   GLUTEUS MINIMUS REPAIR Left 07/30/2022   Procedure: LEFT GLUTEUS MEDIUS REPAIR;  Surgeon: Huel Cote, MD;  Location: [facility] OR;  Service: Orthopedics;  Laterality: Left;   KNEE ARTHROSCOPY Right 10/30/2011   MELANOMA EXCISION  10/29/2004   left shoulder   SPINAL FUSION     UPPER GASTROINTESTINAL ENDOSCOPY  03/08/2015   Dr.Pyrtle   WISDOM TOOTH EXTRACTION     Social History   Socioeconomic History   Marital status: Married    Spouse name: Not on file   Number of children: 2   Years of education: Not on file   Highest education level: Not on file  Occupational History   Occupation: Radio broadcast assistant: [employer]  Tobacco Use   Smoking status: Every Day    Current packs/day: 1.00    Average packs/day: 1 pack/day for 34.0 years (34.0 ttl pk-yrs)    Types: Cigarettes   Smokeless tobacco: Never   Tobacco comments:    Quit at age 63yr  Vaping Use    Vaping status: Never Used  Substance and Sexual Activity   Alcohol use: Not Currently    Comment: once a month per pt   Drug use: No   Sexual activity: Yes    Birth control/protection: Surgical    Comment: Hysterectomy  Other Topics Concern   Not on file  Social History Narrative   Not on file   Social Determinants of Health   Financial Resource Strain: Not on file  Food Insecurity: Not on file  Transportation Needs: Not on file  Physical Activity: Not on file  Stress: Not on file  Social Connections: Not on file   Family History  Problem Relation Age of Onset   Colon polyps Mother    Hypertension Mother    Leukemia Maternal Grandmother    Lymphoma Maternal Grandmother    Colon cancer Neg Hx    Esophageal cancer Neg Hx    Rectal cancer Neg Hx    Stomach cancer Neg Hx    Allergies  Allergen Reactions   Cetirizine Hcl Other (See Comments)    Not effective   Hydrocodone Itching  Have hypersensitivity to most pain meds   Other    Cephalexin Rash   Penicillins Rash   Sulfa Antibiotics Rash   Current Outpatient Medications  Medication Sig Dispense Refill   Ascorbic Acid (VITAMIN C PO) Take 500 mg by mouth daily.     Cholecalciferol (VITAMIN D-3) 125 MCG (5000 UT) TABS Take by mouth.     dicyclomine (BENTYL) 20 MG tablet Take 1 tablet (20 mg total) by mouth daily as needed for spasms. 90 tablet 3   ELDERBERRY PO Take 50 mg by mouth daily. Gummy     famotidine (PEPCID) 20 MG tablet Take 1 tablet (20 mg total) by mouth at bedtime as needed for heartburn or indigestion. 90 tablet 3   fluticasone (FLONASE) 50 MCG/ACT nasal spray Place 1 spray into both nostrils daily as needed for allergies.     levocetirizine (XYZAL) 5 MG tablet Take 5 mg by mouth every evening.     losartan-hydrochlorothiazide (HYZAAR) 50-12.5 MG tablet Take 1 tablet by mouth daily.     pantoprazole (PROTONIX) 40 MG tablet Take 1 tablet (40 mg total) by mouth daily. 90 tablet 3   saccharomyces  boulardii (FLORASTOR) 250 MG capsule Take 500 mg by mouth daily.     vitamin E 180 MG (400 UNITS) capsule Take 400 Units by mouth daily.     No current facility-administered medications for this visit.   No results found.  Review of Systems:   A ROS was performed including pertinent positives and negatives as documented in the HPI.   Musculoskeletal Exam:    There were no vitals taken for this visit.  Left hip is well-healed.  Sensation is intact in all distributions.  Positive FADIR maneuver with 30 degrees internal rotation with the hip in the 100 degrees of flexion.  External rotation is 45.  improved strength with abduction of the hip nearly equal to contralateral side.  She does still have a mild Trendelenburg gait.  There is tenderness about the ischial tuberosity with deep palpation as well as the abductor tubercle.  Distal neurosensory exam is intact with 2+ dorsalis pedis pulse  Imaging:    MRI left hip: There is a tear involving the anterior superior labrum left hip  I personally reviewed and interpreted the radiographs.   Assessment:   Status post left hip gluteus medius repair.  At today's visit she has having symptoms more consistent with labral pathology.  She does have a labral tear seen on MRI.  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  She has had an injection as well as now multiple months of physical therapy.  Given that we did discuss the possibility of left hip arthroscopy with labral repair.  She would like to think about this and consider this more Plan :    -She will send [place] a MyChart message should she want to consider left hip arthroscopy with labral debridement repair   I personally saw and evaluated the patient, and participated in the management and treatment plan.  Huel Cote, MD Attending Physician, Orthopedic Surgery  This document was dictated using Dragon voice  recognition software. A reasonable attempt at proof reading has been made to minimize errors.

## 2023-10-18 ENCOUNTER — Other Ambulatory Visit: Payer: Self-pay

## 2023-11-27 ENCOUNTER — Telehealth (INDEPENDENT_AMBULATORY_CARE_PROVIDER_SITE_OTHER): Payer: Self-pay

## 2023-11-27 ENCOUNTER — Other Ambulatory Visit (HOSPITAL_COMMUNITY): Payer: Self-pay

## 2023-11-27 NOTE — Telephone Encounter (Signed)
Clinical questions have been submitted and PA has been submitted.

## 2023-11-27 NOTE — Telephone Encounter (Signed)
Pharmacy Patient Advocate Encounter  Received notification from [facility] that Prior Authorization for PANTOPRAZOLE 40MG  has been APPROVED from 1.29.25 to 1.29.28. Ran test claim, Copay is $22.70. This test claim was processed through [facility]- copay amounts may vary at other pharmacies due to pharmacy/plan contracts, or as the patient moves through the different stages of their insurance plan.   PA #/Case ID/Reference #:  [id number]

## 2023-11-27 NOTE — Telephone Encounter (Signed)
Pharmacy Patient Advocate Encounter   Received notification from CoverMyMeds that prior authorization for Pantoprazole sodium 40 mg dr tablets is required/requested.   Insurance verification completed.   The patient is insured through [facility] .   Per test claim: PA required; PA started via CoverMyMeds. KEY [id number] . Waiting for clinical questions to populate.

## 2023-12-23 ENCOUNTER — Ambulatory Visit (HOSPITAL_BASED_OUTPATIENT_CLINIC_OR_DEPARTMENT_OTHER): Payer: No Typology Code available for payment source | Admitting: Orthopaedic Surgery

## 2023-12-23 ENCOUNTER — Ambulatory Visit: Payer: Self-pay | Admitting: Orthopaedic Surgery

## 2023-12-23 DIAGNOSIS — S73192A Other sprain of left hip, initial encounter: Secondary | ICD-10-CM | POA: Diagnosis not present

## 2023-12-23 MED ORDER — HYDROMORPHONE HCL 2 MG PO TABS: 2.0000 mg | ORAL_TABLET | ORAL | 0 refills | Status: DC | PRN

## 2023-12-23 NOTE — Progress Notes (Addendum)
 Post Operative Evaluation    Procedure/Date of Surgery: Left hip gluteus medius repair 07/30/22  Interval History:   Presents today for follow-up of her left hip.  Overall she is continuing to have C-shaped pain in the left hip and groin area.  This is painful and causing swelling to her.  She is here today for further discussion  PMH/PSH/Family History/Social History/Meds/Allergies:    Past Medical History:  Diagnosis Date   Diverticula of colon    Ganglion cyst    Gastritis    History of colonic polyps    Hypertension    on 03/08/15 pt denies high bp in over 10 yrs    IBS (irritable bowel syndrome)    Melanoma (HCC) 10/29/2004   seasonal allergies    Past Surgical History:  Procedure Laterality Date   ABDOMINAL HYSTERECTOMY  10/29/1997   CARPAL TUNNEL RELEASE Right    COLONOSCOPY  03/08/2015   FOOT SURGERY Bilateral    surg x 3 right, surg x 1 left   GLUTEUS MINIMUS REPAIR Left 07/30/2022   Procedure: LEFT GLUTEUS MEDIUS REPAIR;  Surgeon: Huel Cote, MD;  Location: [facility] OR;  Service: Orthopedics;  Laterality: Left;   KNEE ARTHROSCOPY Right 10/30/2011   MELANOMA EXCISION  10/29/2004   left shoulder   SPINAL FUSION     UPPER GASTROINTESTINAL ENDOSCOPY  03/08/2015   Dr.Pyrtle   WISDOM TOOTH EXTRACTION     Social History   Socioeconomic History   Marital status: Married    Spouse name: Not on file   Number of children: 2   Years of education: Not on file   Highest education level: Not on file  Occupational History   Occupation: Radio broadcast assistant: [employer]  Tobacco Use   Smoking status: Every Day    Current packs/day: 1.00    Average packs/day: 1 pack/day for 34.0 years (34.0 ttl pk-yrs)    Types: Cigarettes   Smokeless tobacco: Never   Tobacco comments:    Quit at age 59yr  Vaping Use   Vaping status: Never Used  Substance and Sexual Activity   Alcohol use: Not Currently    Comment: once a month per pt    Drug use: No   Sexual activity: Yes    Birth control/protection: Surgical    Comment: Hysterectomy  Other Topics Concern   Not on file  Social History Narrative   Not on file   Social Drivers of Health   Financial Resource Strain: Not on file  Food Insecurity: Not on file  Transportation Needs: Not on file  Physical Activity: Not on file  Stress: Not on file  Social Connections: Not on file   Family History  Problem Relation Age of Onset   Colon polyps Mother    Hypertension Mother    Leukemia Maternal Grandmother    Lymphoma Maternal Grandmother    Colon cancer Neg Hx    Esophageal cancer Neg Hx    Rectal cancer Neg Hx    Stomach cancer Neg Hx    Allergies  Allergen Reactions   Cetirizine Hcl Other (See Comments)    Not effective   Hydrocodone Itching    Have hypersensitivity to most pain meds   Other    Cephalexin Rash   Penicillins Rash   Sulfa Antibiotics  Rash   Current Outpatient Medications  Medication Sig Dispense Refill   Ascorbic Acid (VITAMIN C PO) Take 500 mg by mouth daily.     Cholecalciferol (VITAMIN D-3) 125 MCG (5000 UT) TABS Take by mouth.     dicyclomine (BENTYL) 20 MG tablet Take 1 tablet (20 mg total) by mouth daily as needed for spasms. 90 tablet 3   ELDERBERRY PO Take 50 mg by mouth daily. Gummy     famotidine (PEPCID) 20 MG tablet Take 1 tablet (20 mg total) by mouth at bedtime as needed for heartburn or indigestion. 90 tablet 3   fluticasone (FLONASE) 50 MCG/ACT nasal spray Place 1 spray into both nostrils daily as needed for allergies.     levocetirizine (XYZAL) 5 MG tablet Take 5 mg by mouth every evening.     losartan-hydrochlorothiazide (HYZAAR) 50-12.5 MG tablet Take 1 tablet by mouth daily.     pantoprazole (PROTONIX) 40 MG tablet Take 1 tablet (40 mg total) by mouth daily. 90 tablet 3   saccharomyces boulardii (FLORASTOR) 250 MG capsule Take 500 mg by mouth daily.     vitamin E 180 MG (400 UNITS) capsule Take 400 Units by mouth daily.      No current facility-administered medications for this visit.   No results found.  Review of Systems:   A ROS was performed including pertinent positives and negatives as documented in the HPI.   Musculoskeletal Exam:    There were no vitals taken for this visit.  Left hip is well-healed.  Sensation is intact in all distributions.  Positive FADIR maneuver with 30 degrees internal rotation with the hip in the 100 degrees of flexion.  External rotation is 45.  improved strength with abduction of the hip nearly equal to contralateral side.  She does still have a mild Trendelenburg gait.  There is tenderness about the ischial tuberosity with deep palpation as well as the abductor tubercle.  Distal neurosensory exam is intact with 2+ dorsalis pedis pulse  Imaging:    MRI left hip: There is a tear involving the anterior superior labrum left hip  I personally reviewed and interpreted the radiographs.   Assessment:   Status post left hip gluteus medius repair.  At today's visit she has having symptoms more consistent with labral pathology.  She does have a labral tear seen on MRI.  I did discuss that unfortunately she appears to have evidence of a traumatic labral tear with pathology of the joint.  She is having mechanical symptoms involving the joint with clicking and catching in the left hip.  She has had an injection as well as now multiple months of physical therapy.  Given that we did discuss the possibility of left hip arthroscopy with labral repair.  At this time she is ready for this.  I did discuss the risks and limitations.  I did discuss the specific rehab that would allow for immediate weightbearing.  After discussion she has elected for this Plan :    -Plan for left hip arthroscopic labral repair   After a lengthy discussion of treatment options, including risks, benefits, alternatives, complications of surgical and nonsurgical conservative options, the patient elected surgical  repair.   The patient  is aware of the material risks  and complications including, but not limited to injury to adjacent structures, neurovascular injury, infection, numbness, bleeding, implant failure, thermal burns, stiffness, persistent pain, failure to heal, disease transmission from allograft, need for further surgery, dislocation, anesthetic risks, blood clots, risks  of death,and others. The probabilities of surgical success and failure discussed with patient given their particular co-morbidities.The time and nature of expected rehabilitation and recovery was discussed.The patient's questions were all answered preoperatively.  No barriers to understanding were noted. I explained the natural history of the disease process and Rx rationale.  I explained to the patient what I considered to be reasonable expectations given their personal situation.  The final treatment plan was arrived at through a shared patient decision making process model.    I personally saw and evaluated the patient, and participated in the management and treatment plan.  Huel Cote, MD Attending Physician, Orthopedic Surgery  This document was dictated using Dragon voice recognition software. A reasonable attempt at proof reading has been made to minimize errors.

## 2023-12-27 ENCOUNTER — Other Ambulatory Visit (HOSPITAL_BASED_OUTPATIENT_CLINIC_OR_DEPARTMENT_OTHER): Payer: Self-pay | Admitting: Orthopaedic Surgery

## 2023-12-27 DIAGNOSIS — S73192A Other sprain of left hip, initial encounter: Secondary | ICD-10-CM

## 2024-01-13 ENCOUNTER — Encounter (HOSPITAL_BASED_OUTPATIENT_CLINIC_OR_DEPARTMENT_OTHER): Payer: Self-pay | Admitting: Orthopaedic Surgery

## 2024-02-17 ENCOUNTER — Telehealth (HOSPITAL_BASED_OUTPATIENT_CLINIC_OR_DEPARTMENT_OTHER): Payer: Self-pay | Admitting: Orthopaedic Surgery

## 2024-02-17 ENCOUNTER — Other Ambulatory Visit (HOSPITAL_BASED_OUTPATIENT_CLINIC_OR_DEPARTMENT_OTHER): Payer: Self-pay | Admitting: Orthopaedic Surgery

## 2024-02-17 DIAGNOSIS — S73192A Other sprain of left hip, initial encounter: Secondary | ICD-10-CM

## 2024-02-17 NOTE — Telephone Encounter (Signed)
 Surgery denial due to no PT

## 2024-03-12 ENCOUNTER — Other Ambulatory Visit (HOSPITAL_BASED_OUTPATIENT_CLINIC_OR_DEPARTMENT_OTHER): Payer: Self-pay | Admitting: Orthopaedic Surgery

## 2024-03-12 DIAGNOSIS — S73192A Other sprain of left hip, initial encounter: Secondary | ICD-10-CM

## 2024-03-17 ENCOUNTER — Ambulatory Visit (HOSPITAL_BASED_OUTPATIENT_CLINIC_OR_DEPARTMENT_OTHER): Admit: 2024-03-17 | Payer: No Typology Code available for payment source | Admitting: Orthopaedic Surgery

## 2024-03-17 ENCOUNTER — Encounter (HOSPITAL_BASED_OUTPATIENT_CLINIC_OR_DEPARTMENT_OTHER): Payer: Self-pay

## 2024-03-17 SURGERY — ARTHROSCOPY, HIP, WITH LABRUM REPAIR
Anesthesia: General | Laterality: Left

## 2024-03-27 ENCOUNTER — Ambulatory Visit (HOSPITAL_BASED_OUTPATIENT_CLINIC_OR_DEPARTMENT_OTHER): Attending: Orthopaedic Surgery | Admitting: Physical Therapy

## 2024-03-27 DIAGNOSIS — M6281 Muscle weakness (generalized): Secondary | ICD-10-CM | POA: Insufficient documentation

## 2024-03-27 DIAGNOSIS — M25552 Pain in left hip: Secondary | ICD-10-CM | POA: Diagnosis present

## 2024-03-27 DIAGNOSIS — S73192A Other sprain of left hip, initial encounter: Secondary | ICD-10-CM | POA: Insufficient documentation

## 2024-03-27 NOTE — Therapy (Unsigned)
 OUTPATIENT PHYSICAL THERAPY LOWER EXTREMITY EVALUATION   Patient Name: Rhonda Porter MRN: [id number] DOB:09/11/1971, 53 y.o., female Today's Date: 03/29/2024  END OF SESSION:  PT End of Session - 03/29/24 1336     Visit Number 1    Date for PT Re-Evaluation 05/01/24    PT Start Time 0720    PT Stop Time 0755    PT Time Calculation (min) 35 min    Activity Tolerance Patient tolerated treatment well    Behavior During Therapy [facility] for tasks assessed/performed             Past Medical History:  Diagnosis Date   Diverticula of colon    Ganglion cyst    Gastritis    History of colonic polyps    Hypertension    on 03/08/15 pt denies high bp in over 10 yrs    IBS (irritable bowel syndrome)    Melanoma (HCC) 10/29/2004   seasonal allergies    Past Surgical History:  Procedure Laterality Date   ABDOMINAL HYSTERECTOMY  10/29/1997   CARPAL TUNNEL RELEASE Right    COLONOSCOPY  03/08/2015   FOOT SURGERY Bilateral    surg x 3 right, surg x 1 left   GLUTEUS MINIMUS REPAIR Left 07/30/2022   Procedure: LEFT GLUTEUS MEDIUS REPAIR;  Surgeon: Wilhelmenia Harada, MD;  Location: [facility] OR;  Service: Orthopedics;  Laterality: Left;   KNEE ARTHROSCOPY Right 10/30/2011   MELANOMA EXCISION  10/29/2004   left shoulder   SPINAL FUSION     UPPER GASTROINTESTINAL ENDOSCOPY  03/08/2015   Dr.Pyrtle   WISDOM TOOTH EXTRACTION     Patient Active Problem List   Diagnosis Date Noted   Tear of left gluteus medius tendon    Lower abdominal pain 04/11/2017   Menopausal symptom 04/11/2017   Pain in pelvis 04/11/2017   LUQ abdominal pain 02/10/2015   Bloating 02/08/2015   Irritable bowel syndrome (IBS) 02/08/2015   Abdominal cramps 02/08/2015   DIVERTICULAR BLEEDING, HX OF 01/18/2009   HYPERTENSION 01/17/2009   History of colonic polyps 01/17/2009    PCP: Fredick Jarred PA-C  REFERRING PROVIDER:   Wilhelmenia Harada, MD    REFERRING DIAG: [phone] (ICD-10-CM) - Tear of left acetabular labrum, initial  encounter  THERAPY DIAG:  Pain in left hip  Muscle weakness (generalized)  Rationale for Evaluation and Treatment: Rehabilitation  ONSET DATE: 6 months  SUBJECTIVE:   SUBJECTIVE STATEMENT: My groin has been bothering me.  Had a left med repair and that is better but I need my labrum repaired as well.  PERTINENT HISTORY:  Status post left hip gluteus medius repair. T traumatic labral tear with pathology of the joint.  PAIN:  Are you having pain? Yes: NPRS scale: current 4/10; worst 5/10; least 2/10 Pain location: left hip and groin   Pain description: ache Aggravating factors: walking, sitting prolonged Relieving factors: rest  PRECAUTIONS: None   WEIGHT BEARING RESTRICTIONS No   FALLS:  Has patient fallen in last 6 months? No   LIVING ENVIRONMENT: Lives with: lives with their family and lives with their spouse Lives in: House/apartment Stairs: No, only stairs on back deck Has following equipment at home: None   OCCUPATION: WFH, sitting 8 hours; gets up every 45 mins    PLOF: Independent with basic ADLs   PATIENT GOALS : Improve strength; decrease pain  NEXT MD VISIT: not set  OBJECTIVE:  Note: Objective measures were completed at Evaluation unless otherwise noted.  DIAGNOSTIC FINDINGS: 7/23 MRI left hip  IMPRESSION: There is a tear involving the anterior superior labrum left hip   PATIENT SURVEYS:  LEFS 54/80   COGNITION: Overall cognitive status: Within functional limits for tasks assessed     SENSATION: WFL    LOWER EXTREMITY ROM:  Active ROM Right eval Left eval  Hip flexion    Hip extension    Hip abduction    Hip adduction    Hip internal rotation  30  Hip external rotation  45  Knee flexion    Knee extension    Ankle dorsiflexion    Ankle plantarflexion    Ankle inversion    Ankle eversion     (Blank rows = not tested)  LOWER EXTREMITY MMT:  MMT Right eval Left eval  Hip flexion  4 P!  Hip extension  4P!  Hip abduction   4P!  Hip adduction  5  Hip internal rotation    Hip external rotation    Knee flexion    Knee extension    Ankle dorsiflexion    Ankle plantarflexion    Ankle inversion    Ankle eversion     (Blank rows = not tested)  LOWER EXTREMITY SPECIAL TESTS:  Positive FADIR Left  FUNCTIONAL TESTS:  5 times sit to stand: 25.21 Timed up and go (TUG): 13.42 4 stage balance: Passed 1&2.  Tandem x 9s; SLS x 3s  GAIT: Distance walked: unlimited Assistive device utilized: None Level of assistance: Complete Independence Comments: Off loading right, slight trendelenburg left                                                                                                                                 TREATMENT Eval Self care:Posture and body Curator instruction. Use of AD; sleeping positioning    PATIENT EDUCATION:  Education details: Discussed eval findings, rehab rationale, aquatic program progression/POC and pools in area. Patient is in agreement  Person educated: Patient Education method: Explanation Education comprehension: verbalized understanding  HOME EXERCISE PROGRAM: TBA  ASSESSMENT:  CLINICAL IMPRESSION: Patient is a 53 y.o. f who was seen today for physical therapy evaluation and treatment for Tear of left acetabular labrum. Pt presents for trial of aquatic physical therapy x 6 weeks to improve left hip pain and mobility.  She is planning on surgical repair but is required by insurance to trial therapy for potential improvement to determine necessity of surgical procedure.  She has had a recent repair of left glute med tendon with good results. Exam demonstrates weakness in left glutes and hip flexors.  She has a slight trendelenburg on left with gait.  Pain is constant and moderately sensitive.  She will benefit from skilled physical therapy intervention to improve strength , gait and manage pain improving her toleration to functional mobility and ADL's.  OBJECTIVE  IMPAIRMENTS: Abnormal gait, difficulty walking, and decreased strength.   ACTIVITY LIMITATIONS: carrying, lifting, sitting, squatting, stairs, and transfers  PARTICIPATION LIMITATIONS: cleaning,  shopping, community activity, and occupation  PERSONAL FACTORS: 1 comorbidity: recent glute med tendon repair are also affecting patient's functional outcome.   REHAB POTENTIAL: Good  CLINICAL DECISION MAKING: Stable/uncomplicated  EVALUATION COMPLEXITY: Low   GOALS: Goals reviewed with patient? Yes  SHORT TERM GOALS: Target date: 05/01/24 Pt will tolerate full aquatic sessions consistently without increase in pain and with improving function to demonstrate good toleration and effectiveness of intervention.  Baseline: Goal status: INITIAL  2.  Pt will report decrease in pain by at least 50% for improved toleration to activity/quality of life and to demonstrate improved management of pain. Baseline:  Goal status: INITIAL  3.  Pt will improve on 5 X STS test to <or=20s to demonstrate improving functional lower extremity strength, transitional movements, and balance Baseline: 25.21 Goal status: INITIAL  4.  Pt will perform tandem stance and SLS x10s or > to demonstrate improvement in balance Baseline: see chart Goal status: INITIAL  5.  Pt to amb without Trendelenburg demonstrating improvement in right glute strength Baseline: slight Trendelenburg left Goal status: INITIAL   LONG TERM GOALS: to be set at re-cert as approp    PLAN:  PT FREQUENCY: 1-2x weekly  PT DURATION: 6 weeks  PLANNED INTERVENTIONS: 97164- PT Re-evaluation, 97110-Therapeutic exercises, 97530- Therapeutic activity, 97112- Neuromuscular re-education, 97535- Self Care, 95638- Manual therapy, [id number]- Gait training, [phone]- Aquatic Therapy, [phone]- Ionotophoresis 4mg /ml Dexamethasone , Patient/Family education, Balance training, Stair training, Taping, Dry Needling, Joint mobilization, DME instructions, Cryotherapy, and  Moist heat  PLAN FOR NEXT SESSION: Aquatics: left glute and hip flex strengthening, balance retraining, pain management, gait training

## 2024-03-29 ENCOUNTER — Other Ambulatory Visit: Payer: Self-pay

## 2024-03-29 ENCOUNTER — Encounter (HOSPITAL_BASED_OUTPATIENT_CLINIC_OR_DEPARTMENT_OTHER): Payer: Self-pay | Admitting: Physical Therapy

## 2024-03-30 ENCOUNTER — Encounter (HOSPITAL_BASED_OUTPATIENT_CLINIC_OR_DEPARTMENT_OTHER): Payer: No Typology Code available for payment source | Admitting: Orthopaedic Surgery

## 2024-04-01 ENCOUNTER — Ambulatory Visit (HOSPITAL_BASED_OUTPATIENT_CLINIC_OR_DEPARTMENT_OTHER): Attending: Orthopaedic Surgery | Admitting: Physical Therapy

## 2024-04-01 ENCOUNTER — Encounter (HOSPITAL_BASED_OUTPATIENT_CLINIC_OR_DEPARTMENT_OTHER): Admitting: Orthopaedic Surgery

## 2024-04-01 ENCOUNTER — Encounter (HOSPITAL_BASED_OUTPATIENT_CLINIC_OR_DEPARTMENT_OTHER): Payer: Self-pay | Admitting: Physical Therapy

## 2024-04-01 DIAGNOSIS — R262 Difficulty in walking, not elsewhere classified: Secondary | ICD-10-CM | POA: Diagnosis present

## 2024-04-01 DIAGNOSIS — M25552 Pain in left hip: Secondary | ICD-10-CM

## 2024-04-01 DIAGNOSIS — R600 Localized edema: Secondary | ICD-10-CM | POA: Insufficient documentation

## 2024-04-01 DIAGNOSIS — M6281 Muscle weakness (generalized): Secondary | ICD-10-CM

## 2024-04-01 NOTE — Therapy (Signed)
 OUTPATIENT PHYSICAL THERAPY LOWER EXTREMITY EVALUATION   Patient Name: Rhonda Porter MRN: [id number] DOB:[birth year], 53 y.o., female Today's Date: 04/01/2024  END OF SESSION:  PT End of Session - 04/01/24 0817     Visit Number 2    Date for PT Re-Evaluation 05/01/24    PT Start Time 0802    PT Stop Time 0843    PT Time Calculation (min) 41 min    Activity Tolerance Patient tolerated treatment well    Behavior During Therapy [facility] for tasks assessed/performed             Past Medical History:  Diagnosis Date   Diverticula of colon    Ganglion cyst    Gastritis    History of colonic polyps    Hypertension    on 03/08/15 pt denies high bp in over 10 yrs    IBS (irritable bowel syndrome)    Melanoma (HCC) 10/29/2004   seasonal allergies    Past Surgical History:  Procedure Laterality Date   ABDOMINAL HYSTERECTOMY  10/29/1997   CARPAL TUNNEL RELEASE Right    COLONOSCOPY  03/08/2015   FOOT SURGERY Bilateral    surg x 3 right, surg x 1 left   GLUTEUS MINIMUS REPAIR Left 07/30/2022   Procedure: LEFT GLUTEUS MEDIUS REPAIR;  Surgeon: Wilhelmenia Harada, MD;  Location: [facility] OR;  Service: Orthopedics;  Laterality: Left;   KNEE ARTHROSCOPY Right 10/30/2011   MELANOMA EXCISION  10/29/2004   left shoulder   SPINAL FUSION     UPPER GASTROINTESTINAL ENDOSCOPY  03/08/2015   Dr.Pyrtle   WISDOM TOOTH EXTRACTION     Patient Active Problem List   Diagnosis Date Noted   Tear of left gluteus medius tendon    Lower abdominal pain 04/11/2017   Menopausal symptom 04/11/2017   Pain in pelvis 04/11/2017   LUQ abdominal pain 02/10/2015   Bloating 02/08/2015   Irritable bowel syndrome (IBS) 02/08/2015   Abdominal cramps 02/08/2015   DIVERTICULAR BLEEDING, HX OF 01/18/2009   HYPERTENSION 01/17/2009   History of colonic polyps 01/17/2009    PCP: Fredick Jarred PA-C  REFERRING PROVIDER:   Wilhelmenia Harada, MD    REFERRING DIAG: [phone] (ICD-10-CM) - Tear of left acetabular labrum, initial  encounter  THERAPY DIAG:  Pain in left hip  Muscle weakness (generalized)  Difficulty walking  Rationale for Evaluation and Treatment: Rehabilitation  ONSET DATE: 6 months  SUBJECTIVE:   SUBJECTIVE STATEMENT: My left thigh is swollen again this morning  PERTINENT HISTORY:  Status post left hip gluteus medius repair. T traumatic labral tear with pathology of the joint.  PAIN:  Are you having pain? Yes: NPRS scale: current 2/10; worst 5/10; least 2/10 Pain location: left hip and groin   Pain description: ache Aggravating factors: walking, sitting prolonged Relieving factors: rest  PRECAUTIONS: None   WEIGHT BEARING RESTRICTIONS No   FALLS:  Has patient fallen in last 6 months? No   LIVING ENVIRONMENT: Lives with: lives with their family and lives with their spouse Lives in: House/apartment Stairs: No, only stairs on back deck Has following equipment at home: None   OCCUPATION: WFH, sitting 8 hours; gets up every 45 mins    PLOF: Independent with basic ADLs   PATIENT GOALS : Improve strength; decrease pain  NEXT MD VISIT: not set  OBJECTIVE:  Note: Objective measures were completed at Evaluation unless otherwise noted.  DIAGNOSTIC FINDINGS: 7/23 MRI left hip IMPRESSION: There is a tear involving the anterior superior labrum left hip  PATIENT SURVEYS:  LEFS 54/80   COGNITION: Overall cognitive status: Within functional limits for tasks assessed     SENSATION: WFL    LOWER EXTREMITY ROM:  Active ROM Right eval Left eval  Hip flexion    Hip extension    Hip abduction    Hip adduction    Hip internal rotation  30  Hip external rotation  45  Knee flexion    Knee extension    Ankle dorsiflexion    Ankle plantarflexion    Ankle inversion    Ankle eversion     (Blank rows = not tested)  LOWER EXTREMITY MMT:  MMT Right eval Left eval  Hip flexion  4 P!  Hip extension  4P!  Hip abduction  4P!  Hip adduction  5  Hip internal rotation     Hip external rotation    Knee flexion    Knee extension    Ankle dorsiflexion    Ankle plantarflexion    Ankle inversion    Ankle eversion     (Blank rows = not tested)  LOWER EXTREMITY SPECIAL TESTS:  Positive FADIR Left  FUNCTIONAL TESTS:  5 times sit to stand: 25.21 Timed up and go (TUG): 13.42 4 stage balance: Passed 1&2.  Tandem x 9s; SLS x 3s  GAIT: Distance walked: unlimited Assistive device utilized: None Level of assistance: Complete Independence Comments: Off loading right, slight trendelenburg left                                                                                                                                 TREATMENT [facility] Adult PT Treatment:                                                DATE: 04/01/24 Pt seen for aquatic therapy today.  Treatment took place in water 3.5-4.75 ft in depth at the [facility] pool. Temp of water was 91.  Pt entered/exited the pool via stairs and step to pattern with hand rail.  *Intro to setting *walking forward, back and side stepping in 3.6 ft unsupported *quad stretch using noodle then at steps Tandem and SLS for balance and proprioceptive retraining ue support yellow hand buoys 3.57ft *Yellow HB carry bilaterally then unilaterally forward and back *Side stepping ue add/abd yellow HB *adductor stretch ue support wall *cycling on noodle *walking forward and back between exercises for recovery  Pt requires the buoyancy and hydrostatic pressure of water for support, and to offload joints by unweighting joint load by at least 50 % in navel deep water and by at least 75-80% in chest to neck deep water.  Viscosity of the water is needed for resistance of strengthening. Water current perturbations provides challenge to standing balance requiring increased core activation.  PATIENT EDUCATION:  Education details: Discussed eval findings, rehab rationale, aquatic program progression/POC and pools in area.  Patient is in agreement  Person educated: Patient Education method: Explanation Education comprehension: verbalized understanding  HOME EXERCISE PROGRAM: Access Code: [email or web address] URL: [email or web address] Date: 04/01/2024 Prepared by: Aretta Kudo  Exercises - Modified Thomas Stretch  - 1 x daily - 7 x weekly - 3 sets - 10 reps - Hip Flexor Stretch at Edge of Bed  - 1 x daily - 7 x weekly - 3 sets - 10 reps - Thomas Stretch on Table  - 1 x daily - 7 x weekly - 3 sets - 10 reps  ASSESSMENT:  CLINICAL IMPRESSION: Pt demonstrates safety and independence in aquatic setting with therapist instructing from deck. She is confident in setting, moving throughout all depths easily.  Pt is directed through various movement patterns and trials in both sitting and standing positions. Focused on left hip flex stretching balance and proprioceptive retraining with good toleration.Of note: left hip flex and adductor TTP moderate tightness. Pt instructed on stretches and given hand out.  Goals are ongoing.     Initial Impression Patient is a 53 y.o. f who was seen today for physical therapy evaluation and treatment for Tear of left acetabular labrum. Pt presents for trial of aquatic physical therapy x 6 weeks to improve left hip pain and mobility.  She is planning on surgical repair but is required by insurance to trial therapy for potential improvement to determine necessity of surgical procedure.  She has had a recent repair of left glute med tendon with good results. Exam demonstrates weakness in left glutes and hip flexors.  She has a slight trendelenburg on left with gait.  Pain is constant and moderately sensitive.  She will benefit from skilled physical therapy intervention to improve strength , gait and manage pain improving her toleration to functional mobility and ADL's.  OBJECTIVE IMPAIRMENTS: Abnormal gait, difficulty walking, and decreased strength.   ACTIVITY LIMITATIONS:  carrying, lifting, sitting, squatting, stairs, and transfers  PARTICIPATION LIMITATIONS: cleaning, shopping, community activity, and occupation  PERSONAL FACTORS: 1 comorbidity: recent glute med tendon repair are also affecting patient's functional outcome.   REHAB POTENTIAL: Good  CLINICAL DECISION MAKING: Stable/uncomplicated  EVALUATION COMPLEXITY: Low   GOALS: Goals reviewed with patient? Yes  SHORT TERM GOALS: Target date: 05/01/24 Pt will tolerate full aquatic sessions consistently without increase in pain and with improving function to demonstrate good toleration and effectiveness of intervention.  Baseline: Goal status: INITIAL  2.  Pt will report decrease in pain by at least 50% for improved toleration to activity/quality of life and to demonstrate improved management of pain. Baseline:  Goal status: INITIAL  3.  Pt will improve on 5 X STS test to <or=20s to demonstrate improving functional lower extremity strength, transitional movements, and balance Baseline: 25.21 Goal status: INITIAL  4.  Pt will perform tandem stance and SLS x10s or > to demonstrate improvement in balance Baseline: see chart Goal status: INITIAL  5.  Pt to amb without Trendelenburg demonstrating improvement in right glute strength Baseline: slight Trendelenburg left Goal status: INITIAL   LONG TERM GOALS: to be set at re-cert as approp    PLAN:  PT FREQUENCY: 1-2x weekly  PT DURATION: 6 weeks  PLANNED INTERVENTIONS: 97164- PT Re-evaluation, 97110-Therapeutic exercises, 97530- Therapeutic activity, 97112- Neuromuscular re-education, 97535- Self Care, 09811- Manual therapy, [id number]- Gait training, [phone]- Aquatic Therapy, [phone]- Ionotophoresis 4mg /ml Dexamethasone , Patient/Family education, Balance training, Stair training, Taping, Dry  Needling, Joint mobilization, DME instructions, Cryotherapy, and Moist heat  PLAN FOR NEXT SESSION: Aquatics: left glute and hip flex strengthening, balance  retraining, pain management, gait training

## 2024-04-10 ENCOUNTER — Encounter (HOSPITAL_BASED_OUTPATIENT_CLINIC_OR_DEPARTMENT_OTHER): Payer: Self-pay | Admitting: Physical Therapy

## 2024-04-10 ENCOUNTER — Ambulatory Visit (HOSPITAL_BASED_OUTPATIENT_CLINIC_OR_DEPARTMENT_OTHER): Payer: Self-pay | Admitting: Physical Therapy

## 2024-04-10 DIAGNOSIS — M6281 Muscle weakness (generalized): Secondary | ICD-10-CM

## 2024-04-10 DIAGNOSIS — R262 Difficulty in walking, not elsewhere classified: Secondary | ICD-10-CM

## 2024-04-10 DIAGNOSIS — M25552 Pain in left hip: Secondary | ICD-10-CM

## 2024-04-10 NOTE — Therapy (Signed)
 OUTPATIENT PHYSICAL THERAPY LOWER EXTREMITY EVALUATION   Patient Name: Rhonda Porter MRN: [id number] DOB:1971-08-15, 53 y.o., female Today's Date: 04/10/2024  END OF SESSION:  PT End of Session - 04/10/24 0855     Visit Number 3    Date for PT Re-Evaluation 05/01/24    PT Start Time 0850    PT Stop Time 0930    PT Time Calculation (min) 40 min    Activity Tolerance Patient tolerated treatment well    Behavior During Therapy [facility] for tasks assessed/performed          Past Medical History:  Diagnosis Date   Diverticula of colon    Ganglion cyst    Gastritis    History of colonic polyps    Hypertension    on 03/08/15 pt denies high bp in over 10 yrs    IBS (irritable bowel syndrome)    Melanoma (HCC) 10/29/2004   seasonal allergies    Past Surgical History:  Procedure Laterality Date   ABDOMINAL HYSTERECTOMY  10/29/1997   CARPAL TUNNEL RELEASE Right    COLONOSCOPY  03/08/2015   FOOT SURGERY Bilateral    surg x 3 right, surg x 1 left   GLUTEUS MINIMUS REPAIR Left 07/30/2022   Procedure: LEFT GLUTEUS MEDIUS REPAIR;  Surgeon: Wilhelmenia Harada, MD;  Location: [facility] OR;  Service: Orthopedics;  Laterality: Left;   KNEE ARTHROSCOPY Right 10/30/2011   MELANOMA EXCISION  10/29/2004   left shoulder   SPINAL FUSION     UPPER GASTROINTESTINAL ENDOSCOPY  03/08/2015   Dr.Pyrtle   WISDOM TOOTH EXTRACTION     Patient Active Problem List   Diagnosis Date Noted   Tear of left gluteus medius tendon    Lower abdominal pain 04/11/2017   Menopausal symptom 04/11/2017   Pain in pelvis 04/11/2017   LUQ abdominal pain 02/10/2015   Bloating 02/08/2015   Irritable bowel syndrome (IBS) 02/08/2015   Abdominal cramps 02/08/2015   DIVERTICULAR BLEEDING, HX OF 01/18/2009   HYPERTENSION 01/17/2009   History of colonic polyps 01/17/2009    PCP: Fredick Jarred PA-C  REFERRING PROVIDER:   Wilhelmenia Harada, MD    REFERRING DIAG: [phone] (ICD-10-CM) - Tear of left acetabular labrum, initial  encounter  THERAPY DIAG:  Pain in left hip  Muscle weakness (generalized)  Difficulty walking  Rationale for Evaluation and Treatment: Rehabilitation  ONSET DATE: 6 months  SUBJECTIVE:   SUBJECTIVE STATEMENT: Hurt for 2 days after last session  PERTINENT HISTORY:  Status post left hip gluteus medius repair. T traumatic labral tear with pathology of the joint.  PAIN:  Are you having pain? Yes: NPRS scale: current 4/10; worst 5/10; least 2/10 Pain location: left hip and groin   Pain description: ache Aggravating factors: walking, sitting prolonged Relieving factors: rest  PRECAUTIONS: None   WEIGHT BEARING RESTRICTIONS No   FALLS:  Has patient fallen in last 6 months? No   LIVING ENVIRONMENT: Lives with: lives with their family and lives with their spouse Lives in: House/apartment Stairs: No, only stairs on back deck Has following equipment at home: None   OCCUPATION: WFH, sitting 8 hours; gets up every 45 mins    PLOF: Independent with basic ADLs   PATIENT GOALS : Improve strength; decrease pain  NEXT MD VISIT: not set  OBJECTIVE:  Note: Objective measures were completed at Evaluation unless otherwise noted.  DIAGNOSTIC FINDINGS: 7/23 MRI left hip IMPRESSION: There is a tear involving the anterior superior labrum left hip   PATIENT SURVEYS:  LEFS 54/80   COGNITION: Overall cognitive status: Within functional limits for tasks assessed     SENSATION: WFL    LOWER EXTREMITY ROM:  Active ROM Right eval Left eval  Hip flexion    Hip extension    Hip abduction    Hip adduction    Hip internal rotation  30  Hip external rotation  45  Knee flexion    Knee extension    Ankle dorsiflexion    Ankle plantarflexion    Ankle inversion    Ankle eversion     (Blank rows = not tested)  LOWER EXTREMITY MMT:  MMT Right eval Left eval  Hip flexion  4 P!  Hip extension  4P!  Hip abduction  4P!  Hip adduction  5  Hip internal rotation    Hip  external rotation    Knee flexion    Knee extension    Ankle dorsiflexion    Ankle plantarflexion    Ankle inversion    Ankle eversion     (Blank rows = not tested)  LOWER EXTREMITY SPECIAL TESTS:  Positive FADIR Left  FUNCTIONAL TESTS:  5 times sit to stand: 25.21 Timed up and go (TUG): 13.42 4 stage balance: Passed 1&2.  Tandem x 9s; SLS x 3s  GAIT: Distance walked: unlimited Assistive device utilized: None Level of assistance: Complete Independence Comments: Off loading right, slight trendelenburg left                                                                                                                                 TREATMENT [facility] Adult PT Treatment:                                                DATE: 04/10/24 Pt seen for aquatic therapy today.  Treatment took place in water 3.5-4.75 ft in depth at the [facility] pool. Temp of water was 91.  Pt entered/exited the pool via stairs and step to pattern with hand rail.   *walking forward, back and side stepping in 3.6 ft unsupported *STS from 3rd step 2 x 5 *standing ue support wall 3.6 ft: toe raises, heel raises; hip add/abd; relaxed squats x10-12 *Yellow HB carry bilaterally then unilaterally forward and back *quad stretch at steps *Tandem and SLS for balance and proprioceptive retraining ue support yellow hand buoys 3.65ft *walking forward and back between exercises for recovery  Pt requires the buoyancy and hydrostatic pressure of water for support, and to offload joints by unweighting joint load by at least 50 % in navel deep water and by at least 75-80% in chest to neck deep water.  Viscosity of the water is needed for resistance of strengthening. Water current perturbations provides challenge to standing balance requiring increased core activation.  PATIENT EDUCATION:  Education details: Discussed eval findings, rehab rationale, aquatic program progression/POC and pools in area. Patient is  in agreement  Person educated: Patient Education method: Explanation Education comprehension: verbalized understanding  HOME EXERCISE PROGRAM: Access Code: [email or web address] URL: [email or web address] Date: 04/01/2024 Prepared by: Aretta Kudo  Exercises - Modified Thomas Stretch  - 1 x daily - 7 x weekly - 3 sets - 10 reps - Hip Flexor Stretch at Edge of Bed  - 1 x daily - 7 x weekly - 3 sets - 10 reps - Thomas Stretch on Table  - 1 x daily - 7 x weekly - 3 sets - 10 reps  ASSESSMENT:  CLINICAL IMPRESSION: Pt reports 2 days of soreness after last treatment. Increased left groin pain today which reduces with aquatic intervention although continues throughout session. Pt concerned about her copay.  She is scheduled out nicely for 6 visits over 6 weeks.  She reports no reduction in pain as of yet with aquatic therapy intervention  t demonstrates safety and independence in aquatic setting with therapist instructing from deck. She is confident in setting, moving throughout all depths easily.  Pt is directed through various movement patterns and trials in both sitting and standing positions. Focused on left hip flex stretching balance and proprioceptive retraining with good toleration.Of note: left hip flex and adductor TTP moderate tightness. Pt instructed on stretches and given hand out.  Goals are ongoing.     Initial Impression Patient is a 54 y.o. f who was seen today for physical therapy evaluation and treatment for Tear of left acetabular labrum. Pt presents for trial of aquatic physical therapy x 6 weeks to improve left hip pain and mobility.  She is planning on surgical repair but is required by insurance to trial therapy for potential improvement to determine necessity of surgical procedure.  She has had a recent repair of left glute med tendon with good results. Exam demonstrates weakness in left glutes and hip flexors.  She has a slight trendelenburg on left with gait.  Pain  is constant and moderately sensitive.  She will benefit from skilled physical therapy intervention to improve strength , gait and manage pain improving her toleration to functional mobility and ADL's.  OBJECTIVE IMPAIRMENTS: Abnormal gait, difficulty walking, and decreased strength.   ACTIVITY LIMITATIONS: carrying, lifting, sitting, squatting, stairs, and transfers  PARTICIPATION LIMITATIONS: cleaning, shopping, community activity, and occupation  PERSONAL FACTORS: 1 comorbidity: recent glute med tendon repair are also affecting patient's functional outcome.   REHAB POTENTIAL: Good  CLINICAL DECISION MAKING: Stable/uncomplicated  EVALUATION COMPLEXITY: Low   GOALS: Goals reviewed with patient? Yes  SHORT TERM GOALS: Target date: 05/01/24 Pt will tolerate full aquatic sessions consistently without increase in pain and with improving function to demonstrate good toleration and effectiveness of intervention.  Baseline: Goal status: INITIAL  2.  Pt will report decrease in pain by at least 50% for improved toleration to activity/quality of life and to demonstrate improved management of pain. Baseline:  Goal status: INITIAL  3.  Pt will improve on 5 X STS test to <or=20s to demonstrate improving functional lower extremity strength, transitional movements, and balance Baseline: 25.21 Goal status: INITIAL  4.  Pt will perform tandem stance and SLS x10s or > to demonstrate improvement in balance Baseline: see chart Goal status: INITIAL  5.  Pt to amb without Trendelenburg demonstrating improvement in right glute strength Baseline: slight Trendelenburg left Goal status: INITIAL   LONG TERM GOALS: to be set at  re-cert as approp    PLAN:  PT FREQUENCY: 1-2x weekly  PT DURATION: 6 weeks  PLANNED INTERVENTIONS: 97164- PT Re-evaluation, 97110-Therapeutic exercises, 97530- Therapeutic activity, [id number]- Neuromuscular re-education, 97535- Self Care, [id number]- Manual therapy, [phone]- Gait  training, [phone]- Aquatic Therapy, [phone]- Ionotophoresis 4mg /ml Dexamethasone , Patient/Family education, Balance training, Stair training, Taping, Dry Needling, Joint mobilization, DME instructions, Cryotherapy, and Moist heat  PLAN FOR NEXT SESSION: Aquatics: left glute and hip flex strengthening, balance retraining, pain management, gait training

## 2024-04-14 ENCOUNTER — Encounter (HOSPITAL_BASED_OUTPATIENT_CLINIC_OR_DEPARTMENT_OTHER): Payer: Self-pay | Admitting: Physical Therapy

## 2024-04-14 ENCOUNTER — Ambulatory Visit (HOSPITAL_BASED_OUTPATIENT_CLINIC_OR_DEPARTMENT_OTHER): Payer: Self-pay | Admitting: Physical Therapy

## 2024-04-14 DIAGNOSIS — M6281 Muscle weakness (generalized): Secondary | ICD-10-CM

## 2024-04-14 DIAGNOSIS — M25552 Pain in left hip: Secondary | ICD-10-CM | POA: Diagnosis not present

## 2024-04-14 DIAGNOSIS — R262 Difficulty in walking, not elsewhere classified: Secondary | ICD-10-CM

## 2024-04-14 NOTE — Therapy (Signed)
 OUTPATIENT PHYSICAL THERAPY LOWER EXTREMITY Treatment    Patient Name: Rhonda Porter MRN: [id number] DOB:1970/11/11, 53 y.o., female Today's Date: 04/14/2024  END OF SESSION:  PT End of Session - 04/14/24 0847     Visit Number 4    Date for PT Re-Evaluation 05/01/24    PT Start Time 0848    PT Stop Time 0927    PT Time Calculation (min) 39 min    Activity Tolerance Patient tolerated treatment well    Behavior During Therapy [facility] for tasks assessed/performed          Past Medical History:  Diagnosis Date   Diverticula of colon    Ganglion cyst    Gastritis    History of colonic polyps    Hypertension    on 03/08/15 pt denies high bp in over 10 yrs    IBS (irritable bowel syndrome)    Melanoma (HCC) 10/29/2004   seasonal allergies    Past Surgical History:  Procedure Laterality Date   ABDOMINAL HYSTERECTOMY  10/29/1997   CARPAL TUNNEL RELEASE Right    COLONOSCOPY  03/08/2015   FOOT SURGERY Bilateral    surg x 3 right, surg x 1 left   GLUTEUS MINIMUS REPAIR Left 07/30/2022   Procedure: LEFT GLUTEUS MEDIUS REPAIR;  Surgeon: Wilhelmenia Harada, MD;  Location: [facility] OR;  Service: Orthopedics;  Laterality: Left;   KNEE ARTHROSCOPY Right 10/30/2011   MELANOMA EXCISION  10/29/2004   left shoulder   SPINAL FUSION     UPPER GASTROINTESTINAL ENDOSCOPY  03/08/2015   Dr.Pyrtle   WISDOM TOOTH EXTRACTION     Patient Active Problem List   Diagnosis Date Noted   Tear of left gluteus medius tendon    Lower abdominal pain 04/11/2017   Menopausal symptom 04/11/2017   Pain in pelvis 04/11/2017   LUQ abdominal pain 02/10/2015   Bloating 02/08/2015   Irritable bowel syndrome (IBS) 02/08/2015   Abdominal cramps 02/08/2015   DIVERTICULAR BLEEDING, HX OF 01/18/2009   HYPERTENSION 01/17/2009   History of colonic polyps 01/17/2009    PCP: Fredick Jarred PA-C  REFERRING PROVIDER:   Wilhelmenia Harada, MD    REFERRING DIAG: [phone] (ICD-10-CM) - Tear of left acetabular labrum, initial  encounter  THERAPY DIAG:  Pain in left hip  Muscle weakness (generalized)  Difficulty walking  Rationale for Evaluation and Treatment: Rehabilitation  ONSET DATE: 6 months  SUBJECTIVE:   SUBJECTIVE STATEMENT: Hurt for 2 days after last session  PERTINENT HISTORY:  Status post left hip gluteus medius repair. T traumatic labral tear with pathology of the joint.  PAIN:  Are you having pain? Yes: NPRS scale: current 4/10; worst 5/10; least 2/10 Pain location: left hip and groin   Pain description: ache Aggravating factors: walking, sitting prolonged Relieving factors: rest  PRECAUTIONS: None   WEIGHT BEARING RESTRICTIONS No   FALLS:  Has patient fallen in last 6 months? No   LIVING ENVIRONMENT: Lives with: lives with their family and lives with their spouse Lives in: House/apartment Stairs: No, only stairs on back deck Has following equipment at home: None   OCCUPATION: WFH, sitting 8 hours; gets up every 45 mins    PLOF: Independent with basic ADLs   PATIENT GOALS : Improve strength; decrease pain  NEXT MD VISIT: not set  OBJECTIVE:  Note: Objective measures were completed at Evaluation unless otherwise noted.  DIAGNOSTIC FINDINGS: 7/23 MRI left hip IMPRESSION: There is a tear involving the anterior superior labrum left hip   PATIENT SURVEYS:  LEFS 54/80   COGNITION: Overall cognitive status: Within functional limits for tasks assessed     SENSATION: WFL    LOWER EXTREMITY ROM:  Active ROM Right eval Left eval  Hip flexion    Hip extension    Hip abduction    Hip adduction    Hip internal rotation  30  Hip external rotation  45  Knee flexion    Knee extension    Ankle dorsiflexion    Ankle plantarflexion    Ankle inversion    Ankle eversion     (Blank rows = not tested)  LOWER EXTREMITY MMT:  MMT Right eval Left eval  Hip flexion  4 P!  Hip extension  4P!  Hip abduction  4P!  Hip adduction  5  Hip internal rotation    Hip  external rotation    Knee flexion    Knee extension    Ankle dorsiflexion    Ankle plantarflexion    Ankle inversion    Ankle eversion     (Blank rows = not tested)  LOWER EXTREMITY SPECIAL TESTS:  Positive FADIR Left  FUNCTIONAL TESTS:  5 times sit to stand: 25.21 Timed up and go (TUG): 13.42 4 stage balance: Passed 1&2.  Tandem x 9s; SLS x 3s  GAIT: Distance walked: unlimited Assistive device utilized: None Level of assistance: Complete Independence Comments: Off loading right, slight trendelenburg left                                                                                                                                 TREATMENT [facility] Adult PT Treatment:                                                DATE: 04/14/24 Pt seen for aquatic therapy today.  Treatment took place in water 3.5-4.75 ft in depth at the [facility] pool. Temp of water was 91.  Pt entered/exited the pool via stairs and step to pattern with hand rail.   *walking forward, back and side stepping in 3.6 ft unsupported *Tandem and SLS for balance and proprioceptive retraining ue support RB hand buoys 3.69ft->ue add/abd *tandem walking forward and back x 4 widths ue support RBHB *Yellow HB carry bilaterally then unilaterally forward and back *walking forward and back between exercises for recovery  Pt requires the buoyancy and hydrostatic pressure of water for support, and to offload joints by unweighting joint load by at least 50 % in navel deep water and by at least 75-80% in chest to neck deep water.  Viscosity of the water is needed for resistance of strengthening. Water current perturbations provides challenge to standing balance requiring increased core activation.       PATIENT EDUCATION:  Education details: Discussed eval findings, rehab rationale, aquatic program progression/POC  and pools in area. Patient is in agreement  Person educated: Patient Education method:  Explanation Education comprehension: verbalized understanding  HOME EXERCISE PROGRAM: Access Code: [email or web address] URL: [email or web address] Date: 04/01/2024 Prepared by: Aretta Kudo  Exercises - Modified Thomas Stretch  - 1 x daily - 7 x weekly - 3 sets - 10 reps - Hip Flexor Stretch at Edge of Bed  - 1 x daily - 7 x weekly - 3 sets - 10 reps - Thomas Stretch on Table  - 1 x daily - 7 x weekly - 3 sets - 10 reps  ASSESSMENT:  CLINICAL IMPRESSION: Pt reports increase in left anterior thigh and groin pain due to persistent edema that gets heavy as day progresses. She is encouraged to consider using a thigh sleeve for compression to see if it assists with the discomfort.  She vu.  She reports no reduction in pain since onset of therapy states her thigh pain and edema seem to be getting worse.  Focused on left hip ROM and balance today.  Requires  a few recovery periods.  Cues for engaging lle rather than off loading right with exercise     Initial Impression Patient is a 53 y.o. f who was seen today for physical therapy evaluation and treatment for Tear of left acetabular labrum. Pt presents for trial of aquatic physical therapy x 6 weeks to improve left hip pain and mobility.  She is planning on surgical repair but is required by insurance to trial therapy for potential improvement to determine necessity of surgical procedure.  She has had a recent repair of left glute med tendon with good results. Exam demonstrates weakness in left glutes and hip flexors.  She has a slight trendelenburg on left with gait.  Pain is constant and moderately sensitive.  She will benefit from skilled physical therapy intervention to improve strength , gait and manage pain improving her toleration to functional mobility and ADL's.  OBJECTIVE IMPAIRMENTS: Abnormal gait, difficulty walking, and decreased strength.   ACTIVITY LIMITATIONS: carrying, lifting, sitting, squatting, stairs, and  transfers  PARTICIPATION LIMITATIONS: cleaning, shopping, community activity, and occupation  PERSONAL FACTORS: 1 comorbidity: recent glute med tendon repair are also affecting patient's functional outcome.   REHAB POTENTIAL: Good  CLINICAL DECISION MAKING: Stable/uncomplicated  EVALUATION COMPLEXITY: Low   GOALS: Goals reviewed with patient? Yes  SHORT TERM GOALS: Target date: 05/01/24 Pt will tolerate full aquatic sessions consistently without increase in pain and with improving function to demonstrate good toleration and effectiveness of intervention.  Baseline: Goal status: Met 04/14/24  2.  Pt will report decrease in pain by at least 50% for improved toleration to activity/quality of life and to demonstrate improved management of pain. Baseline:  Goal status: INITIAL  3.  Pt will improve on 5 X STS test to <or=20s to demonstrate improving functional lower extremity strength, transitional movements, and balance Baseline: 25.21 Goal status: INITIAL  4.  Pt will perform tandem stance and SLS x10s or > to demonstrate improvement in balance Baseline: see chart Goal status: INITIAL  5.  Pt to amb without Trendelenburg demonstrating improvement in right glute strength Baseline: slight Trendelenburg left Goal status: INITIAL   LONG TERM GOALS: to be set at re-cert as approp    PLAN:  PT FREQUENCY: 1-2x weekly  PT DURATION: 6 weeks  PLANNED INTERVENTIONS: 97164- PT Re-evaluation, 97110-Therapeutic exercises, 97530- Therapeutic activity, [id number]- Neuromuscular re-education, 97535- Self Care, [id number]- Manual therapy, [id number]- Gait training, [id number]- Aquatic Therapy, [phone]- Ionotophoresis 4mg /ml Dexamethasone , Patient/Family  education, Balance training, Stair training, Taping, Dry Needling, Joint mobilization, DME instructions, Cryotherapy, and Moist heat  PLAN FOR NEXT SESSION: Aquatics: left glute and hip flex strengthening, balance retraining, pain management, gait  training

## 2024-04-20 ENCOUNTER — Encounter (HOSPITAL_BASED_OUTPATIENT_CLINIC_OR_DEPARTMENT_OTHER): Payer: Self-pay | Admitting: Physical Therapy

## 2024-04-20 ENCOUNTER — Ambulatory Visit (HOSPITAL_BASED_OUTPATIENT_CLINIC_OR_DEPARTMENT_OTHER): Admitting: Physical Therapy

## 2024-04-20 DIAGNOSIS — M6281 Muscle weakness (generalized): Secondary | ICD-10-CM

## 2024-04-20 DIAGNOSIS — R600 Localized edema: Secondary | ICD-10-CM

## 2024-04-20 DIAGNOSIS — M25552 Pain in left hip: Secondary | ICD-10-CM | POA: Diagnosis not present

## 2024-04-20 DIAGNOSIS — R262 Difficulty in walking, not elsewhere classified: Secondary | ICD-10-CM

## 2024-04-20 NOTE — Therapy (Signed)
 OUTPATIENT PHYSICAL THERAPY LOWER EXTREMITY Treatment    Patient Name: Rhonda Porter MRN: [id number] DOB:11-16-1970, 53 y.o., female Today's Date: 04/20/2024  END OF SESSION:  PT End of Session - 04/20/24 1038     Visit Number 5    Date for PT Re-Evaluation 05/01/24    PT Start Time 1019    PT Stop Time 1058    PT Time Calculation (min) 39 min    Behavior During Therapy [facility] for tasks assessed/performed          Past Medical History:  Diagnosis Date   Diverticula of colon    Ganglion cyst    Gastritis    History of colonic polyps    Hypertension    on 03/08/15 pt denies high bp in over 10 yrs    IBS (irritable bowel syndrome)    Melanoma (HCC) 10/29/2004   seasonal allergies    Past Surgical History:  Procedure Laterality Date   ABDOMINAL HYSTERECTOMY  10/29/1997   CARPAL TUNNEL RELEASE Right    COLONOSCOPY  03/08/2015   FOOT SURGERY Bilateral    surg x 3 right, surg x 1 left   GLUTEUS MINIMUS REPAIR Left 07/30/2022   Procedure: LEFT GLUTEUS MEDIUS REPAIR;  Surgeon: Genelle Standing, MD;  Location: [facility] OR;  Service: Orthopedics;  Laterality: Left;   KNEE ARTHROSCOPY Right 10/30/2011   MELANOMA EXCISION  10/29/2004   left shoulder   SPINAL FUSION     UPPER GASTROINTESTINAL ENDOSCOPY  03/08/2015   Dr.Pyrtle   WISDOM TOOTH EXTRACTION     Patient Active Problem List   Diagnosis Date Noted   Tear of left gluteus medius tendon    Lower abdominal pain 04/11/2017   Menopausal symptom 04/11/2017   Pain in pelvis 04/11/2017   LUQ abdominal pain 02/10/2015   Bloating 02/08/2015   Irritable bowel syndrome (IBS) 02/08/2015   Abdominal cramps 02/08/2015   DIVERTICULAR BLEEDING, HX OF 01/18/2009   HYPERTENSION 01/17/2009   History of colonic polyps 01/17/2009    PCP: Rocky Don PA-C  REFERRING PROVIDER:   Genelle Standing, MD    REFERRING DIAG: [phone] (ICD-10-CM) - Tear of left acetabular labrum, initial encounter  THERAPY DIAG:  Pain in left hip  Muscle  weakness (generalized)  Difficulty walking  Localized edema  Rationale for Evaluation and Treatment: Rehabilitation  ONSET DATE: 6 months  SUBJECTIVE:   SUBJECTIVE STATEMENT: Pt reports no change in symptoms.  She bought a thigh sleeve and has worn it, but it pinches in the groin.     PERTINENT HISTORY:  Status post left hip gluteus medius repair. T traumatic labral tear with pathology of the joint.  PAIN:  Are you having pain? Yes: NPRS scale: current 4/10; worst 5/10; least 2/10 Pain location: left hip and groin   Pain description: ache Aggravating factors: walking, sitting prolonged Relieving factors: rest  PRECAUTIONS: None   WEIGHT BEARING RESTRICTIONS No   FALLS:  Has patient fallen in last 6 months? No   LIVING ENVIRONMENT: Lives with: lives with their family and lives with their spouse Lives in: House/apartment Stairs: No, only stairs on back deck Has following equipment at home: None   OCCUPATION: WFH, sitting 8 hours; gets up every 45 mins    PLOF: Independent with basic ADLs   PATIENT GOALS : Improve strength; decrease pain  NEXT MD VISIT: not set  OBJECTIVE:  Note: Objective measures were completed at Evaluation unless otherwise noted.  DIAGNOSTIC FINDINGS: 7/23 MRI left hip IMPRESSION: There is a  tear involving the anterior superior labrum left hip   PATIENT SURVEYS:  LEFS 54/80   COGNITION: Overall cognitive status: Within functional limits for tasks assessed     SENSATION: WFL    LOWER EXTREMITY ROM:  Active ROM Right eval Left eval  Hip flexion    Hip extension    Hip abduction    Hip adduction    Hip internal rotation  30  Hip external rotation  45  Knee flexion    Knee extension    Ankle dorsiflexion    Ankle plantarflexion    Ankle inversion    Ankle eversion     (Blank rows = not tested)  LOWER EXTREMITY MMT:  MMT Right eval Left eval  Hip flexion  4 P!  Hip extension  4P!  Hip abduction  4P!  Hip  adduction  5  Hip internal rotation    Hip external rotation    Knee flexion    Knee extension    Ankle dorsiflexion    Ankle plantarflexion    Ankle inversion    Ankle eversion     (Blank rows = not tested)  LOWER EXTREMITY SPECIAL TESTS:  Positive FADIR Left  FUNCTIONAL TESTS:  5 times sit to stand: 25.21 Timed up and go (TUG): 13.42 4 stage balance: Passed 1&2.  Tandem x 9s; SLS x 3s  GAIT: Distance walked: unlimited Assistive device utilized: None Level of assistance: Complete Independence Comments: Off loading right, slight trendelenburg left                                                                                                                                 TREATMENT [facility] Adult PT Treatment:                                                DATE: 04/20/24 Pt seen for aquatic therapy today.  Treatment took place in water 3.5-4.75 ft in depth at the [facility] pool. Temp of water was 91.  Pt entered/exited the pool via stairs and step to pattern with hand rail.  *walking forward/ backward with yellow hand floats under water at side * side stepping with arm addct/abdct with yellow hand floats -> without hand floats and increased step height * marching backward/ forward walking kicks * seated on bench in water: cycling; hip abdct/ addct (range to tolerance); windshield wiper LE with knees bent;  fig4 stretch-> modified pigeon pose  * manual therapy:  IASTM/STM to L quad and adductors; limited tolerance.  3 squid pieces of sensitive skin rock tape applied to ant L mid quad in basket weave pattern to assist with edema reduction and increased proprioception. Pt reminded of safe tape removal technique; verbalized understanding  Pt requires the buoyancy and hydrostatic pressure of water for support, and to  offload joints by unweighting joint load by at least 50 % in navel deep water and by at least 75-80% in chest to neck deep water.  Viscosity of the water is  needed for resistance of strengthening. Water current perturbations provides challenge to standing balance requiring increased core activation.  PATIENT EDUCATION:  Education details: intro to aquatic therapy  Person educated: Patient Education method: Explanation Education comprehension: verbalized understanding  HOME EXERCISE PROGRAM: Access Code: [email or web address] URL: [email or web address] Date: 04/01/2024 Prepared by: Matilda Kohut  Exercises - Modified Thomas Stretch  - 1 x daily - 7 x weekly - 3 sets - 10 reps - Hip Flexor Stretch at Edge of Bed  - 1 x daily - 7 x weekly - 3 sets - 10 reps - Thomas Stretch on Table  - 1 x daily - 7 x weekly - 3 sets - 10 reps  ASSESSMENT:  CLINICAL IMPRESSION: Pt reports persistent left anterior thigh and groin pain, along with edema in mid L quad. She reports no reduction in pain in LLE during session, but remains motivated to progress.  Focused on left hip ROM and gait in water. Trial of IASTM/STM to palpable tightness in L ant thigh, followed by web of ktape applied.     Initial Impression Patient is a 53 y.o. f who was seen today for physical therapy evaluation and treatment for Tear of left acetabular labrum. Pt presents for trial of aquatic physical therapy x 6 weeks to improve left hip pain and mobility.  She is planning on surgical repair but is required by insurance to trial therapy for potential improvement to determine necessity of surgical procedure.  She has had a recent repair of left glute med tendon with good results. Exam demonstrates weakness in left glutes and hip flexors.  She has a slight trendelenburg on left with gait.  Pain is constant and moderately sensitive.  She will benefit from skilled physical therapy intervention to improve strength , gait and manage pain improving her toleration to functional mobility and ADL's.  OBJECTIVE IMPAIRMENTS: Abnormal gait, difficulty walking, and decreased strength.   ACTIVITY  LIMITATIONS: carrying, lifting, sitting, squatting, stairs, and transfers  PARTICIPATION LIMITATIONS: cleaning, shopping, community activity, and occupation  PERSONAL FACTORS: 1 comorbidity: recent glute med tendon repair are also affecting patient's functional outcome.   REHAB POTENTIAL: Good  CLINICAL DECISION MAKING: Stable/uncomplicated  EVALUATION COMPLEXITY: Low   GOALS: Goals reviewed with patient? Yes  SHORT TERM GOALS: Target date: 05/01/24 Pt will tolerate full aquatic sessions consistently without increase in pain and with improving function to demonstrate good toleration and effectiveness of intervention.  Baseline: Goal status: Met 04/14/24  2.  Pt will report decrease in pain by at least 50% for improved toleration to activity/quality of life and to demonstrate improved management of pain. Baseline:  Goal status: INITIAL  3.  Pt will improve on 5 X STS test to <or=20s to demonstrate improving functional lower extremity strength, transitional movements, and balance Baseline: 25.21 Goal status: INITIAL  4.  Pt will perform tandem stance and SLS x10s or > to demonstrate improvement in balance Baseline: see chart Goal status: INITIAL  5.  Pt to amb without Trendelenburg demonstrating improvement in right glute strength Baseline: slight Trendelenburg left Goal status: INITIAL   LONG TERM GOALS: to be set at re-cert as approp    PLAN:  PT FREQUENCY: 1-2x weekly  PT DURATION: 6 weeks  PLANNED INTERVENTIONS: 97164- PT Re-evaluation, 97110-Therapeutic exercises, 97530- Therapeutic activity, [id number]- Neuromuscular re-education, 97535-  Self Care, 02859- Manual therapy, [phone]- Gait training, [phone]- Aquatic Therapy, [phone]- Ionotophoresis 4mg /ml Dexamethasone , Patient/Family education, Balance training, Stair training, Taping, Dry Needling, Joint mobilization, DME instructions, Cryotherapy, and Moist heat  PLAN FOR NEXT SESSION: Aquatics: left glute and hip flex  strengthening, balance retraining, pain management, gait training   Delon Aquas, PTA 04/20/24 12:54 PM [facility] [address] [place], [place], [address] Phone: [phone]   Fax:  [phone]

## 2024-04-27 ENCOUNTER — Ambulatory Visit (HOSPITAL_BASED_OUTPATIENT_CLINIC_OR_DEPARTMENT_OTHER): Admitting: Physical Therapy

## 2024-04-27 ENCOUNTER — Encounter (HOSPITAL_BASED_OUTPATIENT_CLINIC_OR_DEPARTMENT_OTHER): Payer: Self-pay | Admitting: Physical Therapy

## 2024-04-27 DIAGNOSIS — R262 Difficulty in walking, not elsewhere classified: Secondary | ICD-10-CM

## 2024-04-27 DIAGNOSIS — R600 Localized edema: Secondary | ICD-10-CM

## 2024-04-27 DIAGNOSIS — M25552 Pain in left hip: Secondary | ICD-10-CM

## 2024-04-27 DIAGNOSIS — M6281 Muscle weakness (generalized): Secondary | ICD-10-CM

## 2024-04-27 NOTE — Therapy (Addendum)
 OUTPATIENT PHYSICAL THERAPY LOWER EXTREMITY Treatment PHYSICAL THERAPY DISCHARGE SUMMARY  Visits from Start of Care: 6  Current functional level related to goals / functional outcomes: Indep with pain   Remaining deficits: Hip pain and dysfunction   Education / Equipment: Management of condition;HEP   Patient agrees to discharge. Patient goals were partially met. Patient is being discharged due to lack of progress.  Addend Ronal Kem) Ziemba MPT 05/27/24 5:23 PM [facility] [address] [place], [place], [address] Phone: [phone]   Fax:  [phone]     Patient Name: Rhonda Porter MRN: [id number] DOB:1971/09/29, 53 y.o., female Today's Date: 04/27/2024  END OF SESSION:  PT End of Session - 04/27/24 0859     Visit Number 6    Date for PT Re-Evaluation 05/01/24    PT Start Time 0845    PT Stop Time 0923    PT Time Calculation (min) 38 min    Activity Tolerance Patient tolerated treatment well    Behavior During Therapy [facility] for tasks assessed/performed          Past Medical History:  Diagnosis Date   Diverticula of colon    Ganglion cyst    Gastritis    History of colonic polyps    Hypertension    on 03/08/15 pt denies high bp in over 10 yrs    IBS (irritable bowel syndrome)    Melanoma (HCC) 10/29/2004   seasonal allergies    Past Surgical History:  Procedure Laterality Date   ABDOMINAL HYSTERECTOMY  10/29/1997   CARPAL TUNNEL RELEASE Right    COLONOSCOPY  03/08/2015   FOOT SURGERY Bilateral    surg x 3 right, surg x 1 left   GLUTEUS MINIMUS REPAIR Left 07/30/2022   Procedure: LEFT GLUTEUS MEDIUS REPAIR;  Surgeon: Genelle Standing, MD;  Location: [facility] OR;  Service: Orthopedics;  Laterality: Left;   KNEE ARTHROSCOPY Right 10/30/2011   MELANOMA EXCISION  10/29/2004   left shoulder   SPINAL FUSION     UPPER GASTROINTESTINAL ENDOSCOPY  03/08/2015   Dr.Pyrtle   WISDOM TOOTH EXTRACTION     Patient  Active Problem List   Diagnosis Date Noted   Tear of left gluteus medius tendon    Lower abdominal pain 04/11/2017   Menopausal symptom 04/11/2017   Pain in pelvis 04/11/2017   LUQ abdominal pain 02/10/2015   Bloating 02/08/2015   Irritable bowel syndrome (IBS) 02/08/2015   Abdominal cramps 02/08/2015   DIVERTICULAR BLEEDING, HX OF 01/18/2009   HYPERTENSION 01/17/2009   History of colonic polyps 01/17/2009    PCP: Rocky Don PA-C  REFERRING PROVIDER:   Genelle Standing, MD    REFERRING DIAG: [phone] (ICD-10-CM) - Tear of left acetabular labrum, initial encounter  THERAPY DIAG:  Pain in left hip  Muscle weakness (generalized)  Difficulty walking  Localized edema  Rationale for Evaluation and Treatment: Rehabilitation  ONSET DATE: 6 months  SUBJECTIVE:   SUBJECTIVE STATEMENT: Pt reports no improvement in swelling with tape application.  Pain was high on Friday up to 9/10; couldn't finish work day.  Pt reports she rested the remaining part of weekend and took medication for relief.       PERTINENT HISTORY:  Status post left hip gluteus medius repair. T traumatic labral tear with pathology of the joint.  PAIN:  Are you having pain? Yes: NPRS scale: current 3-4/10 Pain location: left hip and groin   Pain description: ache Aggravating factors: walking, sitting prolonged Relieving factors:  rest  PRECAUTIONS: None   WEIGHT BEARING RESTRICTIONS No   FALLS:  Has patient fallen in last 6 months? No   LIVING ENVIRONMENT: Lives with: lives with their family and lives with their spouse Lives in: House/apartment Stairs: No, only stairs on back deck Has following equipment at home: None   OCCUPATION: WFH, sitting 8 hours; gets up every 45 mins    PLOF: Independent with basic ADLs   PATIENT GOALS : Improve strength; decrease pain  NEXT MD VISIT: not set  OBJECTIVE:  Note: Objective measures were completed at Evaluation unless otherwise noted.  DIAGNOSTIC  FINDINGS: 7/23 MRI left hip IMPRESSION: There is a tear involving the anterior superior labrum left hip   PATIENT SURVEYS:  LEFS 54/80   COGNITION: Overall cognitive status: Within functional limits for tasks assessed     SENSATION: WFL    LOWER EXTREMITY ROM:  Active ROM Right eval Left eval  Hip flexion    Hip extension    Hip abduction    Hip adduction    Hip internal rotation  30  Hip external rotation  45  Knee flexion    Knee extension    Ankle dorsiflexion    Ankle plantarflexion    Ankle inversion    Ankle eversion     (Blank rows = not tested)  LOWER EXTREMITY MMT:  MMT Right eval Left eval  Hip flexion  4 P!  Hip extension  4P!  Hip abduction  4P!  Hip adduction  5  Hip internal rotation    Hip external rotation    Knee flexion    Knee extension    Ankle dorsiflexion    Ankle plantarflexion    Ankle inversion    Ankle eversion     (Blank rows = not tested)  LOWER EXTREMITY SPECIAL TESTS:  Positive FADIR Left  FUNCTIONAL TESTS:  5 times sit to stand: 25.21 Timed up and go (TUG): 13.42 4 stage balance: Passed 1&2.  Tandem x 9s; SLS x 3s   04/27/24:   5x STS:  27.65 Tandem stance  12.3sec RLE in back, 6.2sec LLE in back SLS: RLE 6.5sec, LLE1.3sec  GAIT: Distance walked: unlimited Assistive device utilized: None Level of assistance: Complete Independence Comments: Off loading right, slight trendelenburg left                                                                                                                                 TREATMENT [facility] Adult PT Treatment:                                                DATE: 04/27/24 Pt seen for aquatic therapy today.  Treatment took place in water 3.5-4.75 ft in depth at the [facility] pool. Temp of water was 91.  Pt  entered/exited the pool via stairs and step to pattern with hand rail.  * testing - see above  *walking forward/ backward multiple laps * side stepping multiple  laps * marching backward/ forward walking kicks * straddling yellow noodle and holding corner- gentle cycling, hip abdct/ addct, hip flexion/ext * 3 way LE stretch (ITB, hamstring, adductor) with L ankle on blue hollow noodle - limited tolerance  Pt requires the buoyancy and hydrostatic pressure of water for support, and to offload joints by unweighting joint load by at least 50 % in navel deep water and by at least 75-80% in chest to neck deep water.  Viscosity of the water is needed for resistance of strengthening. Water current perturbations provides challenge to standing balance requiring increased core activation.  PATIENT EDUCATION:  Education details: intro to aquatic therapy  Person educated: Patient Education method: Explanation Education comprehension: verbalized understanding  HOME EXERCISE PROGRAM: Access Code: [email or web address] URL: [email or web address] Date: 04/01/2024 Prepared by: Matilda Kohut  Exercises - Modified Thomas Stretch  - 1 x daily - 7 x weekly - 3 sets - 10 reps - Hip Flexor Stretch at Edge of Bed  - 1 x daily - 7 x weekly - 3 sets - 10 reps - Thomas Stretch on Table  - 1 x daily - 7 x weekly - 3 sets - 10 reps  ASSESSMENT:  CLINICAL IMPRESSION: Pt reports persistent left anterior thigh and groin pain, along with edema in mid L quad. She reports slight increase in L buttock pain with hamstring stretch at end of session.  Focused on left hip ROM and gait in water. Pt only met STG 1. Pt is planning for surgical repair after follow up with physician.     OBJECTIVE IMPAIRMENTS: Abnormal gait, difficulty walking, and decreased strength.   ACTIVITY LIMITATIONS: carrying, lifting, sitting, squatting, stairs, and transfers  PARTICIPATION LIMITATIONS: cleaning, shopping, community activity, and occupation  PERSONAL FACTORS: 1 comorbidity: recent glute med tendon repair are also affecting patient's functional outcome.   REHAB POTENTIAL: Good  CLINICAL  DECISION MAKING: Stable/uncomplicated  EVALUATION COMPLEXITY: Low   GOALS: Goals reviewed with patient? Yes  SHORT TERM GOALS: Target date: 05/01/24 Pt will tolerate full aquatic sessions consistently without increase in pain and with improving function to demonstrate good toleration and effectiveness of intervention.  Baseline: Goal status: Met 04/14/24  2.  Pt will report decrease in pain by at least 50% for improved toleration to activity/quality of life and to demonstrate improved management of pain. Baseline:  Goal status: not met - 04/27/24  3.  Pt will improve on 5 X STS test to <or=20s to demonstrate improving functional lower extremity strength, transitional movements, and balance Baseline: see above Goal status: Not met - 04/27/24  4.  Pt will perform tandem stance and SLS x10s or > to demonstrate improvement in balance Baseline: see chart Goal status: not met -04/27/24  5.  Pt to amb without Trendelenburg demonstrating improvement in right glute strength Baseline: slight Trendelenburg left Goal status: not test - 04/27/24   LONG TERM GOALS: to be set at re-cert as approp    PLAN:  PT FREQUENCY: 1-2x weekly  PT DURATION: 6 weeks  PLANNED INTERVENTIONS: 97164- PT Re-evaluation, 97110-Therapeutic exercises, 97530- Therapeutic activity, 97112- Neuromuscular re-education, 97535- Self Care, 02859- Manual therapy, [id number]- Gait training, [phone]- Aquatic Therapy, [phone]- Ionotophoresis 4mg /ml Dexamethasone , Patient/Family education, Balance training, Stair training, Taping, Dry Needling, Joint mobilization, DME instructions, Cryotherapy, and Moist heat  Delon Aquas, PTA 04/27/24 1:08 PM [facility] [address] [place], [place], [address] Phone: [phone]   Fax:  [phone]

## 2024-04-29 ENCOUNTER — Ambulatory Visit (HOSPITAL_BASED_OUTPATIENT_CLINIC_OR_DEPARTMENT_OTHER): Payer: Self-pay | Admitting: Orthopaedic Surgery

## 2024-04-29 ENCOUNTER — Ambulatory Visit (HOSPITAL_BASED_OUTPATIENT_CLINIC_OR_DEPARTMENT_OTHER): Admitting: Orthopaedic Surgery

## 2024-04-29 DIAGNOSIS — S73192A Other sprain of left hip, initial encounter: Secondary | ICD-10-CM

## 2024-04-29 NOTE — Progress Notes (Signed)
 Post Operative Evaluation    Procedure/Date of Surgery: Left hip gluteus medius repair 07/30/22  Interval History:   Presents today for follow-up of her left hip.  Overall she is continuing to have C-shaped pain in the left hip and groin area.  She is here today for further discussion.  She has now trialed physical therapy without any relief of her hip and groin pain  PMH/PSH/Family History/Social History/Meds/Allergies:    Past Medical History:  Diagnosis Date   Diverticula of colon    Ganglion cyst    Gastritis    History of colonic polyps    Hypertension    on 03/08/15 pt denies high bp in over 10 yrs    IBS (irritable bowel syndrome)    Melanoma (HCC) 10/29/2004   seasonal allergies    Past Surgical History:  Procedure Laterality Date   ABDOMINAL HYSTERECTOMY  10/29/1997   CARPAL TUNNEL RELEASE Right    COLONOSCOPY  03/08/2015   FOOT SURGERY Bilateral    surg x 3 right, surg x 1 left   GLUTEUS MINIMUS REPAIR Left 07/30/2022   Procedure: LEFT GLUTEUS MEDIUS REPAIR;  Surgeon: Genelle Standing, MD;  Location: [facility] OR;  Service: Orthopedics;  Laterality: Left;   KNEE ARTHROSCOPY Right 10/30/2011   MELANOMA EXCISION  10/29/2004   left shoulder   SPINAL FUSION     UPPER GASTROINTESTINAL ENDOSCOPY  03/08/2015   Dr.Pyrtle   WISDOM TOOTH EXTRACTION     Social History   Socioeconomic History   Marital status: Married    Spouse name: Not on file   Number of children: 2   Years of education: Not on file   Highest education level: Not on file  Occupational History   Occupation: Radio broadcast assistant: [employer]  Tobacco Use   Smoking status: Every Day    Current packs/day: 1.00    Average packs/day: 1 pack/day for 34.0 years (34.0 ttl pk-yrs)    Types: Cigarettes   Smokeless tobacco: Never   Tobacco comments:    Quit at age 89yr  Vaping Use   Vaping status: Never Used  Substance and Sexual Activity   Alcohol use: Not Currently     Comment: once a month per pt   Drug use: No   Sexual activity: Yes    Birth control/protection: Surgical    Comment: Hysterectomy  Other Topics Concern   Not on file  Social History Narrative   Not on file   Social Drivers of Health   Financial Resource Strain: Not on file  Food Insecurity: Not on file  Transportation Needs: Not on file  Physical Activity: Not on file  Stress: Not on file  Social Connections: Not on file   Family History  Problem Relation Age of Onset   Colon polyps Mother    Hypertension Mother    Leukemia Maternal Grandmother    Lymphoma Maternal Grandmother    Colon cancer Neg Hx    Esophageal cancer Neg Hx    Rectal cancer Neg Hx    Stomach cancer Neg Hx    Allergies  Allergen Reactions   Cetirizine Hcl Other (See Comments)    Not effective   Hydrocodone Itching    Have hypersensitivity to most pain meds   Other    Cephalexin Rash  Penicillins Rash   Sulfa Antibiotics Rash   Current Outpatient Medications  Medication Sig Dispense Refill   Ascorbic Acid (VITAMIN C PO) Take 500 mg by mouth daily.     Cholecalciferol (VITAMIN D-3) 125 MCG (5000 UT) TABS Take by mouth.     dicyclomine  (BENTYL ) 20 MG tablet Take 1 tablet (20 mg total) by mouth daily as needed for spasms. 90 tablet 3   ELDERBERRY PO Take 50 mg by mouth daily. Gummy     famotidine  (PEPCID ) 20 MG tablet Take 1 tablet (20 mg total) by mouth at bedtime as needed for heartburn or indigestion. 90 tablet 3   fluticasone (FLONASE) 50 MCG/ACT nasal spray Place 1 spray into both nostrils daily as needed for allergies.     HYDROmorphone  (DILAUDID ) 2 MG tablet Take 1 tablet (2 mg total) by mouth every 4 (four) hours as needed for severe pain (pain score 7-10). 20 tablet 0   levocetirizine (XYZAL) 5 MG tablet Take 5 mg by mouth every evening.     losartan-hydrochlorothiazide (HYZAAR) 50-12.5 MG tablet Take 1 tablet by mouth daily.     pantoprazole  (PROTONIX ) 40 MG tablet Take 1 tablet (40 mg  total) by mouth daily. 90 tablet 3   saccharomyces boulardii (FLORASTOR) 250 MG capsule Take 500 mg by mouth daily.     vitamin E 180 MG (400 UNITS) capsule Take 400 Units by mouth daily.     No current facility-administered medications for this visit.   No results found.  Review of Systems:   A ROS was performed including pertinent positives and negatives as documented in the HPI.   Musculoskeletal Exam:    There were no vitals taken for this visit.  Left hip is well-healed.  Sensation is intact in all distributions.  Positive FADIR maneuver with 30 degrees internal rotation with the hip in the 100 degrees of flexion.  External rotation is 45.  improved strength with abduction of the hip nearly equal to contralateral side.  She does still have a mild Trendelenburg gait.  There is tenderness about the ischial tuberosity with deep palpation as well as the abductor tubercle.  Distal neurosensory exam is intact with 2+ dorsalis pedis pulse  Imaging:    MRI left hip: There is a tear involving the anterior superior labrum left hip  I personally reviewed and interpreted the radiographs.   Assessment:   Status post left hip gluteus medius repair.  At today's visit she has having symptoms more consistent with labral pathology.  She does have a labral tear seen on MRI.  I did discuss that unfortunately she appears to have evidence of a traumatic labral tear with pathology of the joint.  She is having mechanical symptoms involving the joint with clicking and catching in the left hip.  She has had an injection as well as now multiple months of physical therapy.  Given that we did discuss the possibility of left hip arthroscopy with labral repair.  At this time she has failed 6 weeks of physical therapy at this time she is ready for this.  I did discuss the risks and limitations.  I did discuss the specific rehab that would allow for immediate weightbearing.  After discussion she has elected for  this Plan :    -Plan for left hip arthroscopic labral repair   After a lengthy discussion of treatment options, including risks, benefits, alternatives, complications of surgical and nonsurgical conservative options, the patient elected surgical repair.   The patient  is aware of the material risks  and complications including, but not limited to injury to adjacent structures, neurovascular injury, infection, numbness, bleeding, implant failure, thermal burns, stiffness, persistent pain, failure to heal, disease transmission from allograft, need for further surgery, dislocation, anesthetic risks, blood clots, risks of death,and others. The probabilities of surgical success and failure discussed with patient given their particular co-morbidities.The time and nature of expected rehabilitation and recovery was discussed.The patient's questions were all answered preoperatively.  No barriers to understanding were noted. I explained the natural history of the disease process and Rx rationale.  I explained to the patient what I considered to be reasonable expectations given their personal situation.  The final treatment plan was arrived at through a shared patient decision making process model.    I personally saw and evaluated the patient, and participated in the management and treatment plan.  Elspeth Parker, MD Attending Physician, Orthopedic Surgery  This document was dictated using Dragon voice recognition software. A reasonable attempt at proof reading has been made to minimize errors.

## 2024-04-30 NOTE — Addendum Note (Signed)
 Addended by: WOLFGANG CONLEY HERO on: 04/30/2024 09:55 AM   Modules accepted: Orders

## 2024-05-13 ENCOUNTER — Telehealth (HOSPITAL_BASED_OUTPATIENT_CLINIC_OR_DEPARTMENT_OTHER): Payer: Self-pay | Admitting: Orthopaedic Surgery

## 2024-05-13 NOTE — Telephone Encounter (Addendum)
 I spoke with the patient. She has been scheduled for surgery on 8/4. She does not have a question in regards to surgery date. Patient requests a call from [facility] when she returns to the office in regards to her FMLA paperwork.

## 2024-05-13 NOTE — Telephone Encounter (Signed)
 Patient has questions about her surgery date and FMLA please contact patient [phone]

## 2024-05-14 ENCOUNTER — Other Ambulatory Visit: Payer: Self-pay | Admitting: Internal Medicine

## 2024-05-18 ENCOUNTER — Telehealth: Payer: Self-pay | Admitting: *Deleted

## 2024-05-18 NOTE — Telephone Encounter (Signed)
 Received

## 2024-05-18 NOTE — Telephone Encounter (Signed)
 Pt. Submitted Medical release form FMLA & $20 payment

## 2024-06-05 NOTE — Telephone Encounter (Signed)
-----   Message from Bellwood Z sent at 06/05/2024 10:14 AM EDT ----- Received vm from [place] member advocate regarding denial for Rhonda Porter. Requesting a call back please. Ey#[phone] Dr. Genelle, MD patient.  Patient asking for assistance with [facility] regarding her denied pre-cert. Thank you.  Rhonda Porter

## 2024-06-05 NOTE — Telephone Encounter (Signed)
 Left vm for Rhonda Porter to return my call.

## 2024-06-08 ENCOUNTER — Ambulatory Visit: Admitting: Physical Therapy

## 2024-06-12 ENCOUNTER — Encounter (HOSPITAL_BASED_OUTPATIENT_CLINIC_OR_DEPARTMENT_OTHER): Admitting: Orthopaedic Surgery

## 2024-07-14 ENCOUNTER — Encounter (HOSPITAL_BASED_OUTPATIENT_CLINIC_OR_DEPARTMENT_OTHER): Payer: Self-pay | Admitting: Orthopaedic Surgery

## 2024-07-14 ENCOUNTER — Other Ambulatory Visit (HOSPITAL_BASED_OUTPATIENT_CLINIC_OR_DEPARTMENT_OTHER): Payer: Self-pay | Admitting: Orthopaedic Surgery

## 2024-07-14 MED ORDER — HYDROMORPHONE HCL 2 MG PO TABS: 2.0000 mg | ORAL_TABLET | ORAL | 0 refills | Status: AC | PRN

## 2024-07-16 ENCOUNTER — Encounter (HOSPITAL_BASED_OUTPATIENT_CLINIC_OR_DEPARTMENT_OTHER): Payer: Self-pay | Admitting: Orthopaedic Surgery

## 2024-07-16 DIAGNOSIS — S73192A Other sprain of left hip, initial encounter: Secondary | ICD-10-CM | POA: Diagnosis not present

## 2024-07-16 NOTE — Progress Notes (Signed)
 Date of Surgery: 07/16/2024  INDICATIONS: [name] is a 53 y.o.-year-old female with left hip labral tear.  The risk and benefits of the procedure were discussed in detail and documented in the pre-operative evaluation.   PREOPERATIVE DIAGNOSIS: 1. Left hip labral tear  POSTOPERATIVE DIAGNOSIS: Same.  PROCEDURE: 1. Left hip labral repair  SURGEON: Elspeth LITTIE Parker MD  ASSISTANT: Conley Dawson, ATC  ANESTHESIA:  general  IV FLUIDS AND URINE: See anesthesia record.  ANTIBIOTICS: Ancef   ESTIMATED BLOOD LOSS: 10 mL.  IMPLANTS:  * No surgical log found *  DRAINS: None  CULTURES: None  COMPLICATIONS: none  DESCRIPTION OF PROCEDURE:   Cartilage Intact femoral and acetabular cartilage   Labrum normplastic appearing with rug sign   Boundaries of labral tear Convention (3 o'clock anterior, 9 o'clock posterior) Anterior boundary: 3 o'clock Posterior boundary: 1 o'clock   OPERATIVE REPORT:  The patient was brought to the operating room, placed supine on the operating table, and bony prominences were padded.  The traction boots were applied with padding to ensure that safe traction could be applied through the feet.  The contralateral limb was abducted maximally and light traction was applied.  The operative leg was brought into neutral position.  The flouroscopic c-arm was brought between the legs for an AP image.  The patient was prepped and draped in a sterile fashion.  Time-out was performed and landmarks were identified. Traction was obtained and care was taken to ensure the least amount of force necessary to allow safe access to the joint of 8-84mm.  This was checked with fluoroscopy.    Next we placed an anterolateral portal under the assistance of fluoroscopy.  First, fluoroscopy was used to estimate the trajectory and starting point.  A 5mm incision with a #11 blade was made and a straight hemostat was used to dilate the portal through the appropriate tract.  We then  placed a 14-gauge hypodermic needle with careful technique to be as close to the femoral head as possible and parallel to the sorcele to ensure no iatrogenic damage to the labrum.  This released the negative pressure environment and the amount of traction was adjusted to maintain the 8-48mm of distraction.  A nitinol wire was placed through the needle and flouroscopy was used to ensure it extended to the medial wall of the acetabulum.  The Flowport from [employer] was placed over the wire and the nitinol wire was retracted to just inside the capsule during insertion of the dilator and cannula to minimize the risk of breakage. The arthroscope was placed next and we visualized the anterior triangle.     We then placed the anterior portal under direct visualization using the technique described above.  This was safely placed as well without damage to the labrum or femoral head.  We then switched our arthroscope to the anterior portal to ensure we were not through the labrum - we were safely through the capsule only.  We then proceeded with periportal capsulotomies utilizing the Samurai blade in each portal without connecting the two.  We identified the anterior inferior iliac spine proximally, the psoas tendon medially and the rectus tendon laterally as landmarks.  We then proceeded with a diagnostic arthroscopy - the results can be found in the findings section above.    We then used the radiofrequency device to clear the superior acetabulum and expose the subspinous region.  Next we exposed the acetabular rim leaving the chondral labral junction intact.  When adequate reshaping was obtained we then proceeded with the labral repair. We placed 2 anchors at the 1:00 and 2:30 positions. The sutures were passed using the crescent Nanopass from [employer].  This resulted in anatomic labral repair.  We debrided the loose cartilage at the rim and residual degenerative labral tissue.  Traction was let down  with total traction time of 22 minutes.     Finally, we performed a complete capsular closure with tape suture.  She was replaced in the anterior and posterior limb of the reported capsulotomy with excellent apposition. We then removed the arthroscope and closed the incisions with 3-0 nylon simple stitches.  A sterile dressing was applied..  The patient was awakened from anesthesia and transferred to PACU in stable condition. Postoperative care includes:       POSTOPERATIVE PLAN:    Weight bearing as tolerated operative extremity Formal physical therapy will begin immediately within the first weeks of surgery ASA 325 Daily for DVT prophylaxis    Elspeth LITTIE Parker, MD 1:40 PM

## 2024-07-17 ENCOUNTER — Other Ambulatory Visit (HOSPITAL_BASED_OUTPATIENT_CLINIC_OR_DEPARTMENT_OTHER): Payer: Self-pay | Admitting: Student

## 2024-07-17 ENCOUNTER — Telehealth (HOSPITAL_BASED_OUTPATIENT_CLINIC_OR_DEPARTMENT_OTHER): Payer: Self-pay | Admitting: Student

## 2024-07-17 MED ORDER — ONDANSETRON HCL 4 MG PO TABS: 4.0000 mg | ORAL_TABLET | Freq: Every day | ORAL | 1 refills | Status: AC | PRN

## 2024-07-17 NOTE — Telephone Encounter (Signed)
 Called pt and she is going to come by the office to get a new bandage

## 2024-07-17 NOTE — Telephone Encounter (Signed)
 Patient's husband stopped by front desk at [facility] to request anti-nausea meds for the patient. Please send the medication to their usual pharmacy; [facility] in [place].

## 2024-07-20 NOTE — Therapy (Signed)
 OUTPATIENT PHYSICAL THERAPY EVALUATION   Patient Name: Rhonda Porter MRN: [id number] DOB:1970/11/17, 53 y.o., female Today's Date: 07/21/2024  END OF SESSION:  PT End of Session - 07/21/24 1147     Visit Number 1    Number of Visits 15    Date for Recertification  09/15/24    PT Start Time 1000    PT Stop Time 1045    PT Time Calculation (min) 45 min    Activity Tolerance Patient tolerated treatment well    Behavior During Therapy [facility] for tasks assessed/performed          Past Medical History:  Diagnosis Date   Diverticula of colon    Ganglion cyst    Gastritis    History of colonic polyps    Hypertension    on 03/08/15 pt denies high bp in over 10 yrs    IBS (irritable bowel syndrome)    Melanoma (HCC) 10/29/2004   seasonal allergies    Past Surgical History:  Procedure Laterality Date   ABDOMINAL HYSTERECTOMY  10/29/1997   CARPAL TUNNEL RELEASE Right    COLONOSCOPY  03/08/2015   FOOT SURGERY Bilateral    surg x 3 right, surg x 1 left   GLUTEUS MINIMUS REPAIR Left 07/30/2022   Procedure: LEFT GLUTEUS MEDIUS REPAIR;  Surgeon: Genelle Standing, MD;  Location: [facility] OR;  Service: Orthopedics;  Laterality: Left;   KNEE ARTHROSCOPY Right 10/30/2011   MELANOMA EXCISION  10/29/2004   left shoulder   SPINAL FUSION     UPPER GASTROINTESTINAL ENDOSCOPY  03/08/2015   Dr.Pyrtle   WISDOM TOOTH EXTRACTION     Patient Active Problem List   Diagnosis Date Noted   Tear of left gluteus medius tendon    Lower abdominal pain 04/11/2017   Menopausal symptom 04/11/2017   Pain in pelvis 04/11/2017   LUQ abdominal pain 02/10/2015   Bloating 02/08/2015   Irritable bowel syndrome (IBS) 02/08/2015   Abdominal cramps 02/08/2015   DIVERTICULAR BLEEDING, HX OF 01/18/2009   HYPERTENSION 01/17/2009   History of colonic polyps 01/17/2009    PCP: Dow Longs, PA-C   REFERRING PROVIDER: Genelle Standing, MD   REFERRING DIAG: [id number] (ICD-10-CM) - Tear of left acetabular labrum,  initial encounter   Rationale for Evaluation and Treatment:  Rehabiliation  THERAPY DIAG:  Pain in left hip  Muscle weakness (generalized)  Difficulty walking  ONSET DATE: post op hip labrum repair on left 07/16/24   SUBJECTIVE:  SUBJECTIVE STATEMENT: She had hip labral repair. Since she is now doing okay, WBAT. She did take meds before coming.   PERTINENT HISTORY:  See above PMH  PAIN:  NPRS scale: 3/10 upon arrival Pain location:left hip around incision Pain description: constant sore Aggravating factors: standing too long Relieving factors: rest, meds, ice   PRECAUTIONS: ,  Hip post op protocol  RED FLAGS: None   WEIGHT BEARING RESTRICTIONS:  WBAT  FALLS:  Has patient fallen in last 6 months? 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  PATIENT SURVEYS:  Patient-Specific Activity Scoring Scheme  0 represents "unable to perform." 10 represents "able to perform at prior level. 0 1 2 3 4 5 6 7 8 9  10 (Date and Score)   Activity Eval     1. walking 3    2. Getting in/out of bed  6    3.     4.    5.    Score 4.5    Total score = sum of the activity scores/number of activities Minimum detectable change (90%CI) for average score = 2 points Minimum detectable change (90%CI) for single activity score = 3 points     EDEMA:  Yes: in left hip  GAIT: Eval: Assistive device utilized: None Level of assistance: Complete Independence Comments: slow ambulation, shorter steps, antalgic gait, limited distances    LOWER EXTREMITY ROM:     Active /PROM Left eval   Hip flexion 70/90   Hip extension     Hip abduction 20/30   Hip adduction    Hip internal rotation /30   Hip external rotation /30   Knee flexion    Knee extension    Ankle dorsiflexion    Ankle plantarflexion    Ankle inversion    Ankle eversion     (Blank rows = not tested)   LOWER EXTREMITY MMT:    MMT Left Eval   Hip flexion 2   Hip extension    Hip abduction 2   Hip adduction    Hip internal rotation 2   Hip external rotation 2   Knee flexion 3   Knee extension 3   Ankle dorsiflexion    Ankle plantarflexion    Ankle inversion    Ankle eversion     (Blank rows = not tested)    FUNCTIONAL TESTS:  Eval: 5 times sit to stand: 47 seconds                                                                                                                              TREATMENT DATE:  Eval HEP creation and review with demonstration and trial set preformed, see below for details Nu step L1 X 5 min UE/LE seat #5 Self care: discussed post op precautions, not to  push through too much pain, PT plan of care and healing times    PATIENT EDUCATION: Education details: HEP, PT plan of care, selfcare Person educated: Patient Education method: Explanation, Demonstration, Verbal cues, and Handouts Education comprehension: verbalized understanding, further education recommended   HOME EXERCISE PROGRAM: Access Code: [email or web address] URL: [email or web address] Date: 07/21/2024 Prepared by: Redell Moose  Exercises - Supine Heel Slide with Strap  - 2 x daily - 6 x weekly - 1 sets - 10 reps - Bent Knee Fallouts  - 2 x daily - 6 x weekly - 1 sets - 10 reps - Seated Hip Adduction Isometrics with Ball  - 2 x daily - 6 x weekly - 1 sets - 10 reps - 5 sec hold - Standing March with Counter Support  - 2 x daily - 6 x weekly - 1 sets - 10 reps - Standing Hip Abduction with Counter Support  - 2 x daily - 6 x weekly - 1 sets - 10 reps  ASSESSMENT:  CLINICAL IMPRESSION: Patient referred to PT post op hip labrum  repair on left 07/16/24. Overall doing well up to this point and she is WBAT. I reviewed post op precautions with her today and she will follow back up with surgeon next week. Patient will benefit from skilled PT to improve overall function and to address impairments and limitations listed below.  OBJECTIVE IMPAIRMENTS: decreased activity tolerance for ADL's, difficulty walking, decreased balance, decreased endurance, decreased mobility, decreased ROM, decreased strength, impaired flexibility, impaired LE use, and pain.  ACTIVITY LIMITATIONS: bending, liftting, walking, standing, cleaning, community activity, driving,   PERSONAL FACTORS: see above PMH  also affecting patient's functional outcome.  REHAB POTENTIAL: Good  CLINICAL DECISION MAKING: Stable/uncomplicated  EVALUATION COMPLEXITY: Low    GOALS: Short term PT Goals Target date: 08/18/2024   Pt will be I and compliant with HEP. Baseline:  Goal status: New  2. Pt will improve 5 times sit to stand test to less than 25 seconds  Baseline 47 sec  Goal status: NEW Long term PT goals Target date:09/15/2024   Pt will improve left hip AROM to [facility] to improve functional mobility Baseline: Goal status: New Pt will improve left LE  strength to at least 4+/5 MMT to improve functional strength Baseline: Goal status: New Pt will improve Patient specific functional scale (PSFS) to at least 7/10 to show improved function level Baseline: Goal status: New Pt will reduce pain to overall less than 3/10 with usual activity and with ambulating community distances Baseline: Goal status: New Pt will be improve 5 times sit to stand test to less than 13 seconds Baseline:47 sec Goal status: New  PLAN: PT FREQUENCY: 1-2 times per week   PT DURATION: 6-8 weeks  PLANNED INTERVENTIONS  97110-Therapeutic exercises, 97530- Therapeutic activity, [id number]- Neuromuscular re-education, 97535- Self Care, [id number]- Manual therapy, and Patient/Family  education  PLAN FOR NEXT SESSION: review HEP, beginner hip/glute activation and ROM within protocol parameters NEXT MD VISIT: 07/30/24  Redell JONELLE Moose, PT,DPT 07/21/2024, 11:48 AM

## 2024-07-21 ENCOUNTER — Encounter: Payer: Self-pay | Admitting: Physical Therapy

## 2024-07-21 ENCOUNTER — Ambulatory Visit: Attending: Orthopaedic Surgery | Admitting: Physical Therapy

## 2024-07-21 DIAGNOSIS — S73192A Other sprain of left hip, initial encounter: Secondary | ICD-10-CM | POA: Insufficient documentation

## 2024-07-21 DIAGNOSIS — M25552 Pain in left hip: Secondary | ICD-10-CM | POA: Insufficient documentation

## 2024-07-21 DIAGNOSIS — R262 Difficulty in walking, not elsewhere classified: Secondary | ICD-10-CM | POA: Insufficient documentation

## 2024-07-21 DIAGNOSIS — M6281 Muscle weakness (generalized): Secondary | ICD-10-CM | POA: Insufficient documentation

## 2024-07-22 ENCOUNTER — Telehealth: Payer: Self-pay | Admitting: Physical Therapy

## 2024-07-22 NOTE — Telephone Encounter (Signed)
 Called to see if patient could come in on 10/1 at 1pm no answer left vm

## 2024-07-24 ENCOUNTER — Ambulatory Visit (HOSPITAL_COMMUNITY): Attending: Orthopaedic Surgery

## 2024-07-24 DIAGNOSIS — M25552 Pain in left hip: Secondary | ICD-10-CM | POA: Insufficient documentation

## 2024-07-24 DIAGNOSIS — M6281 Muscle weakness (generalized): Secondary | ICD-10-CM | POA: Insufficient documentation

## 2024-07-24 DIAGNOSIS — R600 Localized edema: Secondary | ICD-10-CM | POA: Insufficient documentation

## 2024-07-24 DIAGNOSIS — R262 Difficulty in walking, not elsewhere classified: Secondary | ICD-10-CM | POA: Diagnosis present

## 2024-07-24 NOTE — Therapy (Signed)
 OUTPATIENT PHYSICAL THERAPY TREATMENT   Patient Name: Rhonda Porter MRN: [id number] DOB:[birth year], 53 y.o., female Today's Date: 07/24/2024  END OF SESSION:  PT End of Session - 07/24/24 0854     Visit Number 2    Number of Visits 15    Date for Recertification  09/15/24    Authorization Type Aetna    Authorization Time Period no auth needed    PT Start Time 0855    PT Stop Time 0935    PT Time Calculation (min) 40 min    Activity Tolerance Patient tolerated treatment well    Behavior During Therapy [facility] for tasks assessed/performed          Past Medical History:  Diagnosis Date   Diverticula of colon    Ganglion cyst    Gastritis    History of colonic polyps    Hypertension    on 03/08/15 pt denies high bp in over 10 yrs    IBS (irritable bowel syndrome)    Melanoma (HCC) 10/29/2004   seasonal allergies    Past Surgical History:  Procedure Laterality Date   ABDOMINAL HYSTERECTOMY  10/29/1997   CARPAL TUNNEL RELEASE Right    COLONOSCOPY  03/08/2015   FOOT SURGERY Bilateral    surg x 3 right, surg x 1 left   GLUTEUS MINIMUS REPAIR Left 07/30/2022   Procedure: LEFT GLUTEUS MEDIUS REPAIR;  Surgeon: Genelle Standing, MD;  Location: [facility] OR;  Service: Orthopedics;  Laterality: Left;   KNEE ARTHROSCOPY Right 10/30/2011   MELANOMA EXCISION  10/29/2004   left shoulder   SPINAL FUSION     UPPER GASTROINTESTINAL ENDOSCOPY  03/08/2015   Dr.Pyrtle   WISDOM TOOTH EXTRACTION     Patient Active Problem List   Diagnosis Date Noted   Tear of left gluteus medius tendon    Lower abdominal pain 04/11/2017   Menopausal symptom 04/11/2017   Pain in pelvis 04/11/2017   LUQ abdominal pain 02/10/2015   Bloating 02/08/2015   Irritable bowel syndrome (IBS) 02/08/2015   Abdominal cramps 02/08/2015   DIVERTICULAR BLEEDING, HX OF 01/18/2009   HYPERTENSION 01/17/2009   History of colonic polyps 01/17/2009    PCP: Dow Longs, PA-C   REFERRING PROVIDER: Genelle Standing,  MD   REFERRING DIAG: [phone] (ICD-10-CM) - Tear of left acetabular labrum, initial encounter   Rationale for Evaluation and Treatment:  Rehabiliation  THERAPY DIAG:  Pain in left hip  Muscle weakness (generalized)  Difficulty walking  Localized edema  ONSET DATE: post op hip labrum repair on left 07/16/24 *see protocol under media or on Gabriele Loveland L's desk   SUBJECTIVE:  SUBJECTIVE STATEMENT: Feeling ok today; just sore.  2/10 soreness today; returns to MD next week on Thursday Oct 1st to likely get sutures out   EVAL:She had hip labral repair. Since she is now doing okay, WBAT. She did take meds before coming.   PERTINENT HISTORY:  See above PMH Had a left glute med repair 10/23 per [facility]  PAIN:  NPRS scale: 3/10 upon arrival Pain location:left hip around incision Pain description: constant sore Aggravating factors: standing too long Relieving factors: rest, meds, ice   PRECAUTIONS: ,  Hip post op protocol  RED FLAGS: None   WEIGHT BEARING RESTRICTIONS:  WBAT  FALLS:  Has patient fallen in last 6 months? 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  PATIENT SURVEYS:  Patient-Specific Activity Scoring Scheme  0 represents "unable to perform." 10 represents "able to perform at prior level. 0 1 2 3 4 5 6 7 8 9  10 (Date and Score)   Activity Eval     1. walking 3    2. Getting in/out of bed  6    3.     4.    5.    Score 4.5    Total score = sum of the activity scores/number of activities Minimum detectable change (90%CI) for average score = 2 points Minimum detectable change (90%CI) for single activity  score = 3 points     EDEMA:  Yes: in left hip  GAIT: Eval: Assistive device utilized: None Level of assistance: Complete Independence Comments: slow ambulation, shorter steps, antalgic gait, limited distances    LOWER EXTREMITY ROM:     Active /PROM Left eval   Hip flexion 70/90   Hip extension    Hip abduction 20/30   Hip adduction    Hip internal rotation /30   Hip external rotation /30   Knee flexion    Knee extension    Ankle dorsiflexion    Ankle plantarflexion    Ankle inversion    Ankle eversion     (Blank rows = not tested)   LOWER EXTREMITY MMT:    MMT Left Eval   Hip flexion 2   Hip extension    Hip abduction 2   Hip adduction    Hip internal rotation 2   Hip external rotation 2   Knee flexion 3   Knee extension 3   Ankle dorsiflexion    Ankle plantarflexion    Ankle inversion    Ankle eversion     (Blank rows = not tested)    FUNCTIONAL TESTS:  Eval: 5 times sit to stand: 47 seconds                                                                                                                              TREATMENT  DATE:  07/24/24 Review of HEP and goals Review of protocol and motions to avoid; not past 90 flexion; no extension, abduction not past 30 degrees Standing: Marching left hip x 8 Hip abduction x 8 Heel slides with strap x 10 Abdominal bracing 5 x 10 Abdominal bracing with bent knee fallouts x 10 Abdominal bracing with hip adduction 5 x 10     Eval HEP creation and review with demonstration and trial set preformed, see below for details Nu step L1 X 5 min UE/LE seat #5 Self care: discussed post op precautions, not to push through too much pain, PT plan of care and healing times    PATIENT EDUCATION: Education details: HEP, PT plan of care, selfcare Person educated: Patient Education method: Explanation, Demonstration, Verbal cues, and Handouts Education comprehension: verbalized understanding, further education  recommended   HOME EXERCISE PROGRAM: Access Code: [email or web address] URL: [email or web address] Date: 07/21/2024 Prepared by: Redell Moose  Exercises - Supine Heel Slide with Strap  - 2 x daily - 6 x weekly - 1 sets - 10 reps - Bent Knee Fallouts  - 2 x daily - 6 x weekly - 1 sets - 10 reps - Seated Hip Adduction Isometrics with Ball  - 2 x daily - 6 x weekly - 1 sets - 10 reps - 5 sec hold - Standing March with Counter Support  - 2 x daily - 6 x weekly - 1 sets - 10 reps - Standing Hip Abduction with Counter Support  - 2 x daily - 6 x weekly - 1 sets - 10 reps  ASSESSMENT:  CLINICAL IMPRESSION: Today's session started with a review of HEP and goals; continued with exercises per protocol; added abdominal bracing and updated HEP.  Bandage still in place.  Reports tenderness left ischial tuberosity.  Continues with antalgic gait; slow gait speed.  Patient will benefit from continued skilled therapy services to address deficits and promote return to optimal function.       Eval: Patient referred to PT post op hip labrum repair on left 07/16/24. Overall doing well up to this point and she is WBAT. I reviewed post op precautions with her today and she will follow back up with surgeon next week. Patient will benefit from skilled PT to improve overall function and to address impairments and limitations listed below.  OBJECTIVE IMPAIRMENTS: decreased activity tolerance for ADL's, difficulty walking, decreased balance, decreased endurance, decreased mobility, decreased ROM, decreased strength, impaired flexibility, impaired LE use, and pain.  ACTIVITY LIMITATIONS: bending, liftting, walking, standing, cleaning, community activity, driving,   PERSONAL FACTORS: see above PMH  also affecting patient's functional outcome.  REHAB POTENTIAL: Good  CLINICAL DECISION MAKING: Stable/uncomplicated  EVALUATION COMPLEXITY: Low    GOALS: Short term PT Goals Target date: 08/18/2024   Pt will  be I and compliant with HEP. Baseline:  Goal status: in progress  2. Pt will improve 5 times sit to stand test to less than 25 seconds  Baseline 47 sec  Goal status:in progress Long term PT goals Target date:09/15/2024   Pt will improve left hip AROM to [facility] to improve functional mobility Baseline: Goal status: in progress Pt will improve left LE  strength to at least 4+/5 MMT to improve functional strength Baseline: Goal status: in progress Pt will improve Patient specific functional scale (PSFS) to at least 7/10 to show improved function level Baseline: Goal status: in progress Pt will reduce pain to overall less than 3/10 with usual activity and with ambulating community distances  Baseline: Goal status: in progress Pt will be improve 5 times sit to stand test to less than 13 seconds Baseline:47 sec Goal status: in progress  PLAN: PT FREQUENCY: 1-2 times per week   PT DURATION: 6-8 weeks  PLANNED INTERVENTIONS  97110-Therapeutic exercises, 97530- Therapeutic activity, [id number]- Neuromuscular re-education, 97535- Self Care, [id number]- Manual therapy, and Patient/Family education  PLAN FOR NEXT SESSION: beginner hip/glute activation and ROM within protocol parameters NEXT MD VISIT: 07/30/24  10:01 AM, 07/24/24 Choya Tornow Small Darrold Bezek MPT [facility] [place] [phone] Ey:[phone]

## 2024-07-28 ENCOUNTER — Ambulatory Visit (HOSPITAL_COMMUNITY)

## 2024-07-28 ENCOUNTER — Ambulatory Visit: Admitting: Physical Therapy

## 2024-07-28 ENCOUNTER — Encounter (HOSPITAL_COMMUNITY): Payer: Self-pay

## 2024-07-28 DIAGNOSIS — M6281 Muscle weakness (generalized): Secondary | ICD-10-CM

## 2024-07-28 DIAGNOSIS — R600 Localized edema: Secondary | ICD-10-CM

## 2024-07-28 DIAGNOSIS — M25552 Pain in left hip: Secondary | ICD-10-CM | POA: Diagnosis not present

## 2024-07-28 DIAGNOSIS — R262 Difficulty in walking, not elsewhere classified: Secondary | ICD-10-CM

## 2024-07-28 NOTE — Therapy (Signed)
 OUTPATIENT PHYSICAL THERAPY TREATMENT   Patient Name: Rhonda Porter MRN: [id number] DOB:[birth year], 53 y.o., female Today's Date: 07/28/2024  END OF SESSION:  PT End of Session - 07/28/24 1503     Visit Number 3    Number of Visits 15    Date for Recertification  09/15/24    Authorization Type Aetna    Authorization Time Period no auth needed    PT Start Time 1503    PT Stop Time 1542    PT Time Calculation (min) 39 min    Activity Tolerance Patient tolerated treatment well    Behavior During Therapy [facility] for tasks assessed/performed           Past Medical History:  Diagnosis Date   Diverticula of colon    Ganglion cyst    Gastritis    History of colonic polyps    Hypertension    on 03/08/15 pt denies high bp in over 10 yrs    IBS (irritable bowel syndrome)    Melanoma (HCC) 10/29/2004   seasonal allergies    Past Surgical History:  Procedure Laterality Date   ABDOMINAL HYSTERECTOMY  10/29/1997   CARPAL TUNNEL RELEASE Right    COLONOSCOPY  03/08/2015   FOOT SURGERY Bilateral    surg x 3 right, surg x 1 left   GLUTEUS MINIMUS REPAIR Left 07/30/2022   Procedure: LEFT GLUTEUS MEDIUS REPAIR;  Surgeon: Genelle Standing, MD;  Location: [facility] OR;  Service: Orthopedics;  Laterality: Left;   KNEE ARTHROSCOPY Right 10/30/2011   MELANOMA EXCISION  10/29/2004   left shoulder   SPINAL FUSION     UPPER GASTROINTESTINAL ENDOSCOPY  03/08/2015   Dr.Pyrtle   WISDOM TOOTH EXTRACTION     Patient Active Problem List   Diagnosis Date Noted   Tear of left gluteus medius tendon    Lower abdominal pain 04/11/2017   Menopausal symptom 04/11/2017   Pain in pelvis 04/11/2017   LUQ abdominal pain 02/10/2015   Bloating 02/08/2015   Irritable bowel syndrome (IBS) 02/08/2015   Abdominal cramps 02/08/2015   DIVERTICULAR BLEEDING, HX OF 01/18/2009   HYPERTENSION 01/17/2009   History of colonic polyps 01/17/2009    PCP: Dow Longs, PA-C   REFERRING PROVIDER: Genelle Standing,  MD   REFERRING DIAG: [phone] (ICD-10-CM) - Tear of left acetabular labrum, initial encounter   Rationale for Evaluation and Treatment:  Rehabiliation  THERAPY DIAG:  Pain in left hip  Muscle weakness (generalized)  Difficulty walking  Localized edema  ONSET DATE: post op hip labrum repair on left 07/16/24 *see protocol under media or on Amy L's desk   SUBJECTIVE:  SUBJECTIVE STATEMENT: Pt states she is in about 1/10 pain. Pt states she sleeps on right side, wakes up at night due to left groin pain. Pt states if she is up too much her body will tell her. Pt also reports dealing with increased soreness of left glute region during prolonged sitting.   EVAL:She had hip labral repair. Since she is now doing okay, WBAT. She did take meds before coming.   PERTINENT HISTORY:  See above PMH Had a left glute med repair 10/23 per [facility]  PAIN:  NPRS scale: 3/10 upon arrival Pain location:left hip around incision Pain description: constant sore Aggravating factors: standing too long Relieving factors: rest, meds, ice   PRECAUTIONS: ,  Hip post op protocol  RED FLAGS: None   WEIGHT BEARING RESTRICTIONS:  WBAT  FALLS:  Has patient fallen in last 6 months? 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  PATIENT SURVEYS:  Patient-Specific Activity Scoring Scheme  0 represents "unable to perform." 10 represents "able to perform at prior level. 0 1 2 3 4 5 6 7 8 9  10 (Date and Score)   Activity Eval     1. walking 3    2. Getting in/out of bed  6    3.     4.    5.    Score 4.5    Total score = sum of the activity  scores/number of activities Minimum detectable change (90%CI) for average score = 2 points Minimum detectable change (90%CI) for single activity score = 3 points     EDEMA:  Yes: in left hip  GAIT: Eval: Assistive device utilized: None Level of assistance: Complete Independence Comments: slow ambulation, shorter steps, antalgic gait, limited distances    LOWER EXTREMITY ROM:     Active /PROM Left eval   Hip flexion 70/90   Hip extension    Hip abduction 20/30   Hip adduction    Hip internal rotation /30   Hip external rotation /30   Knee flexion    Knee extension    Ankle dorsiflexion    Ankle plantarflexion    Ankle inversion    Ankle eversion     (Blank rows = not tested)   LOWER EXTREMITY MMT:    MMT Left Eval   Hip flexion 2   Hip extension    Hip abduction 2   Hip adduction    Hip internal rotation 2   Hip external rotation 2   Knee flexion 3   Knee extension 3   Ankle dorsiflexion    Ankle plantarflexion    Ankle inversion    Ankle eversion     (Blank rows = not tested)    FUNCTIONAL TESTS:  Eval: 5 times sit to stand: 47 seconds  TREATMENT DATE:  07/28/2024  Manual Therapy: -PROM of L hip, all planes except adduction and remained within limits of protocol Therapeutic Exercise: -Standing heel raises, 1 set of 10 reps, pt cued for increased ROM -Hip abduction/adduction isometrics, 1 sets of 10 reps, 5 second holds, pt cued for region of activation (belt and ball utilized) -Glute sets, 2 sets of 10 reps, 3 second holds, pt cued for increased glute activation -Standing hip abductions, 1 set of 5 reps, pt cued for less than 30 degrees of hip abduction -Standing marches, 1 set of 10 reps bilaterally, pt cued for less than 90 degrees of hip flexion -Standing weight shifts to SLS on LLE, 1 set of 10 reps, 3 second  holds   07/24/24 Review of HEP and goals Review of protocol and motions to avoid; not past 90 flexion; no extension, abduction not past 30 degrees Standing: Marching left hip x 8 Hip abduction x 8 Heel slides with strap x 10 Abdominal bracing 5 x 10 Abdominal bracing with bent knee fallouts x 10 Abdominal bracing with hip adduction 5 x 10     Eval HEP creation and review with demonstration and trial set preformed, see below for details Nu step L1 X 5 min UE/LE seat #5 Self care: discussed post op precautions, not to push through too much pain, PT plan of care and healing times    PATIENT EDUCATION: Education details: HEP, PT plan of care, selfcare Person educated: Patient Education method: Explanation, Demonstration, Verbal cues, and Handouts Education comprehension: verbalized understanding, further education recommended   HOME EXERCISE PROGRAM: Access Code: [email or web address] URL: [email or web address] Date: 07/21/2024 Prepared by: Redell Moose  Exercises - Supine Heel Slide with Strap  - 2 x daily - 6 x weekly - 1 sets - 10 reps - Bent Knee Fallouts  - 2 x daily - 6 x weekly - 1 sets - 10 reps - Seated Hip Adduction Isometrics with Ball  - 2 x daily - 6 x weekly - 1 sets - 10 reps - 5 sec hold - Standing March with Counter Support  - 2 x daily - 6 x weekly - 1 sets - 10 reps - Standing Hip Abduction with Counter Support  - 2 x daily - 6 x weekly - 1 sets - 10 reps  ASSESSMENT:  CLINICAL IMPRESSION: Patient continues to demonstrate decreased LLE strength, decreased gait quality and balance. Patient also demonstrates slight increase in pain with hip abduction during PROM manual when taken to 30 degrees, all AROM abduction remained less than 30 degrees. Patient able to progress dynamic balance and L hip activation exercises today with standing weight shifts and ankle strengthening exercises, good performance with verbal cueing. Patient would continue to benefit  from skilled physical therapy for increased endurance with ambulation, increased LE strength/ROM, and improved balance for improved quality of life, improved gait pattern and continued progress towards therapy goals.     Eval: Patient referred to PT post op hip labrum repair on left 07/16/24. Overall doing well up to this point and she is WBAT. I reviewed post op precautions with her today and she will follow back up with surgeon next week. Patient will benefit from skilled PT to improve overall function and to address impairments and limitations listed below.  OBJECTIVE IMPAIRMENTS: decreased activity tolerance for ADL's, difficulty walking, decreased balance, decreased endurance, decreased mobility, decreased ROM, decreased strength, impaired flexibility, impaired LE use, and pain.  ACTIVITY LIMITATIONS: bending, liftting, walking, standing, cleaning, community activity, driving,  PERSONAL FACTORS: see above PMH  also affecting patient's functional outcome.  REHAB POTENTIAL: Good  CLINICAL DECISION MAKING: Stable/uncomplicated  EVALUATION COMPLEXITY: Low    GOALS: Short term PT Goals Target date: 08/18/2024   Pt will be I and compliant with HEP. Baseline:  Goal status: in progress  2. Pt will improve 5 times sit to stand test to less than 25 seconds  Baseline 47 sec  Goal status:in progress Long term PT goals Target date:09/15/2024   Pt will improve left hip AROM to [facility] to improve functional mobility Baseline: Goal status: in progress Pt will improve left LE  strength to at least 4+/5 MMT to improve functional strength Baseline: Goal status: in progress Pt will improve Patient specific functional scale (PSFS) to at least 7/10 to show improved function level Baseline: Goal status: in progress Pt will reduce pain to overall less than 3/10 with usual activity and with ambulating community distances Baseline: Goal status: in progress Pt will be improve 5 times sit to stand  test to less than 13 seconds Baseline:47 sec Goal status: in progress  PLAN: PT FREQUENCY: 1-2 times per week   PT DURATION: 6-8 weeks  PLANNED INTERVENTIONS  97110-Therapeutic exercises, 97530- Therapeutic activity, 97112- Neuromuscular re-education, 97535- Self Care, 02859- Manual therapy, and Patient/Family education  PLAN FOR NEXT SESSION: beginner hip/glute activation and ROM within protocol parameters NEXT MD VISIT: 07/30/24  Lang Ada, PT, DPT [facility] Office: [phone] 5:57 PM, 07/28/24

## 2024-07-30 ENCOUNTER — Ambulatory Visit (HOSPITAL_COMMUNITY): Attending: Orthopaedic Surgery

## 2024-07-30 ENCOUNTER — Ambulatory Visit (INDEPENDENT_AMBULATORY_CARE_PROVIDER_SITE_OTHER): Admitting: Orthopaedic Surgery

## 2024-07-30 DIAGNOSIS — M6281 Muscle weakness (generalized): Secondary | ICD-10-CM | POA: Insufficient documentation

## 2024-07-30 DIAGNOSIS — M25552 Pain in left hip: Secondary | ICD-10-CM | POA: Insufficient documentation

## 2024-07-30 DIAGNOSIS — R262 Difficulty in walking, not elsewhere classified: Secondary | ICD-10-CM | POA: Insufficient documentation

## 2024-07-30 DIAGNOSIS — S73192A Other sprain of left hip, initial encounter: Secondary | ICD-10-CM

## 2024-07-30 DIAGNOSIS — R600 Localized edema: Secondary | ICD-10-CM | POA: Insufficient documentation

## 2024-07-30 NOTE — Progress Notes (Signed)
 Post Operative Evaluation    Procedure/Date of Surgery: Left hip labral repair 9/18  Interval History:   Presents today 2 weeks status post the above procedure.  Overall she is continuing to improve.  She has been on physical therapy.  PMH/PSH/Family History/Social History/Meds/Allergies:    Past Medical History:  Diagnosis Date   Diverticula of colon    Ganglion cyst    Gastritis    History of colonic polyps    Hypertension    on 03/08/15 pt denies high bp in over 10 yrs    IBS (irritable bowel syndrome)    Melanoma (HCC) 10/29/2004   seasonal allergies    Past Surgical History:  Procedure Laterality Date   ABDOMINAL HYSTERECTOMY  10/29/1997   CARPAL TUNNEL RELEASE Right    COLONOSCOPY  03/08/2015   FOOT SURGERY Bilateral    surg x 3 right, surg x 1 left   GLUTEUS MINIMUS REPAIR Left 07/30/2022   Procedure: LEFT GLUTEUS MEDIUS REPAIR;  Surgeon: Genelle Standing, MD;  Location: [facility] OR;  Service: Orthopedics;  Laterality: Left;   KNEE ARTHROSCOPY Right 10/30/2011   MELANOMA EXCISION  10/29/2004   left shoulder   SPINAL FUSION     UPPER GASTROINTESTINAL ENDOSCOPY  03/08/2015   Dr.Pyrtle   WISDOM TOOTH EXTRACTION     Social History   Socioeconomic History   Marital status: Married    Spouse name: Not on file   Number of children: 2   Years of education: Not on file   Highest education level: Not on file  Occupational History   Occupation: Radio broadcast assistant: [employer]  Tobacco Use   Smoking status: Every Day    Current packs/day: 1.00    Average packs/day: 1 pack/day for 34.0 years (34.0 ttl pk-yrs)    Types: Cigarettes   Smokeless tobacco: Never   Tobacco comments:    Quit at age 33yr  Vaping Use   Vaping status: Never Used  Substance and Sexual Activity   Alcohol use: Not Currently    Comment: once a month per pt   Drug use: No   Sexual activity: Yes    Birth control/protection: Surgical    Comment:  Hysterectomy  Other Topics Concern   Not on file  Social History Narrative   Not on file   Social Drivers of Health   Financial Resource Strain: Not on file  Food Insecurity: Not on file  Transportation Needs: Not on file  Physical Activity: Not on file  Stress: Not on file  Social Connections: Not on file   Family History  Problem Relation Age of Onset   Colon polyps Mother    Hypertension Mother    Leukemia Maternal Grandmother    Lymphoma Maternal Grandmother    Colon cancer Neg Hx    Esophageal cancer Neg Hx    Rectal cancer Neg Hx    Stomach cancer Neg Hx    Allergies  Allergen Reactions   Cetirizine Hcl Other (See Comments)    Not effective   Hydrocodone Itching    Have hypersensitivity to most pain meds   Other    Cephalexin Rash   Penicillins Rash   Sulfa Antibiotics Rash   Current Outpatient Medications  Medication Sig Dispense Refill   ondansetron  (ZOFRAN ) 4 MG tablet Take  1 tablet (4 mg total) by mouth daily as needed for nausea or vomiting. 30 tablet 1   Ascorbic Acid (VITAMIN C PO) Take 500 mg by mouth daily.     Cholecalciferol (VITAMIN D-3) 125 MCG (5000 UT) TABS Take by mouth.     dicyclomine  (BENTYL ) 20 MG tablet Take 1 tablet (20 mg total) by mouth daily as needed for spasms. 90 tablet 3   ELDERBERRY PO Take 50 mg by mouth daily. Gummy     famotidine  (PEPCID ) 20 MG tablet Take 1 tablet (20 mg total) by mouth at bedtime as needed for heartburn or indigestion. 90 tablet 3   fluticasone (FLONASE) 50 MCG/ACT nasal spray Place 1 spray into both nostrils daily as needed for allergies.     HYDROmorphone  (DILAUDID ) 2 MG tablet Take 1 tablet (2 mg total) by mouth every 4 (four) hours as needed for severe pain (pain score 7-10). 20 tablet 0   levocetirizine (XYZAL) 5 MG tablet Take 5 mg by mouth every evening.     losartan-hydrochlorothiazide (HYZAAR) 50-12.5 MG tablet Take 1 tablet by mouth daily.     pantoprazole  (PROTONIX ) 40 MG tablet TAKE ONE TABLET BY  MOUTH EVERY DAY 90 tablet 1   saccharomyces boulardii (FLORASTOR) 250 MG capsule Take 500 mg by mouth daily.     vitamin E 180 MG (400 UNITS) capsule Take 400 Units by mouth daily.     No current facility-administered medications for this visit.   No results found.  Review of Systems:   A ROS was performed including pertinent positives and negatives as documented in the HPI.   Musculoskeletal Exam:    There were no vitals taken for this visit.  Left hip is well-healed.  Sensation is intact in all distributions.  30 degrees internal/external rotation without pain.  Distal neurosensory exam is intact Imaging:     I personally reviewed and interpreted the radiographs.   Assessment:   2 weeks status post left hip labral repair doing very well.  This time she continue to progress with range of motion and strengthening I will plan to see her back in 4 weeks for reassessment Plan :    - Return to clinic 4 weeks for reassessment   I personally saw and evaluated the patient, and participated in the management and treatment plan.  Elspeth Parker, MD Attending Physician, Orthopedic Surgery  This document was dictated using Dragon voice recognition software. A reasonable attempt at proof reading has been made to minimize errors.

## 2024-07-30 NOTE — Therapy (Signed)
 OUTPATIENT PHYSICAL THERAPY TREATMENT   Patient Name: Rhonda Porter MRN: [id number] DOB:[birth year], 53 y.o., female Today's Date: 07/30/2024  END OF SESSION:  PT End of Session - 07/30/24 1753     Visit Number 4    Number of Visits 15    Date for Recertification  09/15/24    Authorization Type Aetna    PT Start Time 1600    PT Stop Time 1645    PT Time Calculation (min) 45 min    Activity Tolerance Patient tolerated treatment well    Behavior During Therapy [facility] for tasks assessed/performed            Past Medical History:  Diagnosis Date   Diverticula of colon    Ganglion cyst    Gastritis    History of colonic polyps    Hypertension    on 03/08/15 pt denies high bp in over 10 yrs    IBS (irritable bowel syndrome)    Melanoma (HCC) 10/29/2004   seasonal allergies    Past Surgical History:  Procedure Laterality Date   ABDOMINAL HYSTERECTOMY  10/29/1997   CARPAL TUNNEL RELEASE Right    COLONOSCOPY  03/08/2015   FOOT SURGERY Bilateral    surg x 3 right, surg x 1 left   GLUTEUS MINIMUS REPAIR Left 07/30/2022   Procedure: LEFT GLUTEUS MEDIUS REPAIR;  Surgeon: Genelle Standing, MD;  Location: [facility] OR;  Service: Orthopedics;  Laterality: Left;   KNEE ARTHROSCOPY Right 10/30/2011   MELANOMA EXCISION  10/29/2004   left shoulder   SPINAL FUSION     UPPER GASTROINTESTINAL ENDOSCOPY  03/08/2015   Dr.Pyrtle   WISDOM TOOTH EXTRACTION     Patient Active Problem List   Diagnosis Date Noted   Tear of left gluteus medius tendon    Lower abdominal pain 04/11/2017   Menopausal symptom 04/11/2017   Pain in pelvis 04/11/2017   LUQ abdominal pain 02/10/2015   Bloating 02/08/2015   Irritable bowel syndrome (IBS) 02/08/2015   Abdominal cramps 02/08/2015   DIVERTICULAR BLEEDING, HX OF 01/18/2009   HYPERTENSION 01/17/2009   History of colonic polyps 01/17/2009    PCP: Dow Longs, PA-C   REFERRING PROVIDER: Genelle Standing, MD   REFERRING DIAG: [phone] (ICD-10-CM) - Tear  of left acetabular labrum, initial encounter   Rationale for Evaluation and Treatment:  Rehabiliation  THERAPY DIAG:  Pain in left hip  Muscle weakness (generalized)  Difficulty walking  ONSET DATE: post op hip labrum repair on left 07/16/24 *see protocol under media or on Amy L's desk   SUBJECTIVE:  SUBJECTIVE STATEMENT: Pt went to MD today and they said she is doing well and that the protocol in online on the MD website for her surgery. Pt reports she was a little sore in her groin after the last visit but it was an ache and nothing sharp.    EVAL:She had hip labral repair. Since she is now doing okay, WBAT. She did take meds before coming.   PERTINENT HISTORY:  See above PMH Had a left glute med repair 10/23 per [facility]  PAIN:  NPRS scale: 3/10 upon arrival Pain location:left hip around incision Pain description: constant sore Aggravating factors: standing too long Relieving factors: rest, meds, ice   PRECAUTIONS: ,  Hip post op protocol- [email or web address]   RED FLAGS: None   WEIGHT BEARING RESTRICTIONS:  WBAT  FALLS:  Has patient fallen in last 6 months? 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  PATIENT SURVEYS:  Patient-Specific Activity Scoring Scheme  0 represents "unable to perform." 10 represents "able to perform at prior level. 0 1 2 3 4 5 6 7 8 9  10 (Date and Score)   Activity Eval     1. walking 3    2. Getting in/out of bed   6    3.     4.    5.    Score 4.5    Total score = sum of the activity scores/number of activities Minimum detectable change (90%CI) for average score = 2 points Minimum detectable change (90%CI) for single activity score = 3 points     EDEMA:  Yes: in left hip  GAIT: Eval: Assistive device utilized: None Level of assistance: Complete Independence Comments: slow ambulation, shorter steps, antalgic gait, limited distances    LOWER EXTREMITY ROM:     Active /PROM Left eval   Hip flexion 70/90   Hip extension    Hip abduction 20/30   Hip adduction    Hip internal rotation /30   Hip external rotation /30   Knee flexion    Knee extension    Ankle dorsiflexion    Ankle plantarflexion    Ankle inversion    Ankle eversion     (Blank rows = not tested)   LOWER EXTREMITY MMT:    MMT Left Eval   Hip flexion 2   Hip extension    Hip abduction 2   Hip adduction    Hip internal rotation 2   Hip external rotation 2   Knee flexion 3   Knee extension 3   Ankle dorsiflexion    Ankle plantarflexion    Ankle inversion    Ankle eversion     (Blank rows = not tested)    FUNCTIONAL TESTS:  Eval: 5 times sit to stand: 47 seconds  TREATMENT DATE:  07/30/24 Manual Therapy: -PROM of L hip, all planes except adduction and remained within limits of protocol Therapeutic Exercise: -Quadruped rock backs - maintaining protocol ROM limitations - Sit to stand with cues for appropriate form and within protocol limits - Side stepping (no resistance) - Backward walking (no resistance and within pain free) - Step up w/ R LE and down with L LE  07/28/2024  Manual Therapy: -PROM of L hip, all planes except adduction and remained within limits of protocol Therapeutic Exercise: -Standing heel raises, 1 set of 10 reps, pt cued for increased ROM -Hip  abduction/adduction isometrics, 1 sets of 10 reps, 5 second holds, pt cued for region of activation (belt and ball utilized) -Glute sets, 2 sets of 10 reps, 3 second holds, pt cued for increased glute activation -Standing hip abductions, 1 set of 5 reps, pt cued for less than 30 degrees of hip abduction -Standing marches, 1 set of 10 reps bilaterally, pt cued for less than 90 degrees of hip flexion -Standing weight shifts to SLS on LLE, 1 set of 10 reps, 3 second holds   07/24/24 Review of HEP and goals Review of protocol and motions to avoid; not past 90 flexion; no extension, abduction not past 30 degrees Standing: Marching left hip x 8 Hip abduction x 8 Heel slides with strap x 10 Abdominal bracing 5 x 10 Abdominal bracing with bent knee fallouts x 10 Abdominal bracing with hip adduction 5 x 10  Eval HEP creation and review with demonstration and trial set preformed, see below for details Nu step L1 X 5 min UE/LE seat #5 Self care: discussed post op precautions, not to push through too much pain, PT plan of care and healing times   PATIENT EDUCATION: Education details: HEP, PT plan of care, selfcare Person educated: Patient Education method: Explanation, Demonstration, Verbal cues, and Handouts Education comprehension: verbalized understanding, further education recommended   HOME EXERCISE PROGRAM: Access Code: [email or web address] URL: [email or web address] Date: 07/21/2024 Prepared by: Redell Moose  Exercises - Supine Heel Slide with Strap  - 2 x daily - 6 x weekly - 1 sets - 10 reps - Bent Knee Fallouts  - 2 x daily - 6 x weekly - 1 sets - 10 reps - Seated Hip Adduction Isometrics with Ball  - 2 x daily - 6 x weekly - 1 sets - 10 reps - 5 sec hold - Standing March with Counter Support  - 2 x daily - 6 x weekly - 1 sets - 10 reps - Standing Hip Abduction with Counter Support  - 2 x daily - 6 x weekly - 1 sets - 10 reps  ASSESSMENT:  CLINICAL IMPRESSION: Patient  tolerated all the interventions with no increase pain however fatigue noted and held off on recumbent bike at end secondary to fatigue and slight soreness noted. Iced and educated on ice at end of session. Patient able to progress L hip activation exercises today with standing weight shifts with side stepping and good performance with verbal cueing. Patient would continue to benefit from skilled physical therapy for increased endurance with ambulation, increased LE strength/ROM, and improved balance for improved quality of life, improved gait pattern and continued progress towards therapy goals.  Eval: Patient referred to PT post op hip labrum repair on left 07/16/24. Overall doing well up to this point and she is WBAT. I reviewed post op precautions with her today and she will follow back up with surgeon next week. Patient will benefit  from skilled PT to improve overall function and to address impairments and limitations listed below.  OBJECTIVE IMPAIRMENTS: decreased activity tolerance for ADL's, difficulty walking, decreased balance, decreased endurance, decreased mobility, decreased ROM, decreased strength, impaired flexibility, impaired LE use, and pain.  ACTIVITY LIMITATIONS: bending, liftting, walking, standing, cleaning, community activity, driving,   PERSONAL FACTORS: see above PMH  also affecting patient's functional outcome.  REHAB POTENTIAL: Good  CLINICAL DECISION MAKING: Stable/uncomplicated  EVALUATION COMPLEXITY: Low    GOALS: Short term PT Goals Target date: 08/18/2024   Pt will be I and compliant with HEP. Baseline:  Goal status: in progress  2. Pt will improve 5 times sit to stand test to less than 25 seconds  Baseline 47 sec  Goal status:in progress Long term PT goals Target date:09/15/2024   Pt will improve left hip AROM to [facility] to improve functional mobility Baseline: Goal status: in progress Pt will improve left LE  strength to at least 4+/5 MMT to improve  functional strength Baseline: Goal status: in progress Pt will improve Patient specific functional scale (PSFS) to at least 7/10 to show improved function level Baseline: Goal status: in progress Pt will reduce pain to overall less than 3/10 with usual activity and with ambulating community distances Baseline: Goal status: in progress Pt will be improve 5 times sit to stand test to less than 13 seconds Baseline:47 sec Goal status: in progress  PLAN: PT FREQUENCY: 1-2 times per week   PT DURATION: 6-8 weeks  PLANNED INTERVENTIONS  97110-Therapeutic exercises, 97530- Therapeutic activity, [id number]- Neuromuscular re-education, 97535- Self Care, [id number]- Manual therapy, and Patient/Family education  PLAN FOR NEXT SESSION: beginner hip/glute activation and ROM within protocol parameters NEXT MD VISIT: 07/30/24  Lamarr LITTIE Citrin PT, DPT [facility] [place] [phone] office  5:56 PM, 07/30/24

## 2024-08-04 ENCOUNTER — Ambulatory Visit (HOSPITAL_COMMUNITY)

## 2024-08-04 ENCOUNTER — Encounter (HOSPITAL_COMMUNITY): Payer: Self-pay

## 2024-08-04 DIAGNOSIS — M25552 Pain in left hip: Secondary | ICD-10-CM

## 2024-08-04 DIAGNOSIS — R262 Difficulty in walking, not elsewhere classified: Secondary | ICD-10-CM

## 2024-08-04 DIAGNOSIS — M6281 Muscle weakness (generalized): Secondary | ICD-10-CM

## 2024-08-04 DIAGNOSIS — R600 Localized edema: Secondary | ICD-10-CM

## 2024-08-04 NOTE — Therapy (Signed)
 OUTPATIENT PHYSICAL THERAPY TREATMENT   Patient Name: Rhonda Porter MRN: [id number] DOB:02-22-1971, 53 y.o., female Today's Date: 08/04/2024  END OF SESSION:  PT End of Session - 08/04/24 0948     Visit Number 5    Number of Visits 15    Date for Recertification  09/15/24    Authorization Type Aetna    Authorization Time Period no auth needed    PT Start Time 0951    PT Stop Time 1033    PT Time Calculation (min) 42 min    Activity Tolerance Patient tolerated treatment well    Behavior During Therapy [facility] for tasks assessed/performed            Past Medical History:  Diagnosis Date   Diverticula of colon    Ganglion cyst    Gastritis    History of colonic polyps    Hypertension    on 03/08/15 pt denies high bp in over 10 yrs    IBS (irritable bowel syndrome)    Melanoma (HCC) 10/29/2004   seasonal allergies    Past Surgical History:  Procedure Laterality Date   ABDOMINAL HYSTERECTOMY  10/29/1997   CARPAL TUNNEL RELEASE Right    COLONOSCOPY  03/08/2015   FOOT SURGERY Bilateral    surg x 3 right, surg x 1 left   GLUTEUS MINIMUS REPAIR Left 07/30/2022   Procedure: LEFT GLUTEUS MEDIUS REPAIR;  Surgeon: Genelle Standing, MD;  Location: [facility] OR;  Service: Orthopedics;  Laterality: Left;   KNEE ARTHROSCOPY Right 10/30/2011   MELANOMA EXCISION  10/29/2004   left shoulder   SPINAL FUSION     UPPER GASTROINTESTINAL ENDOSCOPY  03/08/2015   Dr.Pyrtle   WISDOM TOOTH EXTRACTION     Patient Active Problem List   Diagnosis Date Noted   Tear of left gluteus medius tendon    Lower abdominal pain 04/11/2017   Menopausal symptom 04/11/2017   Pain in pelvis 04/11/2017   LUQ abdominal pain 02/10/2015   Bloating 02/08/2015   Irritable bowel syndrome (IBS) 02/08/2015   Abdominal cramps 02/08/2015   DIVERTICULAR BLEEDING, HX OF 01/18/2009   HYPERTENSION 01/17/2009   History of colonic polyps 01/17/2009    PCP: Dow Longs, PA-C   REFERRING PROVIDER: Genelle Standing,  MD   REFERRING DIAG: [phone] (ICD-10-CM) - Tear of left acetabular labrum, initial encounter   Rationale for Evaluation and Treatment:  Rehabiliation  THERAPY DIAG:  Pain in left hip  Muscle weakness (generalized)  Difficulty walking  Localized edema  ONSET DATE: post op hip labrum repair on left 07/16/24 *see protocol under media or on Amy L's desk   SUBJECTIVE:  SUBJECTIVE STATEMENT: Saw MD last Thursday, had stiches removed on Thursday and reports some itching and irritated skin around the incision, no reports heat or drainage.  Current pain scale 2/10.  Continues to have swelling  EVAL:She had hip labral repair. Since she is now doing okay, WBAT. She did take meds before coming.   PERTINENT HISTORY:  See above PMH Had a left glute med repair 10/23 per [facility]  PAIN:  NPRS scale: 2/10 upon arrival Pain location:left hip around incision Pain description: constant sore Aggravating factors: standing too long Relieving factors: rest, meds, ice   PRECAUTIONS: ,  Hip post op protocol- [email or web address]   RED FLAGS: None   WEIGHT BEARING RESTRICTIONS:  WBAT  FALLS:  Has patient fallen in last 6 months? 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  PATIENT SURVEYS:  Patient-Specific Activity Scoring Scheme  0 represents "unable to perform." 10 represents "able to perform at prior level. 0 1 2 3 4 5 6 7 8 9  10 (Date and Score)   Activity Eval      1. walking 3    2. Getting in/out of bed  6    3.     4.    5.    Score 4.5    Total score = sum of the activity scores/number of activities Minimum detectable change (90%CI) for average score = 2 points Minimum detectable change (90%CI) for single activity score = 3 points     EDEMA:  Yes: in left hip  GAIT: Eval: Assistive device utilized: None Level of assistance: Complete Independence Comments: slow ambulation, shorter steps, antalgic gait, limited distances    LOWER EXTREMITY ROM:     Active /PROM Left eval   Hip flexion 70/90   Hip extension    Hip abduction 20/30   Hip adduction    Hip internal rotation /30   Hip external rotation /30   Knee flexion    Knee extension    Ankle dorsiflexion    Ankle plantarflexion    Ankle inversion    Ankle eversion     (Blank rows = not tested)   LOWER EXTREMITY MMT:    MMT Left Eval   Hip flexion 2   Hip extension    Hip abduction 2   Hip adduction    Hip internal rotation 2   Hip external rotation 2   Knee flexion 3   Knee extension 3   Ankle dorsiflexion    Ankle plantarflexion    Ankle inversion    Ankle eversion     (Blank rows = not tested)    FUNCTIONAL TESTS:  Eval: 5 times sit to stand: 47 seconds  TREATMENT DATE:  08/04/24: Therapist viewed incision/rash with picture taken (check media)   Media Information  Document Information  Photos  Lt hip incision  08/04/2024 09:54  Attached To:  Outpatient Rehab on 08/04/24 with Renae Augustin PARAS, PTA  Source Information  Renae Augustin PARAS, PTA  [facility]   Discussed edema control strategies Educated s/s with blood clot Benefits of compression garments thigh high  ETI measurements and handout given Supine: Bridge 10x 2 sets 5 PROM of L hip, all planes except adduction and remained within limits of  protocol Log rolling Sidelying:  Clam for ER 10x 5 Clam for IR 10x 5 Retrograde massage with LE elevated   Upright bike x 4 min   07/30/24 Manual Therapy: -PROM of L hip, all planes except adduction and remained within limits of protocol Therapeutic Exercise: -Quadruped rock backs - maintaining protocol ROM limitations - Sit to stand with cues for appropriate form and within protocol limits - Side stepping (no resistance) - Backward walking (no resistance and within pain free) - Step up w/ R LE and down with L LE  07/28/2024  Manual Therapy: -PROM of L hip, all planes except adduction and remained within limits of protocol Therapeutic Exercise: -Standing heel raises, 1 set of 10 reps, pt cued for increased ROM -Hip abduction/adduction isometrics, 1 sets of 10 reps, 5 second holds, pt cued for region of activation (belt and ball utilized) -Glute sets, 2 sets of 10 reps, 3 second holds, pt cued for increased glute activation -Standing hip abductions, 1 set of 5 reps, pt cued for less than 30 degrees of hip abduction -Standing marches, 1 set of 10 reps bilaterally, pt cued for less than 90 degrees of hip flexion -Standing weight shifts to SLS on LLE, 1 set of 10 reps, 3 second holds   07/24/24 Review of HEP and goals Review of protocol and motions to avoid; not past 90 flexion; no extension, abduction not past 30 degrees Standing: Marching left hip x 8 Hip abduction x 8 Heel slides with strap x 10 Abdominal bracing 5 x 10 Abdominal bracing with bent knee fallouts x 10 Abdominal bracing with hip adduction 5 x 10  Eval HEP creation and review with demonstration and trial set preformed, see below for details Nu step L1 X 5 min UE/LE seat #5 Self care: discussed post op precautions, not to push through too much pain, PT plan of care and healing times   PATIENT EDUCATION: Education details: HEP, PT plan of care, selfcare Person educated: Patient Education method:  Explanation, Demonstration, Verbal cues, and Handouts Education comprehension: verbalized understanding, further education recommended   HOME EXERCISE PROGRAM: Access Code: [email or web address] URL: [email or web address] Date: 07/21/2024 Prepared by: Redell Moose  Exercises - Supine Heel Slide with Strap  - 2 x daily - 6 x weekly - 1 sets - 10 reps - Bent Knee Fallouts  - 2 x daily - 6 x weekly - 1 sets - 10 reps - Seated Hip Adduction Isometrics with Ball  - 2 x daily - 6 x weekly - 1 sets - 10 reps - 5 sec hold - Standing March with Counter Support  - 2 x daily - 6 x weekly - 1 sets - 10 reps - Standing Hip Abduction with Counter Support  - 2 x daily - 6 x weekly - 1 sets - 10 reps  ASSESSMENT:  CLINICAL IMPRESSION: Pt arrived with reports of irritation and itching around incision with stiches removed on Thursday, picture taken in  media.  Pt educated s/s to look for for infection control, presents with no heat or drainage.  Pt continues to be limited by edema proximal incision, educated this is normal symptom following surgery (pt at 3 weeks post-op).  Reviewed RICE techniques, ankle pumps and compression garments to assist with edema and this session manual decongestive techniques were complete.  Continued with PROM for hip mobility within protocol and began upright bike for mobility.  Added gluteal strengthening exercises to POC with good tolerance.  Pt did report some groin discomfort, monitored through session.    Eval: Patient referred to PT post op hip labrum repair on left 07/16/24. Overall doing well up to this point and she is WBAT. I reviewed post op precautions with her today and she will follow back up with surgeon next week. Patient will benefit from skilled PT to improve overall function and to address impairments and limitations listed below.  OBJECTIVE IMPAIRMENTS: decreased activity tolerance for ADL's, difficulty walking, decreased balance, decreased endurance, decreased  mobility, decreased ROM, decreased strength, impaired flexibility, impaired LE use, and pain.  ACTIVITY LIMITATIONS: bending, liftting, walking, standing, cleaning, community activity, driving,   PERSONAL FACTORS: see above PMH  also affecting patient's functional outcome.  REHAB POTENTIAL: Good  CLINICAL DECISION MAKING: Stable/uncomplicated  EVALUATION COMPLEXITY: Low    GOALS: Short term PT Goals Target date: 08/18/2024   Pt will be I and compliant with HEP. Baseline:  Goal status: in progress  2. Pt will improve 5 times sit to stand test to less than 25 seconds  Baseline 47 sec  Goal status:in progress Long term PT goals Target date:09/15/2024   Pt will improve left hip AROM to [facility] to improve functional mobility Baseline: Goal status: in progress Pt will improve left LE  strength to at least 4+/5 MMT to improve functional strength Baseline: Goal status: in progress Pt will improve Patient specific functional scale (PSFS) to at least 7/10 to show improved function level Baseline: Goal status: in progress Pt will reduce pain to overall less than 3/10 with usual activity and with ambulating community distances Baseline: Goal status: in progress Pt will be improve 5 times sit to stand test to less than 13 seconds Baseline:47 sec Goal status: in progress  PLAN: PT FREQUENCY: 1-2 times per week   PT DURATION: 6-8 weeks  PLANNED INTERVENTIONS  97110-Therapeutic exercises, 97530- Therapeutic activity, 97112- Neuromuscular re-education, 97535- Self Care, 02859- Manual therapy, and Patient/Family education  PLAN FOR NEXT SESSION: beginner hip/glute activation and ROM within protocol parameters NEXT MD VISIT:   Augustin Mclean, LPTA/CLT; [facility] [phone]   4:25 PM, 08/04/24

## 2024-08-06 ENCOUNTER — Encounter: Admitting: Physical Therapy

## 2024-08-07 ENCOUNTER — Encounter (HOSPITAL_COMMUNITY): Payer: Self-pay

## 2024-08-07 ENCOUNTER — Ambulatory Visit (HOSPITAL_COMMUNITY)

## 2024-08-07 DIAGNOSIS — M6281 Muscle weakness (generalized): Secondary | ICD-10-CM

## 2024-08-07 DIAGNOSIS — M25552 Pain in left hip: Secondary | ICD-10-CM | POA: Diagnosis not present

## 2024-08-07 DIAGNOSIS — R262 Difficulty in walking, not elsewhere classified: Secondary | ICD-10-CM

## 2024-08-07 NOTE — Therapy (Signed)
 OUTPATIENT PHYSICAL THERAPY TREATMENT   Patient Name: Rhonda Porter MRN: [id number] DOB:Dec 02, 1970, 53 y.o., female Today's Date: 08/07/2024  END OF SESSION:  PT End of Session - 08/07/24 1456     Visit Number 6    Number of Visits 15    Date for Recertification  09/15/24    Authorization Type [employer]    Authorization Time Period no auth needed    PT Start Time 1500    PT Stop Time 1544    PT Time Calculation (min) 44 min    Activity Tolerance Patient tolerated treatment well    Behavior During Therapy [facility] for tasks assessed/performed             Past Medical History:  Diagnosis Date   Diverticula of colon    Ganglion cyst    Gastritis    History of colonic polyps    Hypertension    on 03/08/15 pt denies high bp in over 10 yrs    IBS (irritable bowel syndrome)    Melanoma (HCC) 10/29/2004   seasonal allergies    Past Surgical History:  Procedure Laterality Date   ABDOMINAL HYSTERECTOMY  10/29/1997   CARPAL TUNNEL RELEASE Right    COLONOSCOPY  03/08/2015   FOOT SURGERY Bilateral    surg x 3 right, surg x 1 left   GLUTEUS MINIMUS REPAIR Left 07/30/2022   Procedure: LEFT GLUTEUS MEDIUS REPAIR;  Surgeon: Genelle Standing, MD;  Location: [facility] OR;  Service: Orthopedics;  Laterality: Left;   KNEE ARTHROSCOPY Right 10/30/2011   MELANOMA EXCISION  10/29/2004   left shoulder   SPINAL FUSION     UPPER GASTROINTESTINAL ENDOSCOPY  03/08/2015   Dr.Pyrtle   WISDOM TOOTH EXTRACTION     Patient Active Problem List   Diagnosis Date Noted   Tear of left gluteus medius tendon    Lower abdominal pain 04/11/2017   Menopausal symptom 04/11/2017   Pain in pelvis 04/11/2017   LUQ abdominal pain 02/10/2015   Bloating 02/08/2015   Irritable bowel syndrome (IBS) 02/08/2015   Abdominal cramps 02/08/2015   DIVERTICULAR BLEEDING, HX OF 01/18/2009   HYPERTENSION 01/17/2009   History of colonic polyps 01/17/2009    PCP: Dow Longs, PA-C   REFERRING PROVIDER: Genelle Standing,  MD   REFERRING DIAG: [phone] (ICD-10-CM) - Tear of left acetabular labrum, initial encounter   Rationale for Evaluation and Treatment:  Rehabiliation  THERAPY DIAG:  Pain in left hip  Muscle weakness (generalized)  Difficulty walking  ONSET DATE: post op hip labrum repair on left 07/16/24 *see protocol under media or on Amy L's desk   SUBJECTIVE:  SUBJECTIVE STATEMENT: Patient reports that her swelling is still therapy. Her pain is around a 4/10 right now, but it was up to 8/10 earlier.   EVAL:She had hip labral repair. Since she is now doing okay, WBAT. She did take meds before coming.   PERTINENT HISTORY:  See above PMH Had a left glute med repair 10/23 per [facility]  PAIN:  NPRS scale: 4/10 upon arrival Pain location:left hip around incision Pain description: constant sore Aggravating factors: standing too long Relieving factors: rest, meds, ice   PRECAUTIONS: ,  Hip post op protocol- [email or web address]   RED FLAGS: None   WEIGHT BEARING RESTRICTIONS:  WBAT  FALLS:  Has patient fallen in last 6 months? 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  PATIENT SURVEYS:  Patient-Specific Activity Scoring Scheme  0 represents "unable to perform." 10 represents "able to perform at prior level. 0 1 2 3 4 5 6 7 8 9  10 (Date and Score)   Activity Eval     1. walking 3    2. Getting in/out of bed  6    3.     4.    5.    Score 4.5    Total score  = sum of the activity scores/number of activities Minimum detectable change (90%CI) for average score = 2 points Minimum detectable change (90%CI) for single activity score = 3 points     EDEMA:  Yes: in left hip  GAIT: Eval: Assistive device utilized: None Level of assistance: Complete Independence Comments: slow ambulation, shorter steps, antalgic gait, limited distances    LOWER EXTREMITY ROM:     Active /PROM Left eval   Hip flexion 70/90   Hip extension    Hip abduction 20/30   Hip adduction    Hip internal rotation /30   Hip external rotation /30   Knee flexion    Knee extension    Ankle dorsiflexion    Ankle plantarflexion    Ankle inversion    Ankle eversion     (Blank rows = not tested)   LOWER EXTREMITY MMT:    MMT Left Eval   Hip flexion 2   Hip extension    Hip abduction 2   Hip adduction    Hip internal rotation 2   Hip external rotation 2   Knee flexion 3   Knee extension 3   Ankle dorsiflexion    Ankle plantarflexion    Ankle inversion    Ankle eversion     (Blank rows = not tested)    FUNCTIONAL TESTS:  Eval: 5 times sit to stand: 47 seconds  TREATMENT DATE:                                   08/07/24  EXERCISE LOG  Exercise Repetitions and Resistance Comments  Upright bike  4 minutes    Static stance on foam   2 minutes  Infrequent UE support   Bridge w/ march   10 reps    Supine hip ADD isometric  10 reps w/ 5 second hold    SLR   2 x 5 reps  LLE only; partial ROM  Manual therapy  STM to left piriformis  Retrograde massage to L hip and thigh For reduced pain and swelling   Blank cell = exercise not performed today   08/04/24: Therapist viewed incision/rash with picture taken (check media)   Media Information  Document Information  Photos  Lt hip incision  08/04/2024 09:54  Attached To:   Outpatient Rehab on 08/04/24 with Renae Augustin PARAS, PTA  Source Information  Renae Augustin PARAS, PTA  [facility]   Discussed edema control strategies Educated s/s with blood clot Benefits of compression garments thigh high  ETI measurements and handout given Supine: Bridge 10x 2 sets 5 PROM of L hip, all planes except adduction and remained within limits of protocol Log rolling Sidelying:  Clam for ER 10x 5 Clam for IR 10x 5 Retrograde massage with LE elevated   Upright bike x 4 min   07/30/24 Manual Therapy: -PROM of L hip, all planes except adduction and remained within limits of protocol Therapeutic Exercise: -Quadruped rock backs - maintaining protocol ROM limitations - Sit to stand with cues for appropriate form and within protocol limits - Side stepping (no resistance) - Backward walking (no resistance and within pain free) - Step up w/ R LE and down with L LE  07/28/2024  Manual Therapy: -PROM of L hip, all planes except adduction and remained within limits of protocol Therapeutic Exercise: -Standing heel raises, 1 set of 10 reps, pt cued for increased ROM -Hip abduction/adduction isometrics, 1 sets of 10 reps, 5 second holds, pt cued for region of activation (belt and ball utilized) -Glute sets, 2 sets of 10 reps, 3 second holds, pt cued for increased glute activation -Standing hip abductions, 1 set of 5 reps, pt cued for less than 30 degrees of hip abduction -Standing marches, 1 set of 10 reps bilaterally, pt cued for less than 90 degrees of hip flexion -Standing weight shifts to SLS on LLE, 1 set of 10 reps, 3 second holds  PATIENT EDUCATION: Education details: HEP, prognosis, healing, and swelling Person educated: Patient Education method: Explanation, Demonstration, Verbal cues, and Handouts Education comprehension: verbalized understanding, further education recommended   HOME EXERCISE PROGRAM: Access Code: [email or web address] URL:  [email or web address] Date: 07/21/2024 Prepared by: Redell Moose  Exercises - Supine Heel Slide with Strap  - 2 x daily - 6 x weekly - 1 sets - 10 reps - Bent Knee Fallouts  - 2 x daily - 6 x weekly - 1 sets - 10 reps - Seated Hip Adduction Isometrics with Ball  - 2 x daily - 6 x weekly - 1 sets - 10 reps - 5 sec hold - Standing March with Counter Support  - 2 x daily - 6 x weekly - 1 sets - 10 reps - Standing Hip Abduction with Counter Support  - 2 x daily - 6 x weekly - 1 sets - 10  reps  ASSESSMENT:  CLINICAL IMPRESSION: Patient presented to treatment with increased left hip pain this morning with no known cause which resulted in today's treatment focusing in manual therapy and familiar interventions. She was progressed with bridges to include a lower extremity march with moderate difficulty. She required minimal cueing for proper foot clearance to facilitate appropriate muscular engagement. Manual therapy focused on soft tissue mobilization to her piriformis and retrograde massage for reduced edema with good results. She reported feeling better upon the conclusion of treatment. Patient continues to require skilled physical therapy to address her remaining impairments to return to her prior level of function.    Eval: Patient referred to PT post op hip labrum repair on left 07/16/24. Overall doing well up to this point and she is WBAT. I reviewed post op precautions with her today and she will follow back up with surgeon next week. Patient will benefit from skilled PT to improve overall function and to address impairments and limitations listed below.  OBJECTIVE IMPAIRMENTS: decreased activity tolerance for ADL's, difficulty walking, decreased balance, decreased endurance, decreased mobility, decreased ROM, decreased strength, impaired flexibility, impaired LE use, and pain.  ACTIVITY LIMITATIONS: bending, liftting, walking, standing, cleaning, community activity, driving,    PERSONAL FACTORS: see above PMH  also affecting patient's functional outcome.  REHAB POTENTIAL: Good  CLINICAL DECISION MAKING: Stable/uncomplicated  EVALUATION COMPLEXITY: Low    GOALS: Short term PT Goals Target date: 08/18/2024   Pt will be I and compliant with HEP. Baseline:  Goal status: in progress  2. Pt will improve 5 times sit to stand test to less than 25 seconds  Baseline 47 sec  Goal status:in progress Long term PT goals Target date:09/15/2024   Pt will improve left hip AROM to [facility] to improve functional mobility Baseline: Goal status: in progress Pt will improve left LE  strength to at least 4+/5 MMT to improve functional strength Baseline: Goal status: in progress Pt will improve Patient specific functional scale (PSFS) to at least 7/10 to show improved function level Baseline: Goal status: in progress Pt will reduce pain to overall less than 3/10 with usual activity and with ambulating community distances Baseline: Goal status: in progress Pt will be improve 5 times sit to stand test to less than 13 seconds Baseline:47 sec Goal status: in progress  PLAN: PT FREQUENCY: 1-2 times per week   PT DURATION: 6-8 weeks  PLANNED INTERVENTIONS  97110-Therapeutic exercises, 97530- Therapeutic activity, [id number]- Neuromuscular re-education, 97535- Self Care, [id number]- Manual therapy, and Patient/Family education  PLAN FOR NEXT SESSION: beginner hip/glute activation and ROM within protocol parameters NEXT MD VISIT:   Lacinda Fass, PT, DPT  4:54 PM, 08/07/24

## 2024-08-10 ENCOUNTER — Encounter: Admitting: Physical Therapy

## 2024-08-11 ENCOUNTER — Ambulatory Visit (HOSPITAL_COMMUNITY)

## 2024-08-11 DIAGNOSIS — M25552 Pain in left hip: Secondary | ICD-10-CM | POA: Diagnosis not present

## 2024-08-11 DIAGNOSIS — R262 Difficulty in walking, not elsewhere classified: Secondary | ICD-10-CM

## 2024-08-11 DIAGNOSIS — M6281 Muscle weakness (generalized): Secondary | ICD-10-CM

## 2024-08-11 DIAGNOSIS — R600 Localized edema: Secondary | ICD-10-CM

## 2024-08-11 NOTE — Therapy (Signed)
 OUTPATIENT PHYSICAL THERAPY TREATMENT   Patient Name: Rhonda Porter MRN: [id number] DOB:1971-01-24, 53 y.o., female Today's Date: 08/11/2024  END OF SESSION:  PT End of Session - 08/11/24 0700     Visit Number 7    Number of Visits 15    Date for Recertification  09/15/24    Authorization Type [employer]et[employer]    Authorization Time Period no auth needed    PT Start Time [phone]    PT Stop Time 1028    PT Time Calculation (min) 41 min    Activity Tolerance Patient tolerated treatment well    Behavior During Therapy [facility] for tasks assessed/performed             Past Medical History:  Diagnosis Date   Diverticula of colon    Ganglion cyst    Gastritis    History of colonic polyps    Hypertension    on 03/08/15 pt denies high bp in over 10 yrs    IBS (irritable bowel syndrome)    Melanoma (HCC) 10/29/2004   seasonal allergies    Past Surgical History:  Procedure Laterality Date   ABDOMINAL HYSTERECTOMY  10/29/1997   CARPAL TUNNEL RELEASE Right    COLONOSCOPY  03/08/2015   FOOT SURGERY Bilateral    surg x 3 right, surg x 1 left   GLUTEUS MINIMUS REPAIR Left 07/30/2022   Procedure: LEFT GLUTEUS MEDIUS REPAIR;  Surgeon: Genelle Standing, MD;  Location: [facility] OR;  Service: Orthopedics;  Laterality: Left;   KNEE ARTHROSCOPY Right 10/30/2011   MELANOMA EXCISION  10/29/2004   left shoulder   SPINAL FUSION     UPPER GASTROINTESTINAL ENDOSCOPY  03/08/2015   Dr.Pyrtle   WISDOM TOOTH EXTRACTION     Patient Active Problem List   Diagnosis Date Noted   Tear of left gluteus medius tendon    Lower abdominal pain 04/11/2017   Menopausal symptom 04/11/2017   Pain in pelvis 04/11/2017   LUQ abdominal pain 02/10/2015   Bloating 02/08/2015   Irritable bowel syndrome (IBS) 02/08/2015   Abdominal cramps 02/08/2015   DIVERTICULAR BLEEDING, HX OF 01/18/2009   HYPERTENSION 01/17/2009   History of colonic polyps 01/17/2009    PCP: Dow Longs, PA-C   REFERRING PROVIDER: Genelle Standing,  MD   REFERRING DIAG: [phone] (ICD-10-CM) - Tear of left acetabular labrum, initial encounter   Rationale for Evaluation and Treatment:  Rehabiliation  THERAPY DIAG:  Pain in left hip  Muscle weakness (generalized)  Difficulty walking  Localized edema  ONSET DATE: post op hip labrum repair on left 07/16/24 *see protocol under media or on Savaughn Karwowski L's desk   SUBJECTIVE:  SUBJECTIVE STATEMENT: 2/10 pain today left hip  EVAL:She had hip labral repair. Since she is now doing okay, WBAT. She did take meds before coming.   PERTINENT HISTORY:  See above PMH Had a left glute med repair 10/23 per [facility]  PAIN:  NPRS scale: 4/10 upon arrival Pain location:left hip around incision Pain description: constant sore Aggravating factors: standing too long Relieving factors: rest, meds, ice   PRECAUTIONS: ,  Hip post op protocol- [email or web address]   RED FLAGS: None   WEIGHT BEARING RESTRICTIONS:  WBAT  FALLS:  Has patient fallen in last 6 months? 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  PATIENT SURVEYS:  Patient-Specific Activity Scoring Scheme  0 represents "unable to perform." 10 represents "able to perform at prior level. 0 1 2 3 4 5 6 7 8 9  10 (Date and Score)   Activity Eval     1. walking 3    2. Getting in/out of bed  6    3.     4.    5.    Score 4.5    Total score = sum of the activity scores/number of activities Minimum detectable change  (90%CI) for average score = 2 points Minimum detectable change (90%CI) for single activity score = 3 points     EDEMA:  Yes: in left hip  GAIT: Eval: Assistive device utilized: None Level of assistance: Complete Independence Comments: slow ambulation, shorter steps, antalgic gait, limited distances    LOWER EXTREMITY ROM:     Active /PROM Left eval   Hip flexion 70/90   Hip extension    Hip abduction 20/30   Hip adduction    Hip internal rotation /30   Hip external rotation /30   Knee flexion    Knee extension    Ankle dorsiflexion    Ankle plantarflexion    Ankle inversion    Ankle eversion     (Blank rows = not tested)   LOWER EXTREMITY MMT:    MMT Left Eval   Hip flexion 2   Hip extension    Hip abduction 2   Hip adduction    Hip internal rotation 2   Hip external rotation 2   Knee flexion 3   Knee extension 3   Ankle dorsiflexion    Ankle plantarflexion    Ankle inversion    Ankle eversion     (Blank rows = not tested)    FUNCTIONAL TESTS:  Eval: 5 times sit to stand: 47 seconds                                                                                                                              TREATMENT DATE:  Upright bike x 5' dynamic warm up Mini squats x 10 Bridge 5 x 10  Single leg bridge 5 x 10 TA contraction with bent knee fall outs x 10 Clam 2 x 10 Prone hamstring curls x 10 Prone IR/ER ROM x 10 each                                    08/07/24  EXERCISE LOG  Exercise Repetitions and Resistance Comments  Upright bike  4 minutes    Static stance on foam   2 minutes  Infrequent UE support   Bridge w/ march   10 reps    Supine hip ADD isometric  10 reps w/ 5 second hold    SLR   2 x 5 reps  LLE only; partial ROM  Manual therapy  STM to left piriformis  Retrograde massage to L hip and thigh For reduced pain and swelling   Blank cell = exercise not performed today   08/04/24: Therapist viewed incision/rash with picture  taken (check media)   Media Information  Document Information  Photos  Lt hip incision  08/04/2024 09:54  Attached To:  Outpatient Rehab on 08/04/24 with Renae Augustin PARAS, PTA  Source Information  Renae Augustin PARAS, PTA  [facility]   Discussed edema control strategies Educated s/s with blood clot Benefits of compression garments thigh high  ETI measurements and handout given Supine: Bridge 10x 2 sets 5 PROM of L hip, all planes except adduction and remained within limits of protocol Log rolling Sidelying:  Clam for ER 10x 5 Clam for IR 10x 5 Retrograde massage with LE elevated   Upright bike x 4 min   07/30/24 Manual Therapy: -PROM of L hip, all planes except adduction and remained within limits of protocol Therapeutic Exercise: -Quadruped rock backs - maintaining protocol ROM limitations - Sit to stand with cues for appropriate form and within protocol limits - Side stepping (no resistance) - Backward walking (no resistance and within pain free) - Step up w/ R LE and down with L LE  07/28/2024  Manual Therapy: -PROM of L hip, all planes except adduction and remained within limits of protocol Therapeutic Exercise: -Standing heel raises, 1 set of 10 reps, pt cued for increased ROM -Hip abduction/adduction isometrics, 1 sets of 10 reps, 5 second holds, pt cued for region of activation (belt and ball utilized) -Glute sets, 2 sets of 10 reps, 3 second holds, pt cued for increased glute activation -Standing hip abductions, 1 set of 5 reps, pt cued for less than 30 degrees of hip abduction -Standing marches, 1 set of 10 reps bilaterally, pt cued for less than 90 degrees of hip flexion -Standing weight shifts to SLS on LLE, 1 set of 10 reps, 3 second holds  PATIENT EDUCATION: Education details: HEP, prognosis, healing, and swelling Person educated: Patient Education method: Explanation, Demonstration, Verbal cues, and Handouts Education comprehension:  verbalized understanding, further education recommended   HOME EXERCISE PROGRAM: Access Code: [email or web address] URL: [email or web address] Date: 07/21/2024 Prepared by: Redell Moose  Exercises - Supine Heel Slide with Strap  - 2 x daily - 6 x weekly - 1 sets - 10 reps - Bent Knee Fallouts  - 2 x daily - 6 x weekly - 1 sets - 10 reps - Seated Hip Adduction Isometrics with Ball  - 2 x daily - 6 x weekly - 1 sets - 10 reps - 5 sec hold - Standing March with Counter Support  - 2 x daily - 6  x weekly - 1 sets - 10 reps - Standing Hip Abduction with Counter Support  - 2 x daily - 6 x weekly - 1 sets - 10 reps  ASSESSMENT:  CLINICAL IMPRESSION: Today's session with focus on right hip mobility and strength per protocol.  Decreased pain overall today but she does have some tenderness along incision line.  Added mini squats to simulate working in [employer].  Added hamstring curls and hip internal rotation and external rotation today without issue.  Updated HEP.  Patient will benefit from continued skilled therapy services to address deficits and promote return to optimal function.     Eval: Patient referred to PT post op hip labrum repair on left 07/16/24. Overall doing well up to this point and she is WBAT. I reviewed post op precautions with her today and she will follow back up with surgeon next week. Patient will benefit from skilled PT to improve overall function and to address impairments and limitations listed below.  OBJECTIVE IMPAIRMENTS: decreased activity tolerance for ADL's, difficulty walking, decreased balance, decreased endurance, decreased mobility, decreased ROM, decreased strength, impaired flexibility, impaired LE use, and pain.  ACTIVITY LIMITATIONS: bending, liftting, walking, standing, cleaning, community activity, driving,   PERSONAL FACTORS: see above PMH  also affecting patient's functional outcome.  REHAB POTENTIAL: Good  CLINICAL DECISION MAKING:  Stable/uncomplicated  EVALUATION COMPLEXITY: Low    GOALS: Short term PT Goals Target date: 08/18/2024   Pt will be I and compliant with HEP. Baseline:  Goal status: in progress  2. Pt will improve 5 times sit to stand test to less than 25 seconds  Baseline 47 sec  Goal status:in progress Long term PT goals Target date:09/15/2024   Pt will improve left hip AROM to [facility] to improve functional mobility Baseline: Goal status: in progress Pt will improve left LE  strength to at least 4+/5 MMT to improve functional strength Baseline: Goal status: in progress Pt will improve Patient specific functional scale (PSFS) to at least 7/10 to show improved function level Baseline: Goal status: in progress Pt will reduce pain to overall less than 3/10 with usual activity and with ambulating community distances Baseline: Goal status: in progress Pt will be improve 5 times sit to stand test to less than 13 seconds Baseline:47 sec Goal status: in progress  PLAN: PT FREQUENCY: 1-2 times per week   PT DURATION: 6-8 weeks  PLANNED INTERVENTIONS  97110-Therapeutic exercises, 97530- Therapeutic activity, 97112- Neuromuscular re-education, 97535- Self Care, 02859- Manual therapy, and Patient/Family education  PLAN FOR NEXT SESSION: beginner hip/glute activation and ROM within protocol parameters NEXT MD VISIT:   10:37 AM, 08/11/24 Maleiah Dula Small Abelardo Seidner MPT [facility] [place] [id number] Ph:[phone]

## 2024-08-12 ENCOUNTER — Encounter: Admitting: Physical Therapy

## 2024-08-13 ENCOUNTER — Ambulatory Visit (HOSPITAL_COMMUNITY)

## 2024-08-13 DIAGNOSIS — R262 Difficulty in walking, not elsewhere classified: Secondary | ICD-10-CM

## 2024-08-13 DIAGNOSIS — M25552 Pain in left hip: Secondary | ICD-10-CM

## 2024-08-13 DIAGNOSIS — R600 Localized edema: Secondary | ICD-10-CM

## 2024-08-13 DIAGNOSIS — M6281 Muscle weakness (generalized): Secondary | ICD-10-CM

## 2024-08-13 NOTE — Therapy (Signed)
 OUTPATIENT PHYSICAL THERAPY TREATMENT   Patient Name: Rhonda Porter MRN: [id number] DOB:1971-03-14, 53 y.o., female Today's Date: 08/13/2024  END OF SESSION:  PT End of Session - 08/13/24 0948     Visit Number 8    Number of Visits 15    Date for Recertification  09/15/24    Authorization Type Aetna    Authorization Time Period no auth needed    PT Start Time [phone]    PT Stop Time 1027    PT Time Calculation (min) 40 min    Activity Tolerance Patient tolerated treatment well    Behavior During Therapy [facility] for tasks assessed/performed             Past Medical History:  Diagnosis Date   Diverticula of colon    Ganglion cyst    Gastritis    History of colonic polyps    Hypertension    on 03/08/15 pt denies high bp in over 10 yrs    IBS (irritable bowel syndrome)    Melanoma (HCC) 10/29/2004   seasonal allergies    Past Surgical History:  Procedure Laterality Date   ABDOMINAL HYSTERECTOMY  10/29/1997   CARPAL TUNNEL RELEASE Right    COLONOSCOPY  03/08/2015   FOOT SURGERY Bilateral    surg x 3 right, surg x 1 left   GLUTEUS MINIMUS REPAIR Left 07/30/2022   Procedure: LEFT GLUTEUS MEDIUS REPAIR;  Surgeon: Genelle Standing, MD;  Location: [facility] OR;  Service: Orthopedics;  Laterality: Left;   KNEE ARTHROSCOPY Right 10/30/2011   MELANOMA EXCISION  10/29/2004   left shoulder   SPINAL FUSION     UPPER GASTROINTESTINAL ENDOSCOPY  03/08/2015   Dr.Pyrtle   WISDOM TOOTH EXTRACTION     Patient Active Problem List   Diagnosis Date Noted   Tear of left gluteus medius tendon    Lower abdominal pain 04/11/2017   Menopausal symptom 04/11/2017   Pain in pelvis 04/11/2017   LUQ abdominal pain 02/10/2015   Bloating 02/08/2015   Irritable bowel syndrome (IBS) 02/08/2015   Abdominal cramps 02/08/2015   DIVERTICULAR BLEEDING, HX OF 01/18/2009   HYPERTENSION 01/17/2009   History of colonic polyps 01/17/2009    PCP: Dow Longs, PA-C   REFERRING PROVIDER: Genelle Standing,  MD   REFERRING DIAG: [phone] (ICD-10-CM) - Tear of left acetabular labrum, initial encounter   Rationale for Evaluation and Treatment:  Rehabiliation  THERAPY DIAG:  Pain in left hip  Muscle weakness (generalized)  Difficulty walking  Localized edema  ONSET DATE: post op hip labrum repair on left 07/16/24 *see protocol under media or on Connelly [place] L's desk   SUBJECTIVE:  SUBJECTIVE STATEMENT: 2/10 pain today left hip; did her exercises this morning   EVAL:She had hip labral repair. Since she is now doing okay, WBAT. She did take meds before coming.   PERTINENT HISTORY:  See above PMH Had a left glute med repair 10/23 per [facility]  PAIN:  NPRS scale: 4/10 upon arrival Pain location:left hip around incision Pain description: constant sore Aggravating factors: standing too long Relieving factors: rest, meds, ice   PRECAUTIONS: ,  Hip post op protocol- [email or web address]   RED FLAGS: None   WEIGHT BEARING RESTRICTIONS:  WBAT  FALLS:  Has patient fallen in last 6 months? 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  PATIENT SURVEYS:  Patient-Specific Activity Scoring Scheme  0 represents "unable to perform." 10 represents "able to perform at prior level. 0 1 2 3 4 5 6 7 8 9  10 (Date and Score)   Activity Eval     1. walking 3    2. Getting in/out of bed  6    3.     4.    5.    Score 4.5    Total score = sum of the activity scores/number of  activities Minimum detectable change (90%CI) for average score = 2 points Minimum detectable change (90%CI) for single activity score = 3 points     EDEMA:  Yes: in left hip  GAIT: Eval: Assistive device utilized: None Level of assistance: Complete Independence Comments: slow ambulation, shorter steps, antalgic gait, limited distances    LOWER EXTREMITY ROM:     Active /PROM Left eval   Hip flexion 70/90   Hip extension    Hip abduction 20/30   Hip adduction    Hip internal rotation /30   Hip external rotation /30   Knee flexion    Knee extension    Ankle dorsiflexion    Ankle plantarflexion    Ankle inversion    Ankle eversion     (Blank rows = not tested)   LOWER EXTREMITY MMT:    MMT Left Eval   Hip flexion 2   Hip extension    Hip abduction 2   Hip adduction    Hip internal rotation 2   Hip external rotation 2   Knee flexion 3   Knee extension 3   Ankle dorsiflexion    Ankle plantarflexion    Ankle inversion    Ankle eversion     (Blank rows = not tested)    FUNCTIONAL TESTS:  Eval: 5 times sit to stand: 47 seconds                                                                                                                              TREATMENT DATE:  08/13/24 Upright bike x 5' dynamic warm up Backwards  walking on treadmill x 3' at  .6 mph Sidestepping 30 ft line down and back x 3 with yellow band around ankles Quadruped rocking slow 2 x 10 Quadruped alternating arms 2 x 10 Sidelying clam with yellow band 2 x 10 Bridge with yellow band 2 x 10 Updated HEP  08/11/24 Upright bike x 5' dynamic warm up Mini squats x 10 Bridge 5 x 10 Single leg bridge 5 x 10 TA contraction with bent knee fall outs x 10 Clam 2 x 10 Prone hamstring curls x 10 Prone IR/ER ROM x 10 each                                    08/07/24  EXERCISE LOG  Exercise Repetitions and Resistance Comments  Upright bike  4 minutes    Static stance on foam   2  minutes  Infrequent UE support   Bridge w/ march   10 reps    Supine hip ADD isometric  10 reps w/ 5 second hold    SLR   2 x 5 reps  LLE only; partial ROM  Manual therapy  STM to left piriformis  Retrograde massage to L hip and thigh For reduced pain and swelling   Blank cell = exercise not performed today   08/04/24: Therapist viewed incision/rash with picture taken (check media)   Media Information  Document Information  Photos  Lt hip incision  08/04/2024 09:54  Attached To:  Outpatient Rehab on 08/04/24 with Renae Augustin PARAS, PTA  Source Information  Renae Augustin PARAS, PTA  [facility]   Discussed edema control strategies Educated s/s with blood clot Benefits of compression garments thigh high  ETI measurements and handout given Supine: Bridge 10x 2 sets 5 PROM of L hip, all planes except adduction and remained within limits of protocol Log rolling Sidelying:  Clam for ER 10x 5 Clam for IR 10x 5 Retrograde massage with LE elevated   Upright bike x 4 min   07/30/24 Manual Therapy: -PROM of L hip, all planes except adduction and remained within limits of protocol Therapeutic Exercise: -Quadruped rock backs - maintaining protocol ROM limitations - Sit to stand with cues for appropriate form and within protocol limits - Side stepping (no resistance) - Backward walking (no resistance and within pain free) - Step up w/ R LE and down with L LE  07/28/2024  Manual Therapy: -PROM of L hip, all planes except adduction and remained within limits of protocol Therapeutic Exercise: -Standing heel raises, 1 set of 10 reps, pt cued for increased ROM -Hip abduction/adduction isometrics, 1 sets of 10 reps, 5 second holds, pt cued for region of activation (belt and ball utilized) -Glute sets, 2 sets of 10 reps, 3 second holds, pt cued for increased glute activation -Standing hip abductions, 1 set of 5 reps, pt cued for less than 30 degrees of hip  abduction -Standing marches, 1 set of 10 reps bilaterally, pt cued for less than 90 degrees of hip flexion -Standing weight shifts to SLS on LLE, 1 set of 10 reps, 3 second holds  PATIENT EDUCATION: Education details: HEP, prognosis, healing, and swelling Person educated: Patient Education method: Explanation, Demonstration, Verbal cues, and Handouts Education comprehension: verbalized understanding, further education recommended   HOME EXERCISE PROGRAM: Access Code: [email or web address] URL: [email or web address] Date: 07/21/2024 Prepared by: Redell Moose  Exercises - Supine Heel Slide with Strap  - 2 x  daily - 6 x weekly - 1 sets - 10 reps - Bent Knee Fallouts  - 2 x daily - 6 x weekly - 1 sets - 10 reps - Seated Hip Adduction Isometrics with Ball  - 2 x daily - 6 x weekly - 1 sets - 10 reps - 5 sec hold - Standing March with Counter Support  - 2 x daily - 6 x weekly - 1 sets - 10 reps - Standing Hip Abduction with Counter Support  - 2 x daily - 6 x weekly - 1 sets - 10 reps  ASSESSMENT:  CLINICAL IMPRESSION: Today's session with continued focus on right hip mobility and strength per protocol. Added backwards walking and sidestepping to treatment today and issued theraband for home use.  Progressed bridge and clam by added theraband to increase intensity today.  Continues with antalgic gait. Updated HEP.  Patient will benefit from continued skilled therapy services to address deficits and promote return to optimal function.     Eval: Patient referred to PT post op hip labrum repair on left 07/16/24. Overall doing well up to this point and she is WBAT. I reviewed post op precautions with her today and she will follow back up with surgeon next week. Patient will benefit from skilled PT to improve overall function and to address impairments and limitations listed below.  OBJECTIVE IMPAIRMENTS: decreased activity tolerance for ADL's, difficulty walking, decreased balance, decreased  endurance, decreased mobility, decreased ROM, decreased strength, impaired flexibility, impaired LE use, and pain.  ACTIVITY LIMITATIONS: bending, liftting, walking, standing, cleaning, community activity, driving,   PERSONAL FACTORS: see above PMH  also affecting patient's functional outcome.  REHAB POTENTIAL: Good  CLINICAL DECISION MAKING: Stable/uncomplicated  EVALUATION COMPLEXITY: Low    GOALS: Short term PT Goals Target date: 08/18/2024   Pt will be I and compliant with HEP. Baseline:  Goal status: in progress  2. Pt will improve 5 times sit to stand test to less than 25 seconds  Baseline 47 sec  Goal status:in progress Long term PT goals Target date:09/15/2024   Pt will improve left hip AROM to [facility] to improve functional mobility Baseline: Goal status: in progress Pt will improve left LE  strength to at least 4+/5 MMT to improve functional strength Baseline: Goal status: in progress Pt will improve Patient specific functional scale (PSFS) to at least 7/10 to show improved function level Baseline: Goal status: in progress Pt will reduce pain to overall less than 3/10 with usual activity and with ambulating community distances Baseline: Goal status: in progress Pt will be improve 5 times sit to stand test to less than 13 seconds Baseline:47 sec Goal status: in progress  PLAN: PT FREQUENCY: 1-2 times per week   PT DURATION: 6-8 weeks  PLANNED INTERVENTIONS  97110-Therapeutic exercises, 97530- Therapeutic activity, 97112- Neuromuscular re-education, 97535- Self Care, [id number]- Manual therapy, and Patient/Family education  PLAN FOR NEXT SESSION: beginner hip/glute activation and ROM within protocol parameters; focus on correcting limp NEXT MD VISIT:   10:36 AM, 08/13/24 Seann Genther Small Dominick Zertuche MPT [facility] [place] [id number] [phone]

## 2024-08-17 ENCOUNTER — Encounter: Admitting: Physical Therapy

## 2024-08-17 ENCOUNTER — Ambulatory Visit (HOSPITAL_COMMUNITY)

## 2024-08-17 DIAGNOSIS — R600 Localized edema: Secondary | ICD-10-CM

## 2024-08-17 DIAGNOSIS — M25552 Pain in left hip: Secondary | ICD-10-CM | POA: Diagnosis not present

## 2024-08-17 DIAGNOSIS — R262 Difficulty in walking, not elsewhere classified: Secondary | ICD-10-CM

## 2024-08-17 DIAGNOSIS — M6281 Muscle weakness (generalized): Secondary | ICD-10-CM

## 2024-08-17 NOTE — Therapy (Signed)
 OUTPATIENT PHYSICAL THERAPY TREATMENT   Patient Name: Rhonda Porter MRN: [id number] DOB:Dec 09, 1970, 53 y.o., female Today's Date: 08/17/2024  END OF SESSION:  PT End of Session - 08/17/24 1328     Visit Number 9    Number of Visits 15    Date for Recertification  09/15/24    Authorization Type Aetna    Authorization Time Period no auth needed    PT Start Time 1328    PT Stop Time 1408    PT Time Calculation (min) 40 min    Activity Tolerance Patient tolerated treatment well    Behavior During Therapy [facility] for tasks assessed/performed             Past Medical History:  Diagnosis Date   Diverticula of colon    Ganglion cyst    Gastritis    History of colonic polyps    Hypertension    on 03/08/15 pt denies high bp in over 10 yrs    IBS (irritable bowel syndrome)    Melanoma (HCC) 10/29/2004   seasonal allergies    Past Surgical History:  Procedure Laterality Date   ABDOMINAL HYSTERECTOMY  10/29/1997   CARPAL TUNNEL RELEASE Right    COLONOSCOPY  03/08/2015   FOOT SURGERY Bilateral    surg x 3 right, surg x 1 left   GLUTEUS MINIMUS REPAIR Left 07/30/2022   Procedure: LEFT GLUTEUS MEDIUS REPAIR;  Surgeon: Genelle Standing, MD;  Location: [facility] OR;  Service: Orthopedics;  Laterality: Left;   KNEE ARTHROSCOPY Right 10/30/2011   MELANOMA EXCISION  10/29/2004   left shoulder   SPINAL FUSION     UPPER GASTROINTESTINAL ENDOSCOPY  03/08/2015   Dr.Pyrtle   WISDOM TOOTH EXTRACTION     Patient Active Problem List   Diagnosis Date Noted   Tear of left gluteus medius tendon    Lower abdominal pain 04/11/2017   Menopausal symptom 04/11/2017   Pain in pelvis 04/11/2017   LUQ abdominal pain 02/10/2015   Bloating 02/08/2015   Irritable bowel syndrome (IBS) 02/08/2015   Abdominal cramps 02/08/2015   DIVERTICULAR BLEEDING, HX OF 01/18/2009   HYPERTENSION 01/17/2009   History of colonic polyps 01/17/2009    PCP: Dow Longs, PA-C   REFERRING PROVIDER: Genelle Standing,  MD   REFERRING DIAG: [phone] (ICD-10-CM) - Tear of left acetabular labrum, initial encounter   Rationale for Evaluation and Treatment:  Rehabiliation  THERAPY DIAG:  Pain in left hip  Muscle weakness (generalized)  Difficulty walking  Localized edema  ONSET DATE: post op hip labrum repair on left 07/16/24 *see protocol under media or on Anthonia Monger L's desk   SUBJECTIVE:  SUBJECTIVE STATEMENT:  Went to the grocery last week and vacuumed with dyson   EVAL:She had hip labral repair. Since she is now doing okay, WBAT. She did take meds before coming.   PERTINENT HISTORY:  See above PMH Had a left glute med repair 10/23 per [facility]  PAIN:  NPRS scale: 4/10 upon arrival Pain location:left hip around incision Pain description: constant sore Aggravating factors: standing too long Relieving factors: rest, meds, ice   PRECAUTIONS: ,  Hip post op protocol- [email or web address]   RED FLAGS: None   WEIGHT BEARING RESTRICTIONS:  WBAT  FALLS:  Has patient fallen in last 6 months? 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  PATIENT SURVEYS:  Patient-Specific Activity Scoring Scheme  0 represents "unable to perform." 10 represents "able to perform at prior level. 0 1 2 3 4 5 6 7 8 9  10 (Date and Score)   Activity Eval     1. walking 3    2. Getting in/out of bed  6    3.     4.    5.    Score 4.5    Total score = sum of the activity scores/number of  activities Minimum detectable change (90%CI) for average score = 2 points Minimum detectable change (90%CI) for single activity score = 3 points     EDEMA:  Yes: in left hip  GAIT: Eval: Assistive device utilized: None Level of assistance: Complete Independence Comments: slow ambulation, shorter steps, antalgic gait, limited distances    LOWER EXTREMITY ROM:     Active /PROM Left eval   Hip flexion 70/90   Hip extension    Hip abduction 20/30   Hip adduction    Hip internal rotation /30   Hip external rotation /30   Knee flexion    Knee extension    Ankle dorsiflexion    Ankle plantarflexion    Ankle inversion    Ankle eversion     (Blank rows = not tested)   LOWER EXTREMITY MMT:    MMT Left Eval   Hip flexion 2   Hip extension    Hip abduction 2   Hip adduction    Hip internal rotation 2   Hip external rotation 2   Knee flexion 3   Knee extension 3   Ankle dorsiflexion    Ankle plantarflexion    Ankle inversion    Ankle eversion     (Blank rows = not tested)    FUNCTIONAL TESTS:  Eval: 5 times sit to stand: 47 seconds                                                                                                                              TREATMENT DATE:  08/17/24 Upright bike x 5' dynamic warm up  Heel raises on incline 2 x 10 Toe raises on decline 2 x 10 Slant board 5 x 20 Discussion of no pain with hip flexion/mini squat Prone hip extension x 10 Prone IR/ER x 10 each  08/13/24 Upright bike x 5' dynamic warm up Backwards walking on treadmill x 3' at  .6 mph Sidestepping 30 ft line down and back x 3 with yellow band around ankles Quadruped rocking slow 2 x 10 Quadruped alternating arms 2 x 10 Sidelying clam with yellow band 2 x 10 Bridge with yellow band 2 x 10 Updated HEP  08/11/24 Upright bike x 5' dynamic warm up Mini squats x 10 Bridge 5 x 10 Single leg bridge 5 x 10 TA contraction with bent knee fall outs x 10 Clam 2 x  10 Prone hamstring curls x 10 Prone IR/ER ROM x 10 each                                    08/07/24  EXERCISE LOG  Exercise Repetitions and Resistance Comments  Upright bike  4 minutes    Static stance on foam   2 minutes  Infrequent UE support   Bridge w/ march   10 reps    Supine hip ADD isometric  10 reps w/ 5 second hold    SLR   2 x 5 reps  LLE only; partial ROM  Manual therapy  STM to left piriformis  Retrograde massage to L hip and thigh For reduced pain and swelling   Blank cell = exercise not performed today   08/04/24: Therapist viewed incision/rash with picture taken (check media)   Media Information  Document Information  Photos  Lt hip incision  08/04/2024 09:54  Attached To:  Outpatient Rehab on 08/04/24 with Renae Augustin PARAS, PTA  Source Information  Renae Augustin PARAS, PTA  [facility]   Discussed edema control strategies Educated s/s with blood clot Benefits of compression garments thigh high  ETI measurements and handout given Supine: Bridge 10x 2 sets 5 PROM of L hip, all planes except adduction and remained within limits of protocol Log rolling Sidelying:  Clam for ER 10x 5 Clam for IR 10x 5 Retrograde massage with LE elevated   Upright bike x 4 min   07/30/24 Manual Therapy: -PROM of L hip, all planes except adduction and remained within limits of protocol Therapeutic Exercise: -Quadruped rock backs - maintaining protocol ROM limitations - Sit to stand with cues for appropriate form and within protocol limits - Side stepping (no resistance) - Backward walking (no resistance and within pain free) - Step up w/ R LE and down with L LE  07/28/2024  Manual Therapy: -PROM of L hip, all planes except adduction and remained within limits of protocol Therapeutic Exercise: -Standing heel raises, 1 set of 10 reps, pt cued for increased ROM -Hip abduction/adduction isometrics, 1 sets of 10 reps, 5 second holds, pt cued for region of  activation (belt and ball utilized) -Glute sets, 2 sets of 10 reps, 3 second holds, pt cued for increased glute activation -Standing hip abductions, 1 set of 5 reps, pt cued for less than 30 degrees of hip abduction -Standing marches, 1 set of 10 reps bilaterally, pt cued for less than 90 degrees of hip flexion -Standing weight shifts to SLS on LLE, 1 set of 10 reps, 3 second holds  PATIENT EDUCATION: Education details: HEP, prognosis, healing, and  swelling Person educated: Patient Education method: Explanation, Demonstration, Verbal cues, and Handouts Education comprehension: verbalized understanding, further education recommended   HOME EXERCISE PROGRAM: Access Code: [email or web address] URL: [email or web address] Date: 07/21/2024 Prepared by: Redell Moose  Exercises - Supine Heel Slide with Strap  - 2 x daily - 6 x weekly - 1 sets - 10 reps - Bent Knee Fallouts  - 2 x daily - 6 x weekly - 1 sets - 10 reps - Seated Hip Adduction Isometrics with Ball  - 2 x daily - 6 x weekly - 1 sets - 10 reps - 5 sec hold - Standing March with Counter Support  - 2 x daily - 6 x weekly - 1 sets - 10 reps - Standing Hip Abduction with Counter Support  - 2 x daily - 6 x weekly - 1 sets - 10 reps  ASSESSMENT:  CLINICAL IMPRESSION: Today's session with continued focus on hip mobility and strength per protocol. Patient continues with a limp so brought out mirror to give patient visual feedback to decrease limp but improving stance phase left side; increasing toe off time.  Discussed proper amount of hip flexion with a squat to perform functional tasks in home.   Patient will benefit from continued skilled therapy services to address deficits and promote return to optimal function.     Eval: Patient referred to PT post op hip labrum repair on left 07/16/24. Overall doing well up to this point and she is WBAT. I reviewed post op precautions with her today and she will follow back up with surgeon next week.  Patient will benefit from skilled PT to improve overall function and to address impairments and limitations listed below.  OBJECTIVE IMPAIRMENTS: decreased activity tolerance for ADL's, difficulty walking, decreased balance, decreased endurance, decreased mobility, decreased ROM, decreased strength, impaired flexibility, impaired LE use, and pain.  ACTIVITY LIMITATIONS: bending, liftting, walking, standing, cleaning, community activity, driving,   PERSONAL FACTORS: see above PMH  also affecting patient's functional outcome.  REHAB POTENTIAL: Good  CLINICAL DECISION MAKING: Stable/uncomplicated  EVALUATION COMPLEXITY: Low    GOALS: Short term PT Goals Target date: 08/18/2024   Pt will be I and compliant with HEP. Baseline:  Goal status: in progress  2. Pt will improve 5 times sit to stand test to less than 25 seconds  Baseline 47 sec  Goal status:in progress Long term PT goals Target date:09/15/2024   Pt will improve left hip AROM to [facility] to improve functional mobility Baseline: Goal status: in progress Pt will improve left LE  strength to at least 4+/5 MMT to improve functional strength Baseline: Goal status: in progress Pt will improve Patient specific functional scale (PSFS) to at least 7/10 to show improved function level Baseline: Goal status: in progress Pt will reduce pain to overall less than 3/10 with usual activity and with ambulating community distances Baseline: Goal status: in progress Pt will be improve 5 times sit to stand test to less than 13 seconds Baseline:47 sec Goal status: in progress  PLAN: PT FREQUENCY: 1-2 times per week   PT DURATION: 6-8 weeks  PLANNED INTERVENTIONS  97110-Therapeutic exercises, 97530- Therapeutic activity, 97112- Neuromuscular re-education, 97535- Self Care, [id number]- Manual therapy, and Patient/Family education  PLAN FOR NEXT SESSION: beginner hip/glute activation and ROM within protocol parameters; focus on correcting  limp NEXT MD VISIT:   2:18 PM, 08/17/24 Janele Lague Small Sriram Febles MPT [facility]  [phone] [phone]

## 2024-08-19 ENCOUNTER — Encounter (HOSPITAL_COMMUNITY): Payer: Self-pay

## 2024-08-19 ENCOUNTER — Encounter: Admitting: Physical Therapy

## 2024-08-19 ENCOUNTER — Ambulatory Visit (HOSPITAL_COMMUNITY)

## 2024-08-19 DIAGNOSIS — M25552 Pain in left hip: Secondary | ICD-10-CM | POA: Diagnosis not present

## 2024-08-19 DIAGNOSIS — M6281 Muscle weakness (generalized): Secondary | ICD-10-CM

## 2024-08-19 DIAGNOSIS — R600 Localized edema: Secondary | ICD-10-CM

## 2024-08-19 DIAGNOSIS — R262 Difficulty in walking, not elsewhere classified: Secondary | ICD-10-CM

## 2024-08-19 NOTE — Therapy (Signed)
 OUTPATIENT PHYSICAL THERAPY TREATMENT  Progress Note Reporting Period 09/23 to 08/19/24  See note below for Objective Data and Assessment of Progress/Goals.     Patient Name: Rhonda Porter MRN: [id number] DOB:[birth year], 53 y.o., female Today's Date: 08/19/2024  END OF SESSION:  PT End of Session - 08/19/24 1424     Visit Number 10    Number of Visits 15    Date for Recertification  09/15/24    Authorization Type A[employer]    Authorization Time Period no auth needed    PT Start Time 1425    PT Stop Time 1503    PT Time Calculation (min) 38 min    Activity Tolerance Patient tolerated treatment well    Behavior During Therapy [facility] for tasks assessed/performed             Past Medical History:  Diagnosis Date   Diverticula of colon    Ganglion cyst    Gastritis    History of colonic polyps    Hypertension    on 03/08/15 pt denies high bp in over 10 yrs    IBS (irritable bowel syndrome)    Melanoma (HCC) 10/29/2004   seasonal allergies    Past Surgical History:  Procedure Laterality Date   ABDOMINAL HYSTERECTOMY  10/29/1997   CARPAL TUNNEL RELEASE Right    COLONOSCOPY  03/08/2015   FOOT SURGERY Bilateral    surg x 3 right, surg x 1 left   GLUTEUS MINIMUS REPAIR Left 07/30/2022   Procedure: LEFT GLUTEUS MEDIUS REPAIR;  Surgeon: Genelle Standing, MD;  Location: [facility] OR;  Service: Orthopedics;  Laterality: Left;   KNEE ARTHROSCOPY Right 10/30/2011   MELANOMA EXCISION  10/29/2004   left shoulder   SPINAL FUSION     UPPER GASTROINTESTINAL ENDOSCOPY  03/08/2015   Dr.Pyrtle   WISDOM TOOTH EXTRACTION     Patient Active Problem List   Diagnosis Date Noted   Tear of left gluteus medius tendon    Lower abdominal pain 04/11/2017   Menopausal symptom 04/11/2017   Pain in pelvis 04/11/2017   LUQ abdominal pain 02/10/2015   Bloating 02/08/2015   Irritable bowel syndrome (IBS) 02/08/2015   Abdominal cramps 02/08/2015   DIVERTICULAR BLEEDING, HX OF 01/18/2009   HYPERTENSION  01/17/2009   History of colonic polyps 01/17/2009    PCP: Dow Longs, PA-C   REFERRING PROVIDER: Genelle Standing, MD Next apt: 08/27/24   REFERRING DIAG: D26.807J (ICD-10-CM) - Tear of left acetabular labrum, initial encounter   Rationale for Evaluation and Treatment:  Rehabiliation  THERAPY DIAG:  Pain in left hip  Muscle weakness (generalized)  Difficulty walking  Localized edema  ONSET DATE: post op hip labrum repair on left 07/16/24 *see protocol under media or on Amy L's desk   SUBJECTIVE:  SUBJECTIVE STATEMENT:  Feeling good today, reports ability to shave legs earlier today.  Rash on side continues but feels like it is healing.  Continues to have swelling on hip.   EVAL:She had hip labral repair. Since she is now doing okay, WBAT. She did take meds before coming.   PERTINENT HISTORY:  See above PMH Had a left glute med repair 10/23 per [facility]  PAIN:  NPRS scale: 4/10 upon arrival Pain location:left hip around incision Pain description: constant sore Aggravating factors: standing too long Relieving factors: rest, meds, ice   PRECAUTIONS: ,  Hip post op protocol- [email or web address]   RED FLAGS: None   WEIGHT BEARING RESTRICTIONS:  WBAT  FALLS:  Has patient fallen in last 6 months? 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  PATIENT SURVEYS:  Patient-Specific Activity Scoring Scheme  0 represents "unable to perform." 10  represents "able to perform at prior level. 0 1 2 3 4 5 6 7 8 9  10 (Date and Score)   Activity Eval  08/19/24:   1. walking 3  7  2. Getting in/out of bed  6 9   3.     4.    5.    Score 4.5 8   Total score = sum of the activity scores/number of activities Minimum detectable change (90%CI) for average score = 2 points Minimum detectable change (90%CI) for single activity score = 3 points     EDEMA:  Yes: in left hip  GAIT: Eval: Assistive device utilized: None Level of assistance: Complete Independence Comments: slow ambulation, shorter steps, antalgic gait, limited distances    LOWER EXTREMITY ROM:     Active /PROM Left eval   Hip flexion 70/90   Hip extension    Hip abduction 20/30   Hip adduction    Hip internal rotation /30   Hip external rotation /30   Knee flexion    Knee extension    Ankle dorsiflexion    Ankle plantarflexion    Ankle inversion    Ankle eversion     (Blank rows = not tested)   LOWER EXTREMITY MMT:    MMT Left Eval Left  08/19/24 Right 08/19/24  Hip flexion 2 Not tested   Hip extension  2+ limited range 4/5  Hip abduction 2 2+ 4+  Hip adduction     Hip internal rotation 2    Hip external rotation 2    Knee flexion 3 3+ 5  Knee extension 3 3+ 4+  Ankle dorsiflexion  4   Ankle plantarflexion     Ankle inversion     Ankle eversion      (Blank rows = not tested)    FUNCTIONAL TESTS:  Eval: 5 times sit to stand: 47 seconds 353 no AD  TREATMENT DATE:  08/19/24: 330ft no AD 5STS 11.82 PSFS see above MMT see above Prone: hip extension 10x Lt side only Rockerboard lateral then DF/PF 2 min each  08/17/24 Upright bike x 5' dynamic warm up Heel raises on incline 2 x 10 Toe raises on decline 2 x 10 Slant board 5 x 20 Discussion of no pain with hip flexion/mini squat Prone hip extension  x 10 Prone IR/ER x 10 each  08/13/24 Upright bike x 5' dynamic warm up Backwards walking on treadmill x 3' at  .6 mph Sidestepping 30 ft line down and back x 3 with yellow band around ankles Quadruped rocking slow 2 x 10 Quadruped alternating arms 2 x 10 Sidelying clam with yellow band 2 x 10 Bridge with yellow band 2 x 10 Updated HEP  08/11/24 Upright bike x 5' dynamic warm up Mini squats x 10 Bridge 5 x 10 Single leg bridge 5 x 10 TA contraction with bent knee fall outs x 10 Clam 2 x 10 Prone hamstring curls x 10 Prone IR/ER ROM x 10 each                                    08/07/24  EXERCISE LOG  Exercise Repetitions and Resistance Comments  Upright bike  4 minutes    Static stance on foam   2 minutes  Infrequent UE support   Bridge w/ march   10 reps    Supine hip ADD isometric  10 reps w/ 5 second hold    SLR   2 x 5 reps  LLE only; partial ROM  Manual therapy  STM to left piriformis  Retrograde massage to L hip and thigh For reduced pain and swelling   Blank cell = exercise not performed today   08/04/24: Therapist viewed incision/rash with picture taken (check media)   Media Information  Document Information  Photos  Lt hip incision  08/04/2024 09:54  Attached To:  Outpatient Rehab on 08/04/24 with Renae Augustin PARAS, PTA  Source Information  Renae Augustin PARAS, PTA  [facility]   Discussed edema control strategies Educated s/s with blood clot Benefits of compression garments thigh high  ETI measurements and handout given Supine: Bridge 10x 2 sets 5 PROM of L hip, all planes except adduction and remained within limits of protocol Log rolling Sidelying:  Clam for ER 10x 5 Clam for IR 10x 5 Retrograde massage with LE elevated   Upright bike x 4 min   07/30/24 Manual Therapy: -PROM of L hip, all planes except adduction and remained within limits of protocol Therapeutic Exercise: -Quadruped rock backs - maintaining protocol ROM  limitations - Sit to stand with cues for appropriate form and within protocol limits - Side stepping (no resistance) - Backward walking (no resistance and within pain free) - Step up w/ R LE and down with L LE  07/28/2024  Manual Therapy: -PROM of L hip, all planes except adduction and remained within limits of protocol Therapeutic Exercise: -Standing heel raises, 1 set of 10 reps, pt cued for increased ROM -Hip abduction/adduction isometrics, 1 sets of 10 reps, 5 second holds, pt cued for region of activation (belt and ball utilized) -Glute sets, 2 sets of 10 reps, 3 second holds, pt cued for increased glute activation -Standing hip abductions, 1 set of 5 reps, pt cued for less than 30 degrees of hip abduction -Standing marches, 1  set of 10 reps bilaterally, pt cued for less than 90 degrees of hip flexion -Standing weight shifts to SLS on LLE, 1 set of 10 reps, 3 second holds  PATIENT EDUCATION: Education details: HEP, prognosis, healing, and swelling Person educated: Patient Education method: Explanation, Demonstration, Verbal cues, and Handouts Education comprehension: verbalized understanding, further education recommended   HOME EXERCISE PROGRAM: Access Code: [email or web address] URL: [email or web address] Date: 07/21/2024 Prepared by: Redell Moose  Exercises - Supine Heel Slide with Strap  - 2 x daily - 6 x weekly - 1 sets - 10 reps - Bent Knee Fallouts  - 2 x daily - 6 x weekly - 1 sets - 10 reps - Seated Hip Adduction Isometrics with Ball  - 2 x daily - 6 x weekly - 1 sets - 10 reps - 5 sec hold - Standing March with Counter Support  - 2 x daily - 6 x weekly - 1 sets - 10 reps - Standing Hip Abduction with Counter Support  - 2 x daily - 6 x weekly - 1 sets - 10 reps  ASSESSMENT:  CLINICAL IMPRESSION: Progress note for 10th visit with the following findings:  Pt reports compliance with HEP daily, presents with improved gait cadence and improved time with 5STS.  Pt  continues to present with significant Lt hip weakness, antalgic gait mechanics and impaired balance.  Pt will continues to benefit from skilled intervention to address these deficits per labral repair protocol, will be 5 weeks post-op tomorrow.  Added rockerboard to improve equal weight distribution to improve gait mechanics.  No reports of pain through session.  Eval: Patient referred to PT post op hip labrum repair on left 07/16/24. Overall doing well up to this point and she is WBAT. I reviewed post op precautions with her today and she will follow back up with surgeon next week. Patient will benefit from skilled PT to improve overall function and to address impairments and limitations listed below.  OBJECTIVE IMPAIRMENTS: decreased activity tolerance for ADL's, difficulty walking, decreased balance, decreased endurance, decreased mobility, decreased ROM, decreased strength, impaired flexibility, impaired LE use, and pain.  ACTIVITY LIMITATIONS: bending, liftting, walking, standing, cleaning, community activity, driving,   PERSONAL FACTORS: see above PMH  also affecting patient's functional outcome.  REHAB POTENTIAL: Good  CLINICAL DECISION MAKING: Stable/uncomplicated  EVALUATION COMPLEXITY: Low    GOALS: Short term PT Goals Target date: 08/18/2024   Pt will be I and compliant with HEP. Baseline: Reports compliance with HEP Goal status: in progress  2. Pt will improve 5 times sit to stand test to less than 25 seconds  Baseline 47 sec; 08/19/24: 5STS 11.82  Goal status:in progress Long term PT goals Target date:09/15/2024   Pt will improve left hip AROM to [facility] to improve functional mobility Baseline: Goal status: in progress Pt will improve left LE  strength to at least 4+/5 MMT to improve functional strength Baseline: Goal status: in progress Pt will improve Patient specific functional scale (PSFS) to at least 7/10 to show improved function level Baseline: Improved from 4.5  to 8/10 Goal status: in progress Pt will reduce pain to overall less than 3/10 with usual activity and with ambulating community distances Baseline: 08/19/24:  Average pain scale is 1/10 depending on activity Goal status: in progress Pt will be improve 5 times sit to stand test to less than 13 seconds Baseline:47 sec; 5STS 11.82 Goal status: in progress  PLAN: PT FREQUENCY: 1-2 times per week   PT DURATION: 6-8 weeks  PLANNED INTERVENTIONS  97110-Therapeutic exercises, 97530- Therapeutic activity, [id number]- Neuromuscular re-education, 97535- Self Care, [id number]- Manual therapy, and Patient/Family education  PLAN FOR NEXT SESSION: beginner hip/glute activation and ROM within protocol parameters; focus on correcting limp NEXT MD VISIT: 08/27/24  Augustin Mclean, LPTA/CLT; CBIS [phone]  4:00 PM, 08/19/24

## 2024-08-25 ENCOUNTER — Ambulatory Visit (HOSPITAL_COMMUNITY)

## 2024-08-25 DIAGNOSIS — R600 Localized edema: Secondary | ICD-10-CM

## 2024-08-25 DIAGNOSIS — M25552 Pain in left hip: Secondary | ICD-10-CM | POA: Diagnosis not present

## 2024-08-25 DIAGNOSIS — M6281 Muscle weakness (generalized): Secondary | ICD-10-CM

## 2024-08-25 DIAGNOSIS — R262 Difficulty in walking, not elsewhere classified: Secondary | ICD-10-CM

## 2024-08-25 NOTE — Therapy (Addendum)
 OUTPATIENT PHYSICAL THERAPY TREATMENT     Patient Name: Rhonda Porter MRN: [id number] DOB:15-Jan-1971, 53 y.o., female Today's Date: 08/25/2024  END OF SESSION:  PT End of Session - 08/25/24 1427     Visit Number 11    Number of Visits 15    Date for Recertification  09/15/24    Authorization Type Aetna    Authorization Time Period no auth needed    PT Start Time 1420    PT Stop Time 1500    PT Time Calculation (min) 40 min    Activity Tolerance Patient tolerated treatment well    Behavior During Therapy [facility] for tasks assessed/performed             Past Medical History:  Diagnosis Date   Diverticula of colon    Ganglion cyst    Gastritis    History of colonic polyps    Hypertension    on 03/08/15 pt denies high bp in over 10 yrs    IBS (irritable bowel syndrome)    Melanoma (HCC) 10/29/2004   seasonal allergies    Past Surgical History:  Procedure Laterality Date   ABDOMINAL HYSTERECTOMY  10/29/1997   CARPAL TUNNEL RELEASE Right    COLONOSCOPY  03/08/2015   FOOT SURGERY Bilateral    surg x 3 right, surg x 1 left   GLUTEUS MINIMUS REPAIR Left 07/30/2022   Procedure: LEFT GLUTEUS MEDIUS REPAIR;  Surgeon: Genelle Standing, MD;  Location: [facility] OR;  Service: Orthopedics;  Laterality: Left;   KNEE ARTHROSCOPY Right 10/30/2011   MELANOMA EXCISION  10/29/2004   left shoulder   SPINAL FUSION     UPPER GASTROINTESTINAL ENDOSCOPY  03/08/2015   Dr.Pyrtle   WISDOM TOOTH EXTRACTION     Patient Active Problem List   Diagnosis Date Noted   Tear of left gluteus medius tendon    Lower abdominal pain 04/11/2017   Menopausal symptom 04/11/2017   Pain in pelvis 04/11/2017   LUQ abdominal pain 02/10/2015   Bloating 02/08/2015   Irritable bowel syndrome (IBS) 02/08/2015   Abdominal cramps 02/08/2015   DIVERTICULAR BLEEDING, HX OF 01/18/2009   HYPERTENSION 01/17/2009   History of colonic polyps 01/17/2009    PCP: Dow Longs, PA-C   REFERRING PROVIDER: Genelle Standing, MD Next apt: 08/27/24   REFERRING DIAG: D26.807J (ICD-10-CM) - Tear of left acetabular labrum, initial encounter   Rationale for Evaluation and Treatment:  Rehabiliation  THERAPY DIAG:  Pain in left hip  Muscle weakness (generalized)  Difficulty walking  Localized edema  ONSET DATE: post op hip labrum repair on left 07/16/24 *see protocol under media or on Othell Diluzio L's desk   SUBJECTIVE:  SUBJECTIVE STATEMENT:  Hip is still swollen; sees the surgeon on Thurs.  Will be s/p 6 weeks on Thursday 10/30   EVAL:She had hip labral repair. Since she is now doing okay, WBAT. She did take meds before coming.   PERTINENT HISTORY:  See above PMH Had a left glute med repair 10/23 per [facility]  PAIN:  NPRS scale: 4/10 upon arrival Pain location:left hip around incision Pain description: constant sore Aggravating factors: standing too long Relieving factors: rest, meds, ice   PRECAUTIONS: ,  Hip post op protocol- [email or web address]   RED FLAGS: None   WEIGHT BEARING RESTRICTIONS:  WBAT  FALLS:  Has patient fallen in last 6 months? 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  PATIENT SURVEYS:  Patient-Specific Activity Scoring Scheme  0 represents "unable to perform." 10 represents "able to perform at prior level. 0 1 2 3 4 5 6 7 8 9  10 (Date and Score)   Activity Eval  08/19/24:   1. walking 3  7  2. Getting in/out of bed  6 9   3.     4.    5.     Score 4.5 8   Total score = sum of the activity scores/number of activities Minimum detectable change (90%CI) for average score = 2 points Minimum detectable change (90%CI) for single activity score = 3 points     EDEMA:  Yes: in left hip  GAIT: Eval: Assistive device utilized: None Level of assistance: Complete Independence Comments: slow ambulation, shorter steps, antalgic gait, limited distances    LOWER EXTREMITY ROM:     Active /PROM Left eval   Hip flexion 70/90   Hip extension    Hip abduction 20/30   Hip adduction    Hip internal rotation /30   Hip external rotation /30   Knee flexion    Knee extension    Ankle dorsiflexion    Ankle plantarflexion    Ankle inversion    Ankle eversion     (Blank rows = not tested)   LOWER EXTREMITY MMT:    MMT Left Eval Left  08/19/24 Right 08/19/24  Hip flexion 2 Not tested   Hip extension  2+ limited range 4/5  Hip abduction 2 2+ 4+  Hip adduction     Hip internal rotation 2    Hip external rotation 2    Knee flexion 3 3+ 5  Knee extension 3 3+ 4+  Ankle dorsiflexion  4   Ankle plantarflexion     Ankle inversion     Ankle eversion      (Blank rows = not tested)    FUNCTIONAL TESTS:  Eval: 5 times sit to stand: 47 seconds 353 no AD  TREATMENT DATE:  08/25/24 STM to left thigh and hip x 15' to decrease pain and swelling Bike upright x 5' 30 sec left sls 3 x 30 (intermittent hand held assist) Tandem stance 2 x 30 each Walking with increased toe off left leg   08/19/24: 371ft no AD 5STS 11.82 PSFS see above MMT see above Prone: hip extension 10x Lt side only Rockerboard lateral then DF/PF 2 min each  08/17/24 Upright bike x 5' dynamic warm up Heel raises on incline 2 x 10 Toe raises on decline 2 x 10 Slant board 5 x 20 Discussion of no pain with hip  flexion/mini squat Prone hip extension x 10 Prone IR/ER x 10 each  08/13/24 Upright bike x 5' dynamic warm up Backwards walking on treadmill x 3' at  .6 mph Sidestepping 30 ft line down and back x 3 with yellow band around ankles Quadruped rocking slow 2 x 10 Quadruped alternating arms 2 x 10 Sidelying clam with yellow band 2 x 10 Bridge with yellow band 2 x 10 Updated HEP  08/11/24 Upright bike x 5' dynamic warm up Mini squats x 10 Bridge 5 x 10 Single leg bridge 5 x 10 TA contraction with bent knee fall outs x 10 Clam 2 x 10 Prone hamstring curls x 10 Prone IR/ER ROM x 10 each                                    08/07/24  EXERCISE LOG  Exercise Repetitions and Resistance Comments  Upright bike  4 minutes    Static stance on foam   2 minutes  Infrequent UE support   Bridge w/ march   10 reps    Supine hip ADD isometric  10 reps w/ 5 second hold    SLR   2 x 5 reps  LLE only; partial ROM  Manual therapy  STM to left piriformis  Retrograde massage to L hip and thigh For reduced pain and swelling   Blank cell = exercise not performed today   08/04/24: Therapist viewed incision/rash with picture taken (check media)   Media Information  Document Information  Photos  Lt hip incision  08/04/2024 09:54  Attached To:  Outpatient Rehab on 08/04/24 with Renae Augustin PARAS, PTA  Source Information  Renae Augustin PARAS, PTA  [facility]   Discussed edema control strategies Educated s/s with blood clot Benefits of compression garments thigh high  ETI measurements and handout given Supine: Bridge 10x 2 sets 5 PROM of L hip, all planes except adduction and remained within limits of protocol Log rolling Sidelying:  Clam for ER 10x 5 Clam for IR 10x 5 Retrograde massage with LE elevated   Upright bike x 4 min   07/30/24 Manual Therapy: -PROM of L hip, all planes except adduction and remained within limits of protocol Therapeutic Exercise: -Quadruped  rock backs - maintaining protocol ROM limitations - Sit to stand with cues for appropriate form and within protocol limits - Side stepping (no resistance) - Backward walking (no resistance and within pain free) - Step up w/ R LE and down with L LE  07/28/2024  Manual Therapy: -PROM of L hip, all planes except adduction and remained within limits of protocol Therapeutic Exercise: -Standing heel raises, 1 set of 10 reps, pt cued for increased ROM -Hip abduction/adduction isometrics, 1 sets of 10 reps, 5 second holds, pt cued for  region of activation (belt and ball utilized) -Glute sets, 2 sets of 10 reps, 3 second holds, pt cued for increased glute activation -Standing hip abductions, 1 set of 5 reps, pt cued for less than 30 degrees of hip abduction -Standing marches, 1 set of 10 reps bilaterally, pt cued for less than 90 degrees of hip flexion -Standing weight shifts to SLS on LLE, 1 set of 10 reps, 3 second holds  PATIENT EDUCATION: Education details: HEP, prognosis, healing, and swelling Person educated: Patient Education method: Explanation, Demonstration, Verbal cues, and Handouts Education comprehension: verbalized understanding, further education recommended   HOME EXERCISE PROGRAM: Access Code: [email or web address] URL: [email or web address] Date: 07/21/2024 Prepared by: Redell Moose  Exercises - Supine Heel Slide with Strap  - 2 x daily - 6 x weekly - 1 sets - 10 reps - Bent Knee Fallouts  - 2 x daily - 6 x weekly - 1 sets - 10 reps - Seated Hip Adduction Isometrics with Ball  - 2 x daily - 6 x weekly - 1 sets - 10 reps - 5 sec hold - Standing March with Counter Support  - 2 x daily - 6 x weekly - 1 sets - 10 reps - Standing Hip Abduction with Counter Support  - 2 x daily - 6 x weekly - 1 sets - 10 reps  ASSESSMENT:  CLINICAL IMPRESSION: Continued swelling left leg/thigh and hip so STM to encourage blood flow to start treatment.  Continued with exercise per protocol.   Patient with continued slight decreased time on left leg with stance phase so encouraged increased toe off time and she does improve with verbal cues.  Unable to hold SLS a full 30 sec so modified to tandem stance which she performs without use of Ue's to assist.  Patient will benefit from continued skilled therapy services to address deficits and promote return to optimal function.      Eval: Patient referred to PT post op hip labrum repair on left 07/16/24. Overall doing well up to this point and she is WBAT. I reviewed post op precautions with her today and she will follow back up with surgeon next week. Patient will benefit from skilled PT to improve overall function and to address impairments and limitations listed below.  OBJECTIVE IMPAIRMENTS: decreased activity tolerance for ADL's, difficulty walking, decreased balance, decreased endurance, decreased mobility, decreased ROM, decreased strength, impaired flexibility, impaired LE use, and pain.  ACTIVITY LIMITATIONS: bending, liftting, walking, standing, cleaning, community activity, driving,   PERSONAL FACTORS: see above PMH  also affecting patient's functional outcome.  REHAB POTENTIAL: Good  CLINICAL DECISION MAKING: Stable/uncomplicated  EVALUATION COMPLEXITY: Low    GOALS: Short term PT Goals Target date: 08/18/2024   Pt will be I and compliant with HEP. Baseline: Reports compliance with HEP Goal status: in progress  2. Pt will improve 5 times sit to stand test to less than 25 seconds  Baseline 47 sec; 08/19/24: 5STS 11.82  Goal status:in progress Long term PT goals Target date:09/15/2024   Pt will improve left hip AROM to [facility] to improve functional mobility Baseline: Goal status: in progress Pt will improve left LE  strength to at least 4+/5 MMT to improve functional strength Baseline: Goal status: in progress Pt will improve Patient specific functional scale (PSFS) to at least 7/10 to show improved function  level Baseline: Improved from 4.5 to 8/10 Goal status: in progress Pt will reduce pain to overall less than 3/10 with usual activity and with ambulating community distances  Baseline: 08/19/24:  Average pain scale is 1/10 depending on activity Goal status: in progress Pt will be improve 5 times sit to stand test to less than 13 seconds Baseline:47 sec; 5STS 11.82 Goal status: in progress  PLAN: PT FREQUENCY: 1-2 times per week   PT DURATION: 6-8 weeks  PLANNED INTERVENTIONS  97110-Therapeutic exercises, 97530- Therapeutic activity, 97112- Neuromuscular re-education, 97535- Self Care, [id number]- Manual therapy, and Patient/Family education  PLAN FOR NEXT SESSION: beginner hip/glute activation and ROM within protocol parameters; focus on correcting limp NEXT MD VISIT: 08/27/24  3:02 PM, 08/25/24 Gavin Telford Small Florice Hindle MPT [place] [facility] [place] [phone] Ey:[phone]

## 2024-08-27 ENCOUNTER — Ambulatory Visit (INDEPENDENT_AMBULATORY_CARE_PROVIDER_SITE_OTHER): Admitting: Orthopaedic Surgery

## 2024-08-27 DIAGNOSIS — S73192A Other sprain of left hip, initial encounter: Secondary | ICD-10-CM

## 2024-08-27 NOTE — Progress Notes (Signed)
 Post Operative Evaluation    Procedure/Date of Surgery: Left hip labral repair 9/18  Interval History:   Presents today 6 weeks status post the above procedure.  Overall she is continuing to improve and her range of motion and strength are coming along nicely PMH/PSH/Family History/Social History/Meds/Allergies:    Past Medical History:  Diagnosis Date   Diverticula of colon    Ganglion cyst    Gastritis    History of colonic polyps    Hypertension    on 03/08/15 pt denies high bp in over 10 yrs    IBS (irritable bowel syndrome)    Melanoma (HCC) 10/29/2004   seasonal allergies    Past Surgical History:  Procedure Laterality Date   ABDOMINAL HYSTERECTOMY  10/29/1997   CARPAL TUNNEL RELEASE Right    COLONOSCOPY  03/08/2015   FOOT SURGERY Bilateral    surg x 3 right, surg x 1 left   GLUTEUS MINIMUS REPAIR Left 07/30/2022   Procedure: LEFT GLUTEUS MEDIUS REPAIR;  Surgeon: Genelle Standing, MD;  Location: [facility] OR;  Service: Orthopedics;  Laterality: Left;   KNEE ARTHROSCOPY Right 10/30/2011   MELANOMA EXCISION  10/29/2004   left shoulder   SPINAL FUSION     UPPER GASTROINTESTINAL ENDOSCOPY  03/08/2015   Dr.Pyrtle   WISDOM TOOTH EXTRACTION     Social History   Socioeconomic History   Marital status: Married    Spouse name: Not on file   Number of children: 2   Years of education: Not on file   Highest education level: Not on file  Occupational History   Occupation: Radio Broadcast Assistant: [employer]  Tobacco Use   Smoking status: Every Day    Current packs/day: 1.00    Average packs/day: 1 pack/day for 34.0 years (34.0 ttl pk-yrs)    Types: Cigarettes   Smokeless tobacco: Never   Tobacco comments:    Quit at age 66yr  Vaping Use   Vaping status: Never Used  Substance and Sexual Activity   Alcohol use: Not Currently    Comment: once a month per pt   Drug use: No   Sexual activity: Yes    Birth control/protection:  Surgical    Comment: Hysterectomy  Other Topics Concern   Not on file  Social History Narrative   Not on file   Social Drivers of Health   Financial Resource Strain: Not on file  Food Insecurity: Not on file  Transportation Needs: Not on file  Physical Activity: Not on file  Stress: Not on file  Social Connections: Not on file   Family History  Problem Relation Age of Onset   Colon polyps Mother    Hypertension Mother    Leukemia Maternal Grandmother    Lymphoma Maternal Grandmother    Colon cancer Neg Hx    Esophageal cancer Neg Hx    Rectal cancer Neg Hx    Stomach cancer Neg Hx    Allergies  Allergen Reactions   Cetirizine Hcl Other (See Comments)    Not effective   Hydrocodone Itching    Have hypersensitivity to most pain meds   Other    Cephalexin Rash   Penicillins Rash   Sulfa Antibiotics Rash   Current Outpatient Medications  Medication Sig Dispense Refill   ondansetron  (ZOFRAN ) 4  MG tablet Take 1 tablet (4 mg total) by mouth daily as needed for nausea or vomiting. 30 tablet 1   Ascorbic Acid (VITAMIN C PO) Take 500 mg by mouth daily.     Cholecalciferol (VITAMIN D-3) 125 MCG (5000 UT) TABS Take by mouth.     dicyclomine  (BENTYL ) 20 MG tablet Take 1 tablet (20 mg total) by mouth daily as needed for spasms. 90 tablet 3   ELDERBERRY PO Take 50 mg by mouth daily. Gummy     famotidine  (PEPCID ) 20 MG tablet Take 1 tablet (20 mg total) by mouth at bedtime as needed for heartburn or indigestion. 90 tablet 3   fluticasone (FLONASE) 50 MCG/ACT nasal spray Place 1 spray into both nostrils daily as needed for allergies.     HYDROmorphone  (DILAUDID ) 2 MG tablet Take 1 tablet (2 mg total) by mouth every 4 (four) hours as needed for severe pain (pain score 7-10). 20 tablet 0   levocetirizine (XYZAL) 5 MG tablet Take 5 mg by mouth every evening.     losartan-hydrochlorothiazide (HYZAAR) 50-12.5 MG tablet Take 1 tablet by mouth daily.     pantoprazole  (PROTONIX ) 40 MG tablet  TAKE ONE TABLET BY MOUTH EVERY DAY 90 tablet 1   saccharomyces boulardii (FLORASTOR) 250 MG capsule Take 500 mg by mouth daily.     vitamin E 180 MG (400 UNITS) capsule Take 400 Units by mouth daily.     No current facility-administered medications for this visit.   No results found.  Review of Systems:   A ROS was performed including pertinent positives and negatives as documented in the HPI.   Musculoskeletal Exam:    There were no vitals taken for this visit.  Left hip is well-healed.  Sensation is intact in all distributions.  30 degrees internal/external rotation without pain.  Distal neurosensory exam is intact Imaging:     I personally reviewed and interpreted the radiographs.   Assessment:   6 weeks status post left hip labral repair doing very well.  Overall she is doing extremely well.  She may be full activity as tolerated Plan :    - Return to clinic as needed   I personally saw and evaluated the patient, and participated in the management and treatment plan.  Elspeth Parker, MD Attending Physician, Orthopedic Surgery  This document was dictated using Dragon voice recognition software. A reasonable attempt at proof reading has been made to minimize errors.

## 2024-08-31 ENCOUNTER — Encounter: Payer: Self-pay | Admitting: Radiology

## 2024-09-01 ENCOUNTER — Ambulatory Visit (HOSPITAL_COMMUNITY): Attending: Orthopaedic Surgery

## 2024-09-01 DIAGNOSIS — R262 Difficulty in walking, not elsewhere classified: Secondary | ICD-10-CM | POA: Diagnosis present

## 2024-09-01 DIAGNOSIS — R600 Localized edema: Secondary | ICD-10-CM | POA: Diagnosis present

## 2024-09-01 DIAGNOSIS — M6281 Muscle weakness (generalized): Secondary | ICD-10-CM | POA: Insufficient documentation

## 2024-09-01 DIAGNOSIS — M25552 Pain in left hip: Secondary | ICD-10-CM | POA: Insufficient documentation

## 2024-09-01 NOTE — Therapy (Signed)
 OUTPATIENT PHYSICAL THERAPY TREATMENT     Patient Name: Rhonda Porter MRN: [id number] DOB:September 11, 1971, 53 y.o., female Today's Date: 09/01/2024  END OF SESSION:  PT End of Session - 09/01/24 0902     Visit Number 12    Number of Visits 15    Date for Recertification  09/15/24    Authorization Type [employer]    Authorization Time Period no auth needed    PT Start Time 0902    PT Stop Time 0942    PT Time Calculation (min) 40 min    Activity Tolerance Patient tolerated treatment well    Behavior During Therapy [facility] for tasks assessed/performed             Past Medical History:  Diagnosis Date   Diverticula of colon    Ganglion cyst    Gastritis    History of colonic polyps    Hypertension    on 03/08/15 pt denies high bp in over 10 yrs    IBS (irritable bowel syndrome)    Melanoma (HCC) 10/29/2004   seasonal allergies    Past Surgical History:  Procedure Laterality Date   ABDOMINAL HYSTERECTOMY  10/29/1997   CARPAL TUNNEL RELEASE Right    COLONOSCOPY  03/08/2015   FOOT SURGERY Bilateral    surg x 3 right, surg x 1 left   GLUTEUS MINIMUS REPAIR Left 07/30/2022   Procedure: LEFT GLUTEUS MEDIUS REPAIR;  Surgeon: Genelle Standing, MD;  Location: [facility] OR;  Service: Orthopedics;  Laterality: Left;   KNEE ARTHROSCOPY Right 10/30/2011   MELANOMA EXCISION  10/29/2004   left shoulder   SPINAL FUSION     UPPER GASTROINTESTINAL ENDOSCOPY  03/08/2015   Dr.Pyrtle   WISDOM TOOTH EXTRACTION     Patient Active Problem List   Diagnosis Date Noted   Tear of left gluteus medius tendon    Lower abdominal pain 04/11/2017   Menopausal symptom 04/11/2017   Pain in pelvis 04/11/2017   LUQ abdominal pain 02/10/2015   Bloating 02/08/2015   Irritable bowel syndrome (IBS) 02/08/2015   Abdominal cramps 02/08/2015   DIVERTICULAR BLEEDING, HX OF 01/18/2009   HYPERTENSION 01/17/2009   History of colonic polyps 01/17/2009    PCP: Dow Longs, PA-C   REFERRING PROVIDER: Genelle Standing,  MD Next apt: 08/27/24   REFERRING DIAG: D26.807J (ICD-10-CM) - Tear of left acetabular labrum, initial encounter   Rationale for Evaluation and Treatment:  Rehabiliation  THERAPY DIAG:  Pain in left hip  Muscle weakness (generalized)  Difficulty walking  Localized edema  ONSET DATE: post op hip labrum repair on left 07/16/24 *see protocol under media or on Sinjin Amero L's desk   SUBJECTIVE:  SUBJECTIVE STATEMENT: *per Dr. Genelle she is free of restrictions* Patient reports no pain today.  She is doing more at home without issue  EVAL:She had hip labral repair. Since she is now doing okay, WBAT. She did take meds before coming.   PERTINENT HISTORY:  See above PMH Had a left glute med repair 10/23 per [facility]  PAIN:  NPRS scale: 4/10 upon arrival Pain location:left hip around incision Pain description: constant sore Aggravating factors: standing too long Relieving factors: rest, meds, ice   PRECAUTIONS: ,  Hip post op protocol- [email or web address]   RED FLAGS: None   WEIGHT BEARING RESTRICTIONS:  WBAT  FALLS:  Has patient fallen in last 6 months? 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  PATIENT SURVEYS:  Patient-Specific Activity Scoring Scheme  0 represents "unable to perform." 10 represents "able to perform at prior level. 0 1 2 3 4 5 6 7 8 9  10 (Date and Score)   Activity Eval  08/19/24:   1. walking 3  7  2. Getting in/out of bed  6 9    3.     4.    5.    Score 4.5 8   Total score = sum of the activity scores/number of activities Minimum detectable change (90%CI) for average score = 2 points Minimum detectable change (90%CI) for single activity score = 3 points     EDEMA:  Yes: in left hip  GAIT: Eval: Assistive device utilized: None Level of assistance: Complete Independence Comments: slow ambulation, shorter steps, antalgic gait, limited distances    LOWER EXTREMITY ROM:     Active /PROM Left eval   Hip flexion 70/90   Hip extension    Hip abduction 20/30   Hip adduction    Hip internal rotation /30   Hip external rotation /30   Knee flexion    Knee extension    Ankle dorsiflexion    Ankle plantarflexion    Ankle inversion    Ankle eversion     (Blank rows = not tested)   LOWER EXTREMITY MMT:    MMT Left Eval Left  08/19/24 Right 08/19/24  Hip flexion 2 Not tested   Hip extension  2+ limited range 4/5  Hip abduction 2 2+ 4+  Hip adduction     Hip internal rotation 2    Hip external rotation 2    Knee flexion 3 3+ 5  Knee extension 3 3+ 4+  Ankle dorsiflexion  4   Ankle plantarflexion     Ankle inversion     Ankle eversion      (Blank rows = not tested)    FUNCTIONAL TESTS:  Eval: 5 times sit to stand: 47 seconds 353 no AD  TREATMENT DATE:  09/01/24 Treadmill up to 1.7 mph with 3.0 grade x 5' dynamic warm up Foam beam tandem balance 2 x 30 Calf raises on incline 2 x 10 Toe raises on decline 2 x 10 Slant board 5 x 20 6 step ups 2 x 10 Squat to chair with green theraband around knees 2 x 10     08/25/24 STM to left thigh and hip x 15' to decrease pain and swelling Bike upright x 5' 30 sec left sls 3 x 30 (intermittent hand held assist) Tandem stance 2 x 30 each Walking with increased toe off left leg   08/19/24: 361ft no  AD 5STS 11.82 PSFS see above MMT see above Prone: hip extension 10x Lt side only Rockerboard lateral then DF/PF 2 min each  08/17/24 Upright bike x 5' dynamic warm up Heel raises on incline 2 x 10 Toe raises on decline 2 x 10 Slant board 5 x 20 Discussion of no pain with hip flexion/mini squat Prone hip extension x 10 Prone IR/ER x 10 each  08/13/24 Upright bike x 5' dynamic warm up Backwards walking on treadmill x 3' at  .6 mph Sidestepping 30 ft line down and back x 3 with yellow band around ankles Quadruped rocking slow 2 x 10 Quadruped alternating arms 2 x 10 Sidelying clam with yellow band 2 x 10 Bridge with yellow band 2 x 10 Updated HEP  08/11/24 Upright bike x 5' dynamic warm up Mini squats x 10 Bridge 5 x 10 Single leg bridge 5 x 10 TA contraction with bent knee fall outs x 10 Clam 2 x 10 Prone hamstring curls x 10 Prone IR/ER ROM x 10 each                                    08/07/24  EXERCISE LOG  Exercise Repetitions and Resistance Comments  Upright bike  4 minutes    Static stance on foam   2 minutes  Infrequent UE support   Bridge w/ march   10 reps    Supine hip ADD isometric  10 reps w/ 5 second hold    SLR   2 x 5 reps  LLE only; partial ROM  Manual therapy  STM to left piriformis  Retrograde massage to L hip and thigh For reduced pain and swelling   Blank cell = exercise not performed today   08/04/24: Therapist viewed incision/rash with picture taken (check media)   Media Information  Document Information  Photos  Lt hip incision  08/04/2024 09:54  Attached To:  Outpatient Rehab on 08/04/24 with Renae Augustin PARAS, PTA  Source Information  Renae Augustin PARAS, PTA  [facility]   Discussed edema control strategies Educated s/s with blood clot Benefits of compression garments thigh high  ETI measurements and handout given Supine: Bridge 10x 2 sets 5 PROM of L hip, all planes except adduction and remained within limits  of protocol Log rolling Sidelying:  Clam for ER 10x 5 Clam for IR 10x 5 Retrograde massage with LE elevated   Upright bike x 4 min   07/30/24 Manual Therapy: -PROM of L hip, all planes except adduction and remained within limits of protocol Therapeutic Exercise: -Quadruped rock backs - maintaining protocol ROM limitations - Sit to stand with cues for appropriate form and within protocol limits - Side stepping (no resistance) - Backward walking (no resistance and within  pain free) - Step up w/ R LE and down with L LE  07/28/2024  Manual Therapy: -PROM of L hip, all planes except adduction and remained within limits of protocol Therapeutic Exercise: -Standing heel raises, 1 set of 10 reps, pt cued for increased ROM -Hip abduction/adduction isometrics, 1 sets of 10 reps, 5 second holds, pt cued for region of activation (belt and ball utilized) -Glute sets, 2 sets of 10 reps, 3 second holds, pt cued for increased glute activation -Standing hip abductions, 1 set of 5 reps, pt cued for less than 30 degrees of hip abduction -Standing marches, 1 set of 10 reps bilaterally, pt cued for less than 90 degrees of hip flexion -Standing weight shifts to SLS on LLE, 1 set of 10 reps, 3 second holds  PATIENT EDUCATION: Education details: HEP, prognosis, healing, and swelling Person educated: Patient Education method: Explanation, Demonstration, Verbal cues, and Handouts Education comprehension: verbalized understanding, further education recommended   HOME EXERCISE PROGRAM: Access Code: [email or web address] URL: [email or web address] Date: 07/21/2024 Prepared by: Redell Moose  Exercises - Supine Heel Slide with Strap  - 2 x daily - 6 x weekly - 1 sets - 10 reps - Bent Knee Fallouts  - 2 x daily - 6 x weekly - 1 sets - 10 reps - Seated Hip Adduction Isometrics with Ball  - 2 x daily - 6 x weekly - 1 sets - 10 reps - 5 sec hold - Standing March with Counter Support  - 2 x daily - 6 x  weekly - 1 sets - 10 reps - Standing Hip Abduction with Counter Support  - 2 x daily - 6 x weekly - 1 sets - 10 reps  ASSESSMENT:  CLINICAL IMPRESSION: Per Anxdyjw patient is clear for all activity as long as she is not painful.  Continued with lower extremity strengtheing and balance activities; added step ups today with patient able to perform without UE assist.  Minimal limp remains but improves with cuesin   Patient will benefit from continued skilled therapy services to address deficits and promote return to optimal function.      Eval: Patient referred to PT post op hip labrum repair on left 07/16/24. Overall doing well up to this point and she is WBAT. I reviewed post op precautions with her today and she will follow back up with surgeon next week. Patient will benefit from skilled PT to improve overall function and to address impairments and limitations listed below.  OBJECTIVE IMPAIRMENTS: decreased activity tolerance for ADL's, difficulty walking, decreased balance, decreased endurance, decreased mobility, decreased ROM, decreased strength, impaired flexibility, impaired LE use, and pain.  ACTIVITY LIMITATIONS: bending, liftting, walking, standing, cleaning, community activity, driving,   PERSONAL FACTORS: see above PMH  also affecting patient's functional outcome.  REHAB POTENTIAL: Good  CLINICAL DECISION MAKING: Stable/uncomplicated  EVALUATION COMPLEXITY: Low    GOALS: Short term PT Goals Target date: 08/18/2024   Pt will be I and compliant with HEP. Baseline: Reports compliance with HEP Goal status: in progress  2. Pt will improve 5 times sit to stand test to less than 25 seconds  Baseline 47 sec; 08/19/24: 5STS 11.82  Goal status:in progress Long term PT goals Target date:09/15/2024   Pt will improve left hip AROM to [facility] to improve functional mobility Baseline: Goal status: in progress Pt will improve left LE  strength to at least 4+/5 MMT to improve functional  strength Baseline: Goal status: in progress Pt will improve Patient specific functional scale (PSFS)  to at least 7/10 to show improved function level Baseline: Improved from 4.5 to 8/10 Goal status: in progress Pt will reduce pain to overall less than 3/10 with usual activity and with ambulating community distances Baseline: 08/19/24:  Average pain scale is 1/10 depending on activity Goal status: in progress Pt will be improve 5 times sit to stand test to less than 13 seconds Baseline:47 sec; 5STS 11.82 Goal status: in progress  PLAN: PT FREQUENCY: 1-2 times per week   PT DURATION: 6-8 weeks  PLANNED INTERVENTIONS  97110-Therapeutic exercises, 97530- Therapeutic activity, 97112- Neuromuscular re-education, 97535- Self Care, [id number]- Manual therapy, and Patient/Family education  PLAN FOR NEXT SESSION: add plank next visit; per Dr. Genelle patient is clear for all activity that is painfree; may be ready for discharge early.  NEXT MD VISIT: 08/27/24  9:47 AM, 09/01/24 Netanya Yazdani Small Jullia Mulligan MPT [place] [place] [phone]

## 2024-09-09 ENCOUNTER — Encounter (HOSPITAL_COMMUNITY): Payer: Self-pay

## 2024-09-09 ENCOUNTER — Ambulatory Visit (HOSPITAL_COMMUNITY)

## 2024-09-09 DIAGNOSIS — M6281 Muscle weakness (generalized): Secondary | ICD-10-CM

## 2024-09-09 DIAGNOSIS — R262 Difficulty in walking, not elsewhere classified: Secondary | ICD-10-CM

## 2024-09-09 DIAGNOSIS — M25552 Pain in left hip: Secondary | ICD-10-CM | POA: Diagnosis not present

## 2024-09-09 DIAGNOSIS — R600 Localized edema: Secondary | ICD-10-CM

## 2024-09-09 NOTE — Therapy (Addendum)
 OUTPATIENT PHYSICAL THERAPY TREATMENT     Patient Name: Rhonda Porter MRN: [id number] DOB:Jul 19, 1971, 53 y.o., female Today's Date: 09/09/2024  END OF SESSION:  PT End of Session - 09/09/24 1501     Visit Number 13    Number of Visits 15    Date for Recertification  09/15/24    Authorization Type Aetna    Authorization Time Period no auth needed    PT Start Time 1502           09/09/24 1501  PT Visits / Re-Eval  Visit Number 13  Number of Visits 15  Date for Recertification  09/15/24  Authorization  Authorization Type Aetna  Authorization Time Period no auth needed  PT Time Calculation  PT Start Time 1502  PT Stop Time 1543  PT Time Calculation (min) 41 min  PT - End of Session  Activity Tolerance Patient tolerated treatment well  Behavior During Therapy WFL for tasks assessed/performed      Past Medical History:  Diagnosis Date   Diverticula of colon    Ganglion cyst    Gastritis    History of colonic polyps    Hypertension    on 03/08/15 pt denies high bp in over 10 yrs    IBS (irritable bowel syndrome)    Melanoma (HCC) 10/29/2004   seasonal allergies    Past Surgical History:  Procedure Laterality Date   ABDOMINAL HYSTERECTOMY  10/29/1997   CARPAL TUNNEL RELEASE Right    COLONOSCOPY  03/08/2015   FOOT SURGERY Bilateral    surg x 3 right, surg x 1 left   GLUTEUS MINIMUS REPAIR Left 07/30/2022   Procedure: LEFT GLUTEUS MEDIUS REPAIR;  Surgeon: Genelle Standing, MD;  Location: [facility] OR;  Service: Orthopedics;  Laterality: Left;   KNEE ARTHROSCOPY Right 10/30/2011   MELANOMA EXCISION  10/29/2004   left shoulder   SPINAL FUSION     UPPER GASTROINTESTINAL ENDOSCOPY  03/08/2015   Dr.Pyrtle   WISDOM TOOTH EXTRACTION     Patient Active Problem List   Diagnosis Date Noted   Tear of left gluteus medius tendon    Lower abdominal pain 04/11/2017   Menopausal symptom 04/11/2017   Pain in pelvis 04/11/2017   LUQ abdominal pain 02/10/2015   Bloating  02/08/2015   Irritable bowel syndrome (IBS) 02/08/2015   Abdominal cramps 02/08/2015   DIVERTICULAR BLEEDING, HX OF 01/18/2009   HYPERTENSION 01/17/2009   History of colonic polyps 01/17/2009    PCP: Dow Longs, PA-C   REFERRING PROVIDER: Genelle Standing, MD Next apt: 08/27/24   REFERRING DIAG: D26.807J (ICD-10-CM) - Tear of left acetabular labrum, initial encounter   Rationale for Evaluation and Treatment:  Rehabiliation  THERAPY DIAG:  Pain in left hip  Muscle weakness (generalized)  Difficulty walking  Localized edema  ONSET DATE: post op hip labrum repair on left 07/16/24 *see protocol under media or on Amy L's desk   SUBJECTIVE:  SUBJECTIVE STATEMENT: *per Dr. Genelle she is free of restrictions* 09/08/24:  No reports of pain.  Continues to have swelling on upper hip.  Doctor does not seem concerned.  EVAL:She had hip labral repair. Since she is now doing okay, WBAT. She did take meds before coming.   PERTINENT HISTORY:  See above PMH Had a left glute med repair 10/23 per Bokshan  PAIN:  NPRS scale: 4/10 upon arrival Pain location:left hip around incision Pain description: constant sore Aggravating factors: standing too long Relieving factors: rest, meds, ice   PRECAUTIONS: ,  Hip post op protocol- [email or web address]   RED FLAGS: None   WEIGHT BEARING RESTRICTIONS:  WBAT  FALLS:  Has patient fallen in last 6 months? No   OCCUPATION:  Advertising account planner, works from home  PLOF:  Independent  PATIENT GOALS:  Reduce pain, walk normal  OBJECTIVE:  Note: Objective measures were completed at  Evaluation unless otherwise noted.  PATIENT SURVEYS:  Patient-Specific Activity Scoring Scheme  0 represents "unable to perform." 10 represents "able to perform at prior level. 0 1 2 3 4 5 6 7 8 9  10 (Date and Score)   Activity Eval  08/19/24:   1. walking 3  7  2. Getting in/out of bed  6 9   3.     4.    5.    Score 4.5 8   Total score = sum of the activity scores/number of activities Minimum detectable change (90%CI) for average score = 2 points Minimum detectable change (90%CI) for single activity score = 3 points     EDEMA:  Yes: in left hip  GAIT: Eval: Assistive device utilized: None Level of assistance: Complete Independence Comments: slow ambulation, shorter steps, antalgic gait, limited distances    LOWER EXTREMITY ROM:     Active /PROM Left eval   Hip flexion 70/90   Hip extension    Hip abduction 20/30   Hip adduction    Hip internal rotation /30   Hip external rotation /30   Knee flexion    Knee extension    Ankle dorsiflexion    Ankle plantarflexion    Ankle inversion    Ankle eversion     (Blank rows = not tested)   LOWER EXTREMITY MMT:    MMT Left Eval Left  08/19/24 Right 08/19/24  Hip flexion 2 Not tested   Hip extension  2+ limited range 4/5  Hip abduction 2 2+ 4+  Hip adduction     Hip internal rotation 2    Hip external rotation 2    Knee flexion 3 3+ 5  Knee extension 3 3+ 4+  Ankle dorsiflexion  4   Ankle plantarflexion     Ankle inversion     Ankle eversion      (Blank rows = not tested)    FUNCTIONAL TESTS:  Eval: 5 times sit to stand: 47 seconds 353 no AD  TREATMENT DATE:  09/08/24: Treadmill up to 1.8 spm, grade 3 incline x 5' Retro  .6--> 1.1 speed x 4' Squat chair tapping 2x 10 SLS Rt 11; Lt 33 Vector stance 3x 5 7in reciprocal pattern no HHA Leg press 3Pl 2x 10 Plank  3x 10 cueing for mechanics and form- difficult  09/01/24 Treadmill up to 1.7 mph with 3.0 grade x 5' dynamic warm up Foam beam tandem balance 2 x 30 Calf raises on incline 2 x 10 Toe raises on decline 2 x 10 Slant board 5 x 20 6 step ups 2 x 10 Squat to chair with green theraband around knees 2 x 10     08/25/24 STM to left thigh and hip x 15' to decrease pain and swelling Bike upright x 5' 30 sec left sls 3 x 30 (intermittent hand held assist) Tandem stance 2 x 30 each Walking with increased toe off left leg    PATIENT EDUCATION: Education details: HEP, prognosis, healing, and swelling Person educated: Patient Education method: Explanation, Demonstration, Verbal cues, and Handouts Education comprehension: verbalized understanding, further education recommended   HOME EXERCISE PROGRAM: Access Code: [email or web address] URL: [email or web address] Date: 07/21/2024 Prepared by: Redell Moose  Exercises - Supine Heel Slide with Strap  - 2 x daily - 6 x weekly - 1 sets - 10 reps - Bent Knee Fallouts  - 2 x daily - 6 x weekly - 1 sets - 10 reps - Seated Hip Adduction Isometrics with Ball  - 2 x daily - 6 x weekly - 1 sets - 10 reps - 5 sec hold - Standing March with Counter Support  - 2 x daily - 6 x weekly - 1 sets - 10 reps - Standing Hip Abduction with Counter Support  - 2 x daily - 6 x weekly - 1 sets - 10 reps  Access Code: [email or web address] URL: [email or web address] Date: 09/09/2024 Prepared by: Augustin Mclean  Exercises - Supine Heel Slide with Strap  - 2 x daily - 6 x weekly - 1 sets - 10 reps - Bent Knee Fallouts  - 2 x daily - 6 x weekly - 1 sets - 10 reps - Standing Hip Abduction with Counter Support  - 2 x daily - 6 x weekly - 1 sets - 10 reps - Supine Transversus Abdominis Bracing - Hands on Stomach  - 1 x daily - 7 x weekly - 1 sets - 10 reps - 5 sec hold - Sidelying Reverse Clamshell  - 2 x daily - 7 x weekly - 3 sets - 10 reps - 5 hold -  Mini Squat with Chair  - 2 x daily - 7 x weekly - 1 sets - 10 reps - Prone Hip Internal and External Rotation AROM  - 1 x daily - 7 x weekly - 1 sets - 10 reps - Supine Bridge with Resistance Band  - 1 x daily - 7 x weekly - 2 sets - 10 reps - Clamshell with Resistance  - 1 x daily - 7 x weekly - 2 sets - 10 reps - Quadruped Rocking Slow  - 1 x daily - 7 x weekly - 2 sets - 10 reps - Quadruped Alternating Arm Lift  - 1 x daily - 7 x weekly - 2 sets - 10 reps  09/08/24 - Standing 3-Way Kick  - 2 x daily - 7 x weekly - 1 sets - 3 reps - 5 hold  ASSESSMENT:  CLINICAL IMPRESSION: Per Bokshan patient is clear for  all activity as long as she is not painful.   Pt is progressed well towards POC.  Began session on treadmill with minimal limp that improved with cueing for mechanics and able to complete with no gait deficits.  Progressed functional strength this session, pt able to complete reciprocal pattern with stairs, good control and no HR required. SLS is difficult, added vector stance for stability, pt able to complete with good form and added to HEP with printout given and verbalized understanding.  Since pt is progressing so well we discussed plans following discharge, pt is a [employer] member and plans to return maybe in December.  Added leg press with educated for importance of slow controlled movements and to look for moderate weight.  Plan to show additional machines available at gym next session.  Added planks for core stability that was difficult for pt, educated on mechanics and form with ability to hold for 10 max.  No reports of pain through session.  Feel pt will be ready for discharge in next 2 weeks if not sooner.   Eval: Patient referred to PT post op hip labrum repair on left 07/16/24. Overall doing well up to this point and she is WBAT. I reviewed post op precautions with her today and she will follow back up with surgeon next week. Patient will benefit from skilled PT to improve  overall function and to address impairments and limitations listed below.  OBJECTIVE IMPAIRMENTS: decreased activity tolerance for ADL's, difficulty walking, decreased balance, decreased endurance, decreased mobility, decreased ROM, decreased strength, impaired flexibility, impaired LE use, and pain.  ACTIVITY LIMITATIONS: bending, liftting, walking, standing, cleaning, community activity, driving,   PERSONAL FACTORS: see above PMH  also affecting patient's functional outcome.  REHAB POTENTIAL: Good  CLINICAL DECISION MAKING: Stable/uncomplicated  EVALUATION COMPLEXITY: Low    GOALS: Short term PT Goals Target date: 08/18/2024   Pt will be I and compliant with HEP. Baseline: Reports compliance with HEP Goal status: in progress  2. Pt will improve 5 times sit to stand test to less than 25 seconds  Baseline 47 sec; 08/19/24: 5STS 11.82  Goal status:in progress Long term PT goals Target date:09/15/2024   Pt will improve left hip AROM to [facility] to improve functional mobility Baseline: Goal status: in progress Pt will improve left LE  strength to at least 4+/5 MMT to improve functional strength Baseline: Goal status: in progress Pt will improve Patient specific functional scale (PSFS) to at least 7/10 to show improved function level Baseline: Improved from 4.5 to 8/10 Goal status: in progress Pt will reduce pain to overall less than 3/10 with usual activity and with ambulating community distances Baseline: 08/19/24:  Average pain scale is 1/10 depending on activity Goal status: in progress Pt will be improve 5 times sit to stand test to less than 13 seconds Baseline:47 sec; 5STS 11.82 Goal status: in progress  PLAN: PT FREQUENCY: 1-2 times per week   PT DURATION: 6-8 weeks  PLANNED INTERVENTIONS  97110-Therapeutic exercises, 97530- Therapeutic activity, 97112- Neuromuscular re-education, 97535- Self Care, 02859- Manual therapy, and Patient/Family education  PLAN FOR  NEXT SESSION: per Dr. Genelle patient is clear for all activity that is painfree; may be ready for discharge early.  Next session work on floor to standing, trial on elliptical and prepare for gym with machines.   Augustin Mclean, LPTA/CLT; CBIS [phone]  3:02 PM, 09/09/24

## 2024-09-16 ENCOUNTER — Ambulatory Visit (HOSPITAL_COMMUNITY)

## 2024-09-22 ENCOUNTER — Encounter (HOSPITAL_COMMUNITY): Payer: Self-pay

## 2024-09-22 ENCOUNTER — Ambulatory Visit (HOSPITAL_COMMUNITY)

## 2024-09-22 DIAGNOSIS — M6281 Muscle weakness (generalized): Secondary | ICD-10-CM

## 2024-09-22 DIAGNOSIS — R600 Localized edema: Secondary | ICD-10-CM

## 2024-09-22 DIAGNOSIS — R262 Difficulty in walking, not elsewhere classified: Secondary | ICD-10-CM

## 2024-09-22 DIAGNOSIS — M25552 Pain in left hip: Secondary | ICD-10-CM | POA: Diagnosis not present

## 2024-09-22 NOTE — Therapy (Signed)
 OUTPATIENT PHYSICAL THERAPY TREATMENT/DISCHARGE PHYSICAL THERAPY DISCHARGE SUMMARY  Visits from Start of Care: 14  Current functional level related to goals / functional outcomes: See below   Remaining deficits: See below   Education / Equipment: HEP   Patient agrees to discharge. Patient goals were met. Patient is being discharged due to meeting the stated rehab goals.      Patient Name: Rhonda Porter MRN: [id number] DOB:Feb 09, 1971, 53 y.o., female Today's Date: 09/22/2024  END OF SESSION:  PT End of Session - 09/22/24 1544     Visit Number 14    Number of Visits 15    Date for Recertification  09/15/24    Authorization Type Aetna    Authorization Time Period no auth needed    PT Start Time 1545           09/09/24 1501  PT Visits / Re-Eval  Visit Number 13  Number of Visits 15  Date for Recertification  09/15/24  Authorization  Authorization Type Aetna  Authorization Time Period no auth needed  PT Time Calculation  PT Start Time 1502  PT Stop Time 1543  PT Time Calculation (min) 41 min  PT - End of Session  Activity Tolerance Patient tolerated treatment well  Behavior During Therapy WFL for tasks assessed/performed      Past Medical History:  Diagnosis Date   Diverticula of colon    Ganglion cyst    Gastritis    History of colonic polyps    Hypertension    on 03/08/15 pt denies high bp in over 10 yrs    IBS (irritable bowel syndrome)    Melanoma (HCC) 10/29/2004   seasonal allergies    Past Surgical History:  Procedure Laterality Date   ABDOMINAL HYSTERECTOMY  10/29/1997   CARPAL TUNNEL RELEASE Right    COLONOSCOPY  03/08/2015   FOOT SURGERY Bilateral    surg x 3 right, surg x 1 left   GLUTEUS MINIMUS REPAIR Left 07/30/2022   Procedure: LEFT GLUTEUS MEDIUS REPAIR;  Surgeon: Genelle Standing, MD;  Location: [facility] OR;  Service: Orthopedics;  Laterality: Left;   KNEE ARTHROSCOPY Right 10/30/2011   MELANOMA EXCISION  10/29/2004   left shoulder    SPINAL FUSION     UPPER GASTROINTESTINAL ENDOSCOPY  03/08/2015   Dr.Pyrtle   WISDOM TOOTH EXTRACTION     Patient Active Problem List   Diagnosis Date Noted   Tear of left gluteus medius tendon    Lower abdominal pain 04/11/2017   Menopausal symptom 04/11/2017   Pain in pelvis 04/11/2017   LUQ abdominal pain 02/10/2015   Bloating 02/08/2015   Irritable bowel syndrome (IBS) 02/08/2015   Abdominal cramps 02/08/2015   DIVERTICULAR BLEEDING, HX OF 01/18/2009   HYPERTENSION 01/17/2009   History of colonic polyps 01/17/2009    PCP: Dow Longs, PA-C   REFERRING PROVIDER: Genelle Standing, MD Next apt: 08/27/24   REFERRING DIAG: D26.807J (ICD-10-CM) - Tear of left acetabular labrum, initial encounter   Rationale for Evaluation and Treatment:  Rehabiliation  THERAPY DIAG:  Pain in left hip  Muscle weakness (generalized)  Difficulty walking  Localized edema  ONSET DATE: post op hip labrum repair on left 07/16/24 *see protocol under media or on Amy L's desk   SUBJECTIVE:  SUBJECTIVE STATEMENT: *per Dr. Genelle she is free of restrictions* 09/22/24:  No issues with Lt hip, has been compliant with HEP.  Feels she has improvements by 95% since beginning therapy.  Reports she has a boil on buttocks, decreased sitting tolerance.    EVAL:She had hip labral repair. Since she is now doing okay, WBAT. She did take meds before coming.   PERTINENT HISTORY:  See above PMH Had a left glute med repair 10/23 per Bokshan  PAIN:  NPRS scale: 4/10 upon arrival Pain location:left hip around incision Pain description: constant sore Aggravating factors: standing too long Relieving factors: rest, meds, ice   PRECAUTIONS: ,  Hip post op protocol-  [email or web address]   RED FLAGS: None   WEIGHT BEARING RESTRICTIONS:  WBAT  FALLS:  Has patient fallen in last 6 months? 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  PATIENT SURVEYS:  Patient-Specific Activity Scoring Scheme  0 represents "unable to perform." 10 represents "able to perform at prior level. 0 1 2 3 4 5 6 7 8 9  10 (Date and Score)  Total Score = Sum of activity scores/number of activities  Minimally Detectable Change: 3 points (for single activity); 2 points (for average score)  [facility] (nd). The Patient Specific Functional Scale . Retrieved from [email or web address]   Activity Eval  08/19/24:  09/22/24  1. walking 3  7 9   2. Getting in/out of bed  6 9  9   3.      4.     5.     Score 4.5 8 9    Total score = sum of the activity scores/number of activities Minimum detectable change (90%CI) for average score = 2 points Minimum detectable change (90%CI) for single activity score = 3 points     EDEMA:  Yes: in left hip  GAIT: Eval: Assistive device utilized: None Level of assistance: Complete Independence Comments: slow ambulation, shorter steps, antalgic gait, limited distances    LOWER EXTREMITY ROM:     Active /PROM Left eval 09/22/24  Hip flexion 70/90 135  Hip extension    Hip abduction 20/30   Hip adduction    Hip internal rotation /30   Hip external rotation /30   Knee flexion    Knee extension    Ankle dorsiflexion    Ankle plantarflexion    Ankle inversion    Ankle eversion     (Blank rows = not tested)   LOWER EXTREMITY MMT:     MMT Left Eval Left  08/19/24 Right 08/19/24 Left 09/22/24 Right 09/22/24  Hip flexion 2 Not tested  4- 4+  Hip extension  2+ limited range 4/5 4/5 4/5  Hip abduction 2 2+ 4+ 4 4+  Hip adduction       Hip internal rotation 2      Hip external rotation 2      Knee flexion 3 3+ 5 5 5   Knee extension 3 3+ 4+    Ankle dorsiflexion  4     Ankle plantarflexion       Ankle inversion       Ankle eversion        (Blank rows = not tested)    FUNCTIONAL TESTS:  Eval: 5 times sit to stand: 47 seconds 353 no AD  TREATMENT DATE:  09/22/24: 421ft no AD Modified for sitting tolerance, squat with chair tap: 7.63 MMT PSFS improved to 9  Floor to standing strategies   11/09/23: Treadmill up to 1.8 spm, grade 3 incline x 5' Retro  .6--> 1.1 speed x 4' Squat chair tapping 2x 10 SLS Rt 11; Lt 33 Vector stance 3x 5 7in reciprocal pattern no HHA Leg press 3Pl 2x 10 Plank 3x 10 cueing for mechanics and form- difficult  09/01/24 Treadmill up to 1.7 mph with 3.0 grade x 5' dynamic warm up Foam beam tandem balance 2 x 30 Calf raises on incline 2 x 10 Toe raises on decline 2 x 10 Slant board 5 x 20 6 step ups 2 x 10 Squat to chair with green theraband around knees 2 x 10     08/25/24 STM to left thigh and hip x 15' to decrease pain and swelling Bike upright x 5' 30 sec left sls 3 x 30 (intermittent hand held assist) Tandem stance 2 x 30 each Walking with increased toe off left leg    PATIENT EDUCATION: Education details: HEP, prognosis, healing, and swelling Person educated: Patient Education method: Explanation, Demonstration, Verbal cues, and Handouts Education comprehension: verbalized understanding, further education recommended   HOME EXERCISE PROGRAM: Access Code: [email or web address] URL: [email or web address] Date:  07/21/2024 Prepared by: Redell Moose  Exercises - Supine Heel Slide with Strap  - 2 x daily - 6 x weekly - 1 sets - 10 reps - Bent Knee Fallouts  - 2 x daily - 6 x weekly - 1 sets - 10 reps - Seated Hip Adduction Isometrics with Ball  - 2 x daily - 6 x weekly - 1 sets - 10 reps - 5 sec hold - Standing March with Counter Support  - 2 x daily - 6 x weekly - 1 sets - 10 reps - Standing Hip Abduction with Counter Support  - 2 x daily - 6 x weekly - 1 sets - 10 reps  Access Code: [email or web address] URL: [email or web address] Date: 09/09/2024 Prepared by: Augustin Mclean  Exercises - Supine Heel Slide with Strap  - 2 x daily - 6 x weekly - 1 sets - 10 reps - Bent Knee Fallouts  - 2 x daily - 6 x weekly - 1 sets - 10 reps - Standing Hip Abduction with Counter Support  - 2 x daily - 6 x weekly - 1 sets - 10 reps - Supine Transversus Abdominis Bracing - Hands on Stomach  - 1 x daily - 7 x weekly - 1 sets - 10 reps - 5 sec hold - Sidelying Reverse Clamshell  - 2 x daily - 7 x weekly - 3 sets - 10 reps - 5 hold - Mini Squat with Chair  - 2 x daily - 7 x weekly - 1 sets - 10 reps - Prone Hip Internal and External Rotation AROM  - 1 x daily - 7 x weekly - 1 sets - 10 reps - Supine Bridge with Resistance Band  - 1 x daily - 7 x weekly - 2 sets - 10 reps - Clamshell with Resistance  - 1 x daily - 7 x weekly - 2 sets - 10 reps - Quadruped Rocking Slow  - 1 x daily - 7 x weekly - 2 sets - 10 reps - Quadruped Alternating Arm Lift  - 1 x daily - 7 x weekly - 2 sets - 10 reps  09/08/24 - Standing 3-Way Kick  - 2 x  daily - 7 x weekly - 1 sets - 3 reps - 5 hold  ASSESSMENT:  CLINICAL IMPRESSION: Reviewed goals with the following findings:  Pt reports compliance with HEP and feels she has improved by 95% since beginning therapy.  Pt has met 2/2 STG and 4/5 LTGs.  Presents with improvements including faster cadence during , improve strength, improved self perceived functional ability and ROM WFL.   Pt able to demonstrate independent floor to standing following demonstration from therapist with no assistance required.  Pt continues to show weakness in hip flexiors and some gluteal mm.  Pt interested in returning to gym.  Pt reports a boil on buttocks with decreased tolerance for sitting today, held on the machines to return to gym and elliptical per pain control, encouraged pt to have instructor at the gym show her machines for safe mechanics with verbalized understanding.  Eval: Patient referred to PT post op hip labrum repair on left 07/16/24. Overall doing well up to this point and she is WBAT. I reviewed post op precautions with her today and she will follow back up with surgeon next week. Patient will benefit from skilled PT to improve overall function and to address impairments and limitations listed below.  OBJECTIVE IMPAIRMENTS: decreased activity tolerance for ADL's, difficulty walking, decreased balance, decreased endurance, decreased mobility, decreased ROM, decreased strength, impaired flexibility, impaired LE use, and pain.  ACTIVITY LIMITATIONS: bending, liftting, walking, standing, cleaning, community activity, driving,   PERSONAL FACTORS: see above PMH  also affecting patient's functional outcome.  REHAB POTENTIAL: Good  CLINICAL DECISION MAKING: Stable/uncomplicated  EVALUATION COMPLEXITY: Low    GOALS: Short term PT Goals Target date: 08/18/2024   Pt will be I and compliant with HEP. Baseline: Reports compliance with HEP Goal status: MET   2. Pt will improve 5 times sit to stand test to less than 25 seconds  Baseline 47 sec; 08/19/24: 5STS 11.82;  09/22/24:  Modified for sitting tolerance, squat with chair tap: 7.63  Goal status: MET Long term PT goals Target date:09/15/2024   Pt will improve left hip AROM to [facility] to improve functional mobility Baseline:  Goal status: MET Pt will improve left LE  strength to at least 4+/5 MMT to improve functional  strength Baseline: see above, continues to have weakness iwht hip flexion Goal status: partiall met Pt will improve Patient specific functional scale (PSFS) to at least 7/10 to show improved function level Baseline: Improved from 4.5 to 8/10; 11/25/235: 9/10 Goal status: MET Pt will reduce pain to overall less than 3/10 with usual activity and with ambulating community distances Baseline: 08/19/24:  Average pain scale is 1/10 depending on activity; 09/22/24:  No reports of pain during gait or ADLs Goal status: MET Pt will be improve 5 times sit to stand test to less than 13 seconds Baseline 47 sec; 08/19/24: 5STS 11.82;  09/22/24:  Modified for sitting tolerance, squat with chair tap: 7.63 Goal status: MEt  PLAN: PT FREQUENCY: 1-2 times per week   PT DURATION: 6-8 weeks  PLANNED INTERVENTIONS  97110-Therapeutic exercises, 97530- Therapeutic activity, 97112- Neuromuscular re-education, 97535- Self Care, 02859- Manual therapy, and Patient/Family education  PLAN FOR NEXT SESSION:  DC to HEP.   Augustin Mclean, LPTA/CLT; CBIS [phone]  3:45 PM, 09/22/24

## 2024-09-23 NOTE — Addendum Note (Signed)
 Addended byBETHA LEODIS NO S on: 09/23/2024 03:13 PM   Modules accepted: Orders

## 2024-11-14 ENCOUNTER — Other Ambulatory Visit: Payer: Self-pay | Admitting: Internal Medicine
